# Patient Record
Sex: Male | Born: 1990 | Race: Black or African American | Hispanic: No | Marital: Single | Smoking: Current every day smoker
Health system: Southern US, Community
[De-identification: ages and names within clinical notes are randomized; demographics above are authoritative.]

## PROBLEM LIST (undated history)

## (undated) DIAGNOSIS — F259 Schizoaffective disorder, unspecified: Secondary | ICD-10-CM

## (undated) DIAGNOSIS — F250 Schizoaffective disorder, bipolar type: Secondary | ICD-10-CM

## (undated) HISTORY — PX: SKIN GRAFT: SHX250

---
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
## 2008-08-15 ENCOUNTER — Emergency Department: Payer: Self-pay | Admitting: Emergency Medicine

## 2016-03-24 ENCOUNTER — Emergency Department
Admission: EM | Admit: 2016-03-24 | Discharge: 2016-03-25 | Disposition: A | Discharge status: Other Acute Inpatient Hospital | Payer: Medicaid Other | Attending: Emergency Medicine | Admitting: Emergency Medicine

## 2016-03-24 DIAGNOSIS — F250 Schizoaffective disorder, bipolar type: Secondary | ICD-10-CM | POA: Diagnosis not present

## 2016-03-24 DIAGNOSIS — Z046 Encounter for general psychiatric examination, requested by authority: Secondary | ICD-10-CM

## 2016-03-24 DIAGNOSIS — F209 Schizophrenia, unspecified: Secondary | ICD-10-CM | POA: Diagnosis not present

## 2016-03-24 DIAGNOSIS — R441 Visual hallucinations: Secondary | ICD-10-CM | POA: Diagnosis present

## 2016-03-24 DIAGNOSIS — F17210 Nicotine dependence, cigarettes, uncomplicated: Secondary | ICD-10-CM | POA: Diagnosis not present

## 2016-03-24 DIAGNOSIS — Z79899 Other long term (current) drug therapy: Secondary | ICD-10-CM | POA: Insufficient documentation

## 2016-03-24 HISTORY — DX: Schizoaffective disorder, bipolar type: F25.0

## 2016-03-24 HISTORY — DX: Schizoaffective disorder, unspecified: F25.9

## 2016-03-24 LAB — CBC
HCT: 45.9 % (ref 40.0–52.0)
HEMOGLOBIN: 15 g/dL (ref 13.0–18.0)
MCH: 26.8 pg (ref 26.0–34.0)
MCHC: 32.7 g/dL (ref 32.0–36.0)
MCV: 81.9 fL (ref 80.0–100.0)
Platelets: 286 10*3/uL (ref 150–440)
RBC: 5.6 MIL/uL (ref 4.40–5.90)
RDW: 14.7 % — ABNORMAL HIGH (ref 11.5–14.5)
WBC: 12.8 10*3/uL — AB (ref 3.8–10.6)

## 2016-03-24 LAB — COMPREHENSIVE METABOLIC PANEL
ALBUMIN: 5.2 g/dL — AB (ref 3.5–5.0)
ALT: 31 U/L (ref 17–63)
ANION GAP: 10 (ref 5–15)
AST: 57 U/L — ABNORMAL HIGH (ref 15–41)
Alkaline Phosphatase: 71 U/L (ref 38–126)
BILIRUBIN TOTAL: 1 mg/dL (ref 0.3–1.2)
BUN: 19 mg/dL (ref 6–20)
CO2: 25 mmol/L (ref 22–32)
Calcium: 10 mg/dL (ref 8.9–10.3)
Chloride: 104 mmol/L (ref 101–111)
Creatinine, Ser: 1.4 mg/dL — ABNORMAL HIGH (ref 0.61–1.24)
GFR calc Af Amer: >60 mL/min (ref >60)
GLUCOSE: 105 mg/dL — AB (ref 65–99)
POTASSIUM: 3.9 mmol/L (ref 3.5–5.1)
Sodium: 139 mmol/L (ref 135–145)
TOTAL PROTEIN: 8.6 g/dL — AB (ref 6.5–8.1)

## 2016-03-24 LAB — ETHANOL

## 2016-03-24 LAB — LITHIUM LEVEL: LITHIUM LVL: 0.1 mmol/L — AB (ref 0.60–1.20)

## 2016-03-24 MED ORDER — RISPERIDONE 1 MG PO TABS
1.0000 mg | ORAL_TABLET | ORAL | Status: DC | PRN
  Administered 2016-03-24 – 2016-03-25 (×2): 1 mg via ORAL
  Filled 2016-03-24 (×3): qty 1

## 2016-03-24 MED ORDER — BENZTROPINE MESYLATE 0.5 MG PO TABS: 1.0000 mg | ORAL_TABLET | Freq: Two times a day (BID) | ORAL | Status: DC | PRN

## 2016-03-24 MED ORDER — LORAZEPAM 2 MG/ML IJ SOLN
INTRAMUSCULAR | Status: AC
  Administered 2016-03-24: 2 mg via INTRAMUSCULAR
  Filled 2016-03-24: qty 1

## 2016-03-24 MED ORDER — LITHIUM CARBONATE 300 MG PO CAPS
900.0000 mg | ORAL_CAPSULE | Freq: Every day | ORAL | Status: DC
  Administered 2016-03-24 – 2016-03-25 (×2): 900 mg via ORAL
  Filled 2016-03-24 (×2): qty 3

## 2016-03-24 MED ORDER — LORAZEPAM 2 MG/ML IJ SOLN
2.0000 mg | Freq: Once | INTRAMUSCULAR | Status: AC
  Administered 2016-03-24: 2 mg via INTRAMUSCULAR

## 2016-03-24 MED ORDER — LORAZEPAM 2 MG PO TABS
2.0000 mg | ORAL_TABLET | ORAL | Status: DC | PRN
  Administered 2016-03-24 – 2016-03-25 (×2): 2 mg via ORAL
  Filled 2016-03-24 (×2): qty 1

## 2016-03-24 MED ORDER — HALOPERIDOL LACTATE 5 MG/ML IJ SOLN
5.0000 mg | Freq: Once | INTRAMUSCULAR | Status: AC
  Administered 2016-03-24: 5 mg via INTRAMUSCULAR
  Filled 2016-03-24: qty 1

## 2016-03-24 MED ORDER — TRAZODONE HCL 50 MG PO TABS: 50.0000 mg | ORAL_TABLET | Freq: Every evening | ORAL | Status: DC | PRN

## 2016-03-24 NOTE — BH Assessment (Signed)
Assessment Note  Ernest Cook is an 25 y.o. male who presents to the ER via Law Enforcement due to going to neighbors' houses, trying to entering them.  Patient have a history of psychosis. He [facility]. Majority of the information for this assessment was provided by them. [facility] [phone]([phone]) states, they seen the patient on last Friday (03/21/2016) and he was giving his IM Injection. He takes Risperdal Consta.  [facility] also states, he hasn't slept in 7 days. When they saw him on Friday, he was behaving differently. They were unsure if he was intoxicated or if it was due to the lack of sleep.  Per [facility], patient have been stable for several years and was doing well. His current behaviors were new. He hasn't been hospitalized in approximately 2 years. Patient is currently living with friends in a trailer home. At one point he was attending the local community college to obtain his GED. To their knowledge, cannabis is the only substance he is using. However, due to his recent mental health and emotional state, they are questioning whether or not it's drug induce, if he's not taking his oral medications, or if not both.  Writer was unable to talk with the patient due to him receiving medications to help him calm down. While in the ER, patient was getting agitated with his nurse because he was needed to "go with the children" and she was prevent him to do so.   At the time of this assessment, patient's UDS hadn't resulted. BAC was negative for alcohol.  Diagnosis: Schizophrenia  Past Medical History:  Past Medical History:  Diagnosis Date  . Schizo affective schizophrenia (HCC)     History reviewed. No pertinent surgical history.  Family History: No family history on file.  Social History:  has no tobacco, alcohol, and drug history on file.  Additional Social History:  Alcohol / Drug Use Pain Medications: See PTA Prescriptions:  See PTA Over the Counter: See PTA History of alcohol / drug use?: No history of alcohol / drug abuse Longest period of sobriety (when/how long): Unknown, patient was unable to share. Negative Consequences of Use:  (Unknown) Withdrawal Symptoms:  (None)  CIWA: CIWA-Ar BP: 115/64 COWS:    Allergies: No Known Allergies  Home Medications:  (Not in a hospital admission)  OB/GYN Status:  No LMP for male patient.  General Assessment Data [facility] Assessment: In system Is this a Tele or Face-to-Face Assessment?: Face-to-Face Is this an Initial Assessment or a Re-assessment for this encounter?: Initial Assessment Marital status: Single Maiden name: n/a Is patient pregnant?: No Pregnancy Status: No Living Arrangements: Non-relatives/Friends (Lives with friend) Can pt return to current living arrangement?: Yes Admission Status: Involuntary Is patient capable of signing voluntary admission?: No Referral Source: Self/Family/Friend Insurance type: Medicaid  Medical Screening Exam [facility] ONLY) Medical Exam completed: Yes  Crisis Care Plan Living Arrangements: Non-relatives/Friends (Lives with friend) Legal Guardian: Other: (None) Name of Psychiatrist: Dr. Janean SarkSandra Simmons  [facility] [facility] Name of Therapist: [facility]  Education Status Is patient currently in school?: No Current Grade: n/a Highest grade of school patient has completed: Didn't complete High School Name of school: n/a Contact person: n/a  Risk to self with the past 6 months Suicidal Ideation: No (Unknown, patient didn't answer. Information from ACT [facility].) Has patient been a risk to self within the past 6 months prior to admission? : Other (comment) (Unknown,  patient didn't answer. Information from ACT Team.) Suicidal Intent:  (Unknown, patient didn't answer. Information from ACT Team.) Has patient had any suicidal intent within the past 6 months prior to admission? :  Other (comment) (Unknown, patient didn't answer. Information from ACT Team.) Is patient at risk for suicide?:  (Unknown, patient didn't answer. Information from ACT Team.) Suicidal Plan?:  (Unknown, patient didn't answer. Information from ACT Team.) Has patient had any suicidal plan within the past 6 months prior to admission? : Other (comment) (Unknown, patient didn't answer. Information from ACT Team.) Access to Means:  (Unknown, patient didn't answer. Information from ACT Team.) What has been your use of drugs/alcohol within the last 12 months?: None Known (Unknown, patient didn't answer. Information from ACT Team.) Previous Attempts/Gestures: No How many times?: 0 Other Self Harm Risks: Unknown, patient didn't answer. Information from ACT Team. Triggers for Past Attempts: Other (Comment) (Unknown, patient didn't answer. Information from ACT Team.) Intentional Self Injurious Behavior:  (Unknown, patient didn't answer. Information from ACT Team.) Family Suicide History:  (Unknown, patient didn't answer. Information from ACT Team.) Recent stressful life event(s): Other (Comment) (SPMI) Persecutory voices/beliefs?:  (Unknown, patient didn't answer. Information from ACT Team.) Depression:  (Unknown, patient didn't answer. Information from ACT Team.) Depression Symptoms:  (Unknown, patient didn't answer. Information from ACT Team.) Substance abuse history and/or treatment for substance abuse?: No  Risk to Others within the past 6 months Homicidal Ideation:  (Unknown, patient didn't answer. Information from ACT Team.) Does patient have any lifetime risk of violence toward others beyond the six months prior to admission? : Unknown (Unknown, patient didn't answer. Information from ACT Team.) Thoughts of Harm to Others: No (Unknown, patient didn't answer. Information from ACT Team.) Current Homicidal Intent:  (Unknown, patient didn't answer. Information from ACT Team.) Current Homicidal Plan:   (Unknown, patient didn't answer. Information from ACT Team.) Access to Homicidal Means:  (Unknown, patient didn't answer. Information from ACT Team.) Identified Victim: Unknown, patient didn't answer. Information from ACT Team. History of harm to others?: No (Unknown, patient didn't answer. Information from ACT Team.) Assessment of Violence: None Noted (Unknown, patient didn't answer. Information from ACT Team.) Violent Behavior Description: Unknown, patient didn't answer. Information from ACT Team. Does patient have access to weapons?: No (Unknown, patient didn't answer. Information from ACT Team.) Criminal Charges Pending?:  (None Known,patient didn't answer. Information from ACT Team.) Does patient have a court date: No Is patient on probation?: No  Psychosis Hallucinations: Visual, Auditory Delusions: Grandiose  Mental Status Report Appearance/Hygiene: In hospital gown, In scrubs, Unremarkable Eye Contact: Unable to Assess (UTA, info provided by ACTT. Pt giving meds to sleep) Motor Activity: Unable to assess (UTA, info provided by ACT[facility]. Pt giving meds to sleep) Speech: Unable to assess (UTA, info provided by ACT[facility]. Pt giving meds to sleep) Level of Consciousness: Unable to assess (UTA, info provided by ACTT. Pt giving meds to sleep) Mood: Other (Comment) (UTA, info provided by ACT[facility]. Pt giving meds to sleep) Affect: Unable to Assess (UTA, info provided by ACTT. Pt giving meds to sleep) Anxiety Level:  (UTA, info provided by ACT[facility]. Pt giving meds to sleep) Thought Processes: Unable to Assess (UTA, info provided by ACT[facility]. Pt giving meds to sleep) Judgement: Unable to Assess (UTA, info provided by ACTT. Pt giving meds to sleep) Orientation: Unable to assess (UTA, info provided by ACT[facility]. Pt giving meds to sleep) Obsessive Compulsive Thoughts/Behaviors: Unable to Assess (UTA, info provided by ACTT. Pt giving meds to sleep)  Cognitive  Functioning Concentration: Unable to Assess (UTA, info  provided by ACT[facility]. Pt giving meds to sleep) Memory: Unable to Assess (UTA, info provided by ACTT. Pt giving meds to sleep) IQ: Average (UTA, info provided by ACTT. Pt giving meds to sleep) Insight: Poor (UTA, info provided by ACTT. Pt giving meds to sleep) Impulse Control: Unable to Assess (UTA, info provided by ACTT. Pt giving meds to sleep) Appetite:  (UTA, info provided by ACT[facility]. Pt giving meds to sleep) Weight Loss:  (UTA, info provided by ACTT. Pt giving meds to sleep) Weight Gain:  (UTA, info provided by ACTT. Pt giving meds to sleep) Sleep: Decreased (UTA, info provided by ACTT. Pt giving meds to sleep) Total Hours of Sleep: 0 (Per ACTT, haven't sleep in 7 days) Vegetative Symptoms: Unable to Assess (UTA, info provided by ACTT. Pt giving meds to sleep)  ADLScreening [facility]) Patient's cognitive ability adequate to safely complete daily activities?: Yes (UTA, info provided by ACTT. Pt giving meds to sleep) Patient able to express need for assistance with ADLs?: Yes (UTA, info provided by ACTT. Pt giving meds to sleep) Independently performs ADLs?: Yes (appropriate for developmental age) (UTA, info provided by ACTT. Pt giving meds to sleep)  Prior Inpatient Therapy Prior Inpatient Therapy: Yes Prior Therapy Dates: Unknown (Unknown, patient didn't answer. Information from ACT Team.) Prior Therapy Facilty/Provider(s): Unknown, patient didn't answer. Information from ACT Team. Reason for Treatment: Schizophrenia  Prior Outpatient Therapy Prior Outpatient Therapy: Yes Prior Therapy Dates: Current Prior Therapy Facilty/Provider(s): [facility] Reason for Treatment: Schizophrenia Does patient have an ACCT team?: Yes Does patient have Intensive In-House Services?  : No Does patient have [facility] services? : No Does patient have P4CC services?: No  ADL Screening (condition at time of admission) Patient's cognitive ability adequate to safely complete daily  activities?: Yes (UTA, info provided by ACTT. Pt giving meds to sleep) Is the patient deaf or have difficulty hearing?: No Does the patient have difficulty seeing, even when wearing glasses/contacts?: No Does the patient have difficulty concentrating, remembering, or making decisions?: No Patient able to express need for assistance with ADLs?: Yes (UTA, info provided by ACTT. Pt giving meds to sleep) Does the patient have difficulty dressing or bathing?: No Independently performs ADLs?: Yes (appropriate for developmental age) (UTA, info provided by ACTT. Pt giving meds to sleep) Does the patient have difficulty walking or climbing stairs?: No Weakness of Legs: None Weakness of Arms/Hands: None  Home Assistive Devices/Equipment Home Assistive Devices/Equipment: None  Therapy Consults (therapy consults require a physician order) PT Evaluation Needed: No OT Evalulation Needed: No SLP Evaluation Needed: No Abuse/Neglect Assessment (Assessment to be complete while patient is alone) Physical Abuse: Denies Verbal Abuse: Denies Sexual Abuse: Denies Exploitation of patient/patient's resources: Denies Self-Neglect: Denies Values / Beliefs Cultural Requests During Hospitalization: None Spiritual Requests During Hospitalization: None Consults Spiritual Care Consult Needed: No Social Work Consult Needed: No      Additional Information 1:1 In Past 12 Months?: No CIRT Risk: No Elopement Risk: No Does patient have medical clearance?: Yes  Child/Adolescent Assessment Running Away Risk: Denies (Patient is an adult)  Disposition:  Disposition Initial Assessment Completed for this Encounter: Yes Disposition of Patient: Other dispositions (ER MD ordered Psych Consult)  On Site Evaluation by:   Reviewed with Physician:    Lilyan Gilford MS, LCAS, LPC, NCC, CCSI Therapeutic Triage Specialist 03/24/2016 12:14 PM

## 2016-03-24 NOTE — ED Notes (Signed)
Patient agitated and delusional, trying to leave and swinging staff, returns to bed with show of force, med IM without restraint.

## 2016-03-24 NOTE — ED Triage Notes (Signed)
[place] [facility] [place] for reports of hallucinations and possible prescription medication non-compliance. Deputy Sheriff stated the pt was walking down the road and up to a random house thinking his daughter was in the house, but the homeowner had no idea who the pt was. [facility] was contacted and IVC papers were taken out by the [facility].

## 2016-03-24 NOTE — ED Notes (Addendum)
Pt dressed out in maroon scrubs by this RN. Belongings searched for weapons/contraband, with none being found. Pt's belongings include 1 pair of sandals, 1 pair of dark blue sweat pants, 1 key chain, and some loose change. Pt's personal items placed in belongings bag and labeled.

## 2016-03-24 NOTE — ED Notes (Signed)
BEHAVIORAL HEALTH ROUNDING Patient sleeping: Yes.   Patient alert and oriented: not applicable Behavior appropriate: Yes.  ; If no, describe:  Nutrition and fluids offered: No Toileting and hygiene offered: No Sitter present: not applicable Law enforcement present: Yes  

## 2016-03-24 NOTE — ED Notes (Signed)
ENVIRONMENTAL ASSESSMENT  Potentially harmful objects out of patient reach: Yes.  Personal belongings secured: Yes.  Patient dressed in hospital provided attire only: Yes.  Plastic bags out of patient reach: Yes.  Patient care equipment (cords, cables, call bells, lines, and drains) shortened, removed, or accounted for: Yes.  Equipment and supplies removed from bottom of stretcher: Yes.  Potentially toxic materials out of patient reach: Yes.  Sharps container removed or out of patient reach: Yes.   BEHAVIORAL HEALTH ROUNDING  Patient sleeping: No.  Patient alert and oriented: yes  Behavior appropriate: Yes. ; If no, describe:  Nutrition and fluids offered: Yes  Toileting and hygiene offered: Yes  Sitter present: not applicable, Q 15 min safety rounds and observation.  Law enforcement present: Yes ODS  [facility] [place] [facility] [place] [facility] [place] [facility] after being found walking in the road and talking about God. Pt talking to no one at this time. Pt has hx of schizophrenia.

## 2016-03-24 NOTE — ED Provider Notes (Signed)
Time Seen: Approximately 459 I have reviewed the triage notes  Chief Complaint: Hallucinations and Medical Clearance   History of Present Illness: Ernest Cook is a 25 y.o. male who presents via local police department with auditory and visual hallucinations. Patient's cooperative does have some rambling speech. His only physical complaints is that his feet are sore from walking. He denies any chest pain headache, lower abdominal pain. She has not been on his medications. He does have a history of schizophrenia   History reviewed. No pertinent past medical history.  There are no active problems to display for this patient.   History reviewed. No pertinent surgical history.  History reviewed. No pertinent surgical history.    Allergies:  Review of patient's allergies indicates not on file.  Family History: No family history on file.  Social History: Social History  Substance Use Topics  . Smoking status: Not on file  . Smokeless tobacco: Not on file  . Alcohol use Not on file     Review of Systems:   10 point review of systems was performed and was otherwise negative:  Constitutional: No fever Eyes: No visual disturbances ENT: No sore throat, ear pain Cardiac: No chest pain Respiratory: No shortness of breath, wheezing, or stridor Abdomen: No abdominal pain, no vomiting, No diarrhea Endocrine: No weight loss, No night sweats Extremities: No peripheral edema, cyanosis Skin: No rashes, easy bruising Neurologic: No focal weakness, trouble with speech or swollowing Urologic: No dysuria, Hematuria, or urinary frequency   Physical Exam:  ED Triage Vitals  Enc Vitals Group     BP 03/24/16 0430 115/64     Pulse --      Resp 03/24/16 0430 16     Temp 03/24/16 0430 98.5 F (36.9 C)     Temp Source 03/24/16 0430 Oral     SpO2 03/24/16 0430 100 %     Weight 03/24/16 0430 200 lb (90.7 kg)     Height 03/24/16 0430 5\' 10"  (1.778 m)     Head Circumference --     Peak Flow --      Pain Score 03/24/16 0431 0     Pain Loc --      Pain Edu? --      Excl. in GC? --     General: Awake , Alert , and Oriented times 3; GCS 15 Head: Normal cephalic , atraumatic Eyes: Pupils equal , round, reactive to light Nose/Throat: No nasal drainage, patent upper airway without erythema or exudate.  Neck: Supple, Full range of motion, No anterior adenopathy or palpable thyroid masses Lungs: Clear to ascultation without wheezes , rhonchi, or rales Heart: Regular rate, regular rhythm without murmurs , gallops , or rubs Abdomen: Soft, non tender without rebound, guarding , or rigidity; bowel sounds positive and symmetric in all 4 quadrants. No organomegaly .        Extremities: 2 plus symmetric pulses. No edema, clubbing or cyanosis Neurologic: normal ambulation, Motor symmetric without deficits, sensory intact Skin: warm, dry, no rashes   Labs:   All laboratory work was reviewed including any pertinent negatives or positives listed below:  Labs Reviewed  CBC - Abnormal; Notable for the following:       Result Value   WBC 12.8 (*)    RDW 14.7 (*)    All other components within normal limits  COMPREHENSIVE METABOLIC PANEL  ETHANOL  URINE DRUG SCREEN, QUALITATIVE ([facility] ONLY)       ED Course: * Patient  has consult established for psychiatry and TTS services. The patient arrives with involuntary commitment paperwork which is been upheld. The patient has had the appropriate paperwork filled out here in emergency department. As stated above he remains cooperative and is currently medically clear for psychiatric evaluation.   Clinical Course     Assessment:  Acute psychosis History of schizophrenia Noncompliant with medication      Plan:  Observed and hold for psychiatric evaluation and recommendation            Jennye Moccasin, MD 03/24/16 [phone]

## 2016-03-24 NOTE — ED Notes (Signed)
Sheriff Deputy states [place]'s [facility] [place].

## 2016-03-24 NOTE — Consult Note (Signed)
[facility] Consult   Reason for Consult:  Consult for 25 year old man with a history of schizoaffective disorder brought in by police Referring Physician:  [place] Patient Identification: Ernest Cook MRN:  [id number] Principal Diagnosis: Schizo affective schizophrenia [facility]) Diagnosis:   Patient Active Problem List   Diagnosis Date Noted  . Schizo affective schizophrenia ([place]) [F25.0] 03/24/2016  . Involuntary commitment [Z04.6] 03/24/2016    Total Time spent with patient: 1 hour  Subjective:   Ernest Cook is a 25 y.o. male patient admitted with "it's all good.".  HPI:  Patient interviewed. Chart reviewed. TTS was able to obtain recent records from his [facility] team. This 25 year old man was brought in by law enforcement after being found in public acting in a bizarre manner. Reportedly walking up to worry into other people's houses that he did not know. On interview today the patient was not able to give much useful history. He answered a couple of questions but then started making comments most of which I didn't understand but some of which seem to be hyper religious or off the topic. After a couple minutes of this he pretended to go to sleep and indicated that he did not want to talk anymore. He was not willing to answer any further questions so I don't know whether he's been using any drugs or alcohol recently. He does claim that he has been staying on his medicine. He is followed by the [employer] act team and they send documentation that he was given his long-acting risperidone shot just this past Friday 3 days ago.  Social history: Patient says he lives by himself in a house. Not sure how reliable this is.  Medical history: Evidently apart from the schizoaffective disorder no known other medical problems.  Substance abuse history: Patient didn't answer questions about this. The notes that we have do not include any mention of substance abuse  problems.  Past Psychiatric History: Patient did not answer as to when he was last in a psychiatric hospital. We do have documentation from [facility] that he is followed there and gets long-acting risperidone also was prescribed lithium. Notes included with their information indicates that he does have a history of explosiveness and anger when psychotic at times in the past. Unknown if he is ever harmed himself. Medications have included long-acting risperidone and lithium as primary medicines.  Risk to Self: Suicidal Ideation: No (Unknown, patient didn't answer. Information from ACT Team.) Suicidal Intent:  (Unknown, patient didn't answer. Information from ACT Team.) Is patient at risk for suicide?:  (Unknown, patient didn't answer. Information from ACT Team.) Suicidal Plan?:  (Unknown, patient didn't answer. Information from ACT Team.) Access to Means:  (Unknown, patient didn't answer. Information from ACT Team.) What has been your use of drugs/alcohol within the last 12 months?: None Known (Unknown, patient didn't answer. Information from ACT Team.) How many times?: 0 Other Self Harm Risks: Unknown, patient didn't answer. Information from ACT Team. Triggers for Past Attempts: Other (Comment) (Unknown, patient didn't answer. Information from ACT Team.) Intentional Self Injurious Behavior:  (Unknown, patient didn't answer. Information from ACT Team.) Risk to Others: Homicidal Ideation:  (Unknown, patient didn't answer. Information from ACT Team.) Thoughts of Harm to Others: No (Unknown, patient didn't answer. Information from ACT Team.) Current Homicidal Intent:  (Unknown, patient didn't answer. Information from ACT Team.) Current Homicidal Plan:  (Unknown, patient didn't answer. Information from ACT Team.) Access to Homicidal Means:  (Unknown, patient didn't answer.  Information from ACT Team.) Identified Victim: Unknown, patient didn't answer. Information from ACT Team. History of harm to  others?: No (Unknown, patient didn't answer. Information from ACT Team.) Assessment of Violence: None Noted (Unknown, patient didn't answer. Information from ACT Team.) Violent Behavior Description: Unknown, patient didn't answer. Information from ACT Team. Does patient have access to weapons?: No (Unknown, patient didn't answer. Information from ACT Team.) Criminal Charges Pending?:  (None Known,patient didn't answer. Information from ACT Team.) Does patient have a court date: No Prior Inpatient Therapy: Prior Inpatient Therapy: Yes Prior Therapy Dates: Unknown (Unknown, patient didn't answer. Information from ACT Team.) Prior Therapy Facilty/Provider(s): Unknown, patient didn't answer. Information from ACT Team. Reason for Treatment: Schizophrenia Prior Outpatient Therapy: Prior Outpatient Therapy: Yes Prior Therapy Dates: Current Prior Therapy Facilty/Provider(s): [place] Reason for Treatment: Schizophrenia Does patient have an ACCT team?: Yes Does patient have Intensive In-House Services?  : No Does patient have [facility] services? : No Does patient have P4CC services?: No  Past Medical History:  Past Medical History:  Diagnosis Date  . Schizo affective schizophrenia ([place])    History reviewed. No pertinent surgical history. Family History: No family history on file. Family Psychiatric  History: No information available Social History:  History  Alcohol use Not on file     History  Drug use: Unknown    Social History   Social History  . Marital status: Single    Spouse name: N/A  . Number of children: N/A  . Years of education: N/A   Social History Main Topics  . Smoking status: None  . Smokeless tobacco: None  . Alcohol use None  . Drug use: Unknown  . Sexual activity: Not Asked   Other Topics Concern  . None   Social History Narrative  . None   Additional Social History:    Allergies:  No Known Allergies  Labs:  Results for orders placed or  performed during the hospital encounter of 03/24/16 (from the past 48 hour(s))  Comprehensive metabolic panel     Status: Abnormal   Collection Time: 03/24/16  4:39 AM  Result Value Ref Range   Sodium 139 135 - 145 mmol/L   Potassium 3.9 3.5 - 5.1 mmol/L   Chloride 104 101 - 111 mmol/L   CO2 25 22 - 32 mmol/L   Glucose, Bld 105 (H) 65 - 99 mg/dL   BUN 19 6 - 20 mg/dL   Creatinine, Ser 1.40 (H) 0.61 - 1.24 mg/dL   Calcium 10.0 8.9 - 10.3 mg/dL   Total Protein 8.6 (H) 6.5 - 8.1 g/dL   Albumin 5.2 (H) 3.5 - 5.0 g/dL   AST 57 (H) 15 - 41 U/L   ALT 31 17 - 63 U/L   Alkaline Phosphatase 71 38 - 126 U/L   Total Bilirubin 1.0 0.3 - 1.2 mg/dL   GFR calc non Af Amer >60 >60 mL/min   GFR calc Af Amer >60 >60 mL/min    Comment: (NOTE) The eGFR has been calculated using the CKD EPI equation. This calculation has not been validated in all clinical situations. eGFR's persistently <60 mL/min signify possible Chronic Kidney Disease.    Anion gap 10 5 - 15  Ethanol     Status: None   Collection Time: 03/24/16  4:39 AM  Result Value Ref Range   Alcohol, Ethyl (B) <5 <5 mg/dL    Comment:        LOWEST DETECTABLE LIMIT FOR SERUM ALCOHOL IS 5 mg/dL  FOR MEDICAL PURPOSES ONLY   cbc     Status: Abnormal   Collection Time: 03/24/16  4:39 AM  Result Value Ref Range   WBC 12.8 (H) 3.8 - 10.6 K/uL   RBC 5.60 4.40 - 5.90 MIL/uL   Hemoglobin 15.0 13.0 - 18.0 g/dL   HCT 45.9 40.0 - 52.0 %   MCV 81.9 80.0 - 100.0 fL   MCH 26.8 26.0 - 34.0 pg   MCHC 32.7 32.0 - 36.0 g/dL   RDW 14.7 (H) 11.5 - 14.5 %   Platelets 286 150 - 440 K/uL  Lithium level     Status: Abnormal   Collection Time: 03/24/16  4:39 AM  Result Value Ref Range   Lithium Lvl 0.10 (L) 0.60 - 1.20 mmol/L    Current Facility-Administered Medications  Medication Dose Route Frequency Provider Last Rate Last Dose  . benztropine (COGENTIN) tablet 1 mg  1 mg Oral BID PRN Gonzella Lex, MD      . lithium carbonate capsule 900 mg  900 mg  Oral QHS Estelle Skibicki T Cleston Lautner, MD      . risperiDONE (RISPERDAL) tablet 1 mg  1 mg Oral Q4H PRN Gonzella Lex, MD      . traZODone (DESYREL) tablet 50 mg  50 mg Oral QHS PRN Gonzella Lex, MD       Current Outpatient Prescriptions  Medication Sig Dispense Refill  . atomoxetine (STRATTERA) 40 MG capsule Take 40 mg by mouth daily.    . benztropine (COGENTIN) 1 MG tablet Take 1 mg by mouth at bedtime.    . docusate sodium (COLACE) 100 MG capsule Take 100 mg by mouth daily as needed for mild constipation.    Marland Kitchen lithium carbonate 300 MG capsule Take 900 mg by mouth at bedtime.    . risperiDONE (RISPERDAL) 1 MG tablet Take 1 mg by mouth daily as needed.    . risperiDONE microspheres (RISPERDAL CONSTA) 50 MG injection Inject 50 mg into the muscle every 14 (fourteen) days.    . traZODone (DESYREL) 50 MG tablet Take 25-50 mg by mouth at bedtime as needed for sleep.      Musculoskeletal: Strength & Muscle Tone: within normal limits Gait & Station: normal Patient leans: N/A  Psychiatric Specialty Exam: Physical Exam  Nursing note and vitals reviewed. Constitutional: He appears well-developed and well-nourished.  HENT:  Head: Normocephalic and atraumatic.  Eyes: Conjunctivae are normal. Pupils are equal, round, and reactive to light.  Neck: Normal range of motion.  Cardiovascular: Normal heart sounds.   Respiratory: Effort normal. No respiratory distress.  GI: Soft.  Musculoskeletal: Normal range of motion.  Neurological: He is alert.  Skin: Skin is warm and dry.  Psychiatric: His affect is blunt. His speech is slurred. He is withdrawn. Cognition and memory are impaired. He expresses impulsivity and inappropriate judgment. He is noncommunicative.  Patient appears to be confused. Answers questions often with non-sequiturs or hyper religious statements. He is inattentive.    Review of Systems  Constitutional: Negative.   HENT: Negative.   Eyes: Negative.   Respiratory: Negative.    Cardiovascular: Negative.   Gastrointestinal: Negative.   Musculoskeletal: Negative.   Skin: Negative.   Neurological: Negative.   Psychiatric/Behavioral: Negative.  Negative for depression, hallucinations, substance abuse and suicidal ideas.    Blood pressure 115/64, temperature 98.5 F (36.9 C), temperature source Oral, resp. rate 16, height '5\' 10"'  (1.778 m), weight 90.7 kg (200 lb), SpO2 100 %.Body mass index is 28.7 kg/m.  General Appearance: Fairly Groomed  Eye Contact:  Fair  Speech:  Slurred  Volume:  Decreased  Mood:  Euthymic  Affect:  Constricted  Thought Process:  Disorganized  Orientation:  Negative  Thought Content:  Illogical and Tangential  Suicidal Thoughts:  No  Homicidal Thoughts:  No  Memory:  Immediate;   Negative Recent;   Negative Remote;   Negative  Judgement:  Impaired  Insight:  Negative  Psychomotor Activity:  Negative  Concentration:  Concentration: Poor  Recall:  Poor  Fund of Knowledge:  Fair  Language:  Fair  Akathisia:  No  Handed:  Right  AIMS (if indicated):     Assets:  Physical Health Social Support  ADL's:  Intact  Cognition:  Impaired,  Mild  Sleep:        Treatment Plan Summary: Daily contact with patient to assess and evaluate symptoms and progress in treatment, Medication management and Plan This is a 25 year old man with a history of schizoaffective disorder who apparently was displaying intrusive psychotic-like behaviors in public. On interview today he is disorganized in his thinking. Unwilling or unable to give much in the way of useful history. Rambling in his speech. So far has not been violent or threatening. Patient meets reasonable criteria for hospitalization given psychotic decompensation. I have restarted his lithium and when necessary oral risperidone and other medicines including when necessary medicine for sleep. Patient has been informed that he will be admitted to the hospital. Reviewed case with TTS and emergency  room physician. Labs reviewed.  Disposition: Recommend psychiatric Inpatient admission when medically cleared. Supportive therapy provided about ongoing stressors.  Alethia Berthold, MD 03/24/2016 3:28 PM

## 2016-03-24 NOTE — Progress Notes (Signed)
TTS attempted to conduct assessment. Patient refused to participate in the assessment and declined to answer questions of the TTS.  Patient was responding to internal stimuli, and having a full conversation.  He kept repeating the statement "Its all baby love to the TTS".

## 2016-03-24 NOTE — ED Notes (Signed)
BEHAVIORAL HEALTH ROUNDING Patient sleeping: No. Patient alert and oriented: no Behavior appropriate: No.; If no, describe: delusional Nutrition and fluids offered: Yes  Toileting and hygiene offered: Yes  Sitter present: not applicable Law enforcement present: Yes

## 2016-03-24 NOTE — ED Notes (Signed)
Up in room. Continues delusional but less hostile and easily redirected.

## 2016-03-24 NOTE — ED Notes (Signed)
Patient increasingly agitated though smiling, thinks he needs to go "with the children", continually leaving room, med for agitation

## 2016-03-24 NOTE — ED Notes (Signed)
BEHAVIORAL HEALTH ROUNDING Patient sleeping: Yes.   Patient alert and oriented: not applicable Behavior appropriate: Yes.  ; If no, describe:  Nutrition and fluids offered: Yes  Toileting and hygiene offered: Yes  Sitter present: not applicable Law enforcement present: Yes  

## 2016-03-24 NOTE — ED Notes (Signed)
BEHAVIORAL HEALTH ROUNDING Patient sleeping: No. Patient alert and oriented: no Behavior appropriate: No.; If no, describe: delusional Nutrition and fluids offered: Yes  Toileting and hygiene offered: Yes  Sitter present: not applicable Law enforcement present: Yes  

## 2016-03-24 NOTE — ED Notes (Signed)
BEHAVIORAL HEALTH ROUNDING  Patient sleeping: No.  Patient alert and oriented: yes  Behavior appropriate: Yes. ; If no, describe:  Nutrition and fluids offered: Yes  Toileting and hygiene offered: Yes  Sitter present: not applicable, Q 15 min safety rounds and observation.  Law enforcement present: Yes ODS  

## 2016-03-24 NOTE — ED Notes (Signed)
BEHAVIORAL HEALTH ROUNDING Patient sleeping: No. Patient alert and oriented: no Behavior appropriate: No.; If no, describe: delusional and disoriented Nutrition and fluids offered: Yes  Toileting and hygiene offered: Yes  Sitter present: not applicable Law enforcement present: Yes

## 2016-03-24 NOTE — BH Assessment (Signed)
Per Dr. Toni Amendlapacs, patient meets inpatient criteria.  [facility] Director [facility](Antoinette B.),due to the [facility] and patient behaviors throughout the day, while in the ER, patient is unable to transfer to the unit at this time. He will be considered again on tomorrow (03/25/2016) for admission.  Writer informed the patient's nurse (Raquel) and ER Charge Nurse Sue Lush(Andrea).

## 2016-03-24 NOTE — ED Notes (Addendum)
Spoke with Darryl, pt's ACT team representative. He reports a medical history of schizophrenia, but unsure of other medical conditions or currently prescribed medications. Darryl states he will fax over what information he has available as soon as feasible. Contact # [phone]

## 2016-03-24 NOTE — ED Notes (Signed)
Received report from Selena BattenKim, Charity fundraiserN. Pt. Sleeping upon shift change, awaiting to move to BHU possibly tomorrow morning depending on his behavior tonight.   Will continue to monitor.

## 2016-03-24 NOTE — ED Notes (Signed)
Unable to question pt for triage; pt only responds with "it's only for love". Deputy states pt has only been speaking about religious topics since being taken into custody.

## 2016-03-24 NOTE — ED Provider Notes (Signed)
-----------------------------------------   8:53 AM on 03/24/2016 -----------------------------------------  Called to bedside patient acting very manic, flights of ideas hyper religiosity talking about Jesus and angels. Entering other patient's rooms. Patient was given Haldol and Ativan. He was not violent but he was confrontational towards staff and an presented a danger to himself and others that moment. We will continue to observe him closely here.   Jeanmarie PlantJames A Delwin Raczkowski, MD 03/24/16 [phone]

## 2016-03-24 NOTE — ED Notes (Signed)
Increased agitation, med po.

## 2016-03-24 NOTE — ED Notes (Signed)

## 2016-03-25 ENCOUNTER — Inpatient Hospital Stay
Admit: 2016-03-25 | Discharge: 2016-04-01 | DRG: 885 | Disposition: A | Discharge status: Home / Self Care | Payer: Medicaid Other | Source: Ambulatory Visit | Attending: Psychiatry | Admitting: Psychiatry

## 2016-03-25 DIAGNOSIS — F1220 Cannabis dependence, uncomplicated: Secondary | ICD-10-CM | POA: Diagnosis present

## 2016-03-25 DIAGNOSIS — F250 Schizoaffective disorder, bipolar type: Principal | ICD-10-CM | POA: Diagnosis present

## 2016-03-25 DIAGNOSIS — F17200 Nicotine dependence, unspecified, uncomplicated: Secondary | ICD-10-CM | POA: Diagnosis present

## 2016-03-25 DIAGNOSIS — G4700 Insomnia, unspecified: Secondary | ICD-10-CM | POA: Diagnosis present

## 2016-03-25 DIAGNOSIS — Z9119 Patient's noncompliance with other medical treatment and regimen: Secondary | ICD-10-CM

## 2016-03-25 DIAGNOSIS — Z9114 Patient's other noncompliance with medication regimen: Secondary | ICD-10-CM | POA: Diagnosis not present

## 2016-03-25 DIAGNOSIS — F17210 Nicotine dependence, cigarettes, uncomplicated: Secondary | ICD-10-CM | POA: Diagnosis present

## 2016-03-25 DIAGNOSIS — Z046 Encounter for general psychiatric examination, requested by authority: Secondary | ICD-10-CM

## 2016-03-25 DIAGNOSIS — F209 Schizophrenia, unspecified: Secondary | ICD-10-CM | POA: Diagnosis not present

## 2016-03-25 DIAGNOSIS — Z91199 Patient's noncompliance with other medical treatment and regimen due to unspecified reason: Secondary | ICD-10-CM

## 2016-03-25 LAB — URINE DRUG SCREEN, QUALITATIVE (ARMC ONLY)
AMPHETAMINES, UR SCREEN: NOT DETECTED
BENZODIAZEPINE, UR SCRN: POSITIVE — AB
Barbiturates, Ur Screen: NOT DETECTED
Cannabinoid 50 Ng, Ur ~~LOC~~: POSITIVE — AB
Cocaine Metabolite,Ur ~~LOC~~: NOT DETECTED
MDMA (ECSTASY) UR SCREEN: NOT DETECTED
Methadone Scn, Ur: NOT DETECTED
Opiate, Ur Screen: NOT DETECTED
Phencyclidine (PCP) Ur S: NOT DETECTED
TRICYCLIC, UR SCREEN: NOT DETECTED

## 2016-03-25 MED ORDER — TRAZODONE HCL 50 MG PO TABS: 50.0000 mg | ORAL_TABLET | Freq: Every evening | ORAL | Status: DC | PRN

## 2016-03-25 MED ORDER — LITHIUM CARBONATE 300 MG PO CAPS: 900.0000 mg | ORAL_CAPSULE | Freq: Every day | ORAL | Status: DC

## 2016-03-25 MED ORDER — ALUM & MAG HYDROXIDE-SIMETH 200-200-20 MG/5ML PO SUSP: 30.0000 mL | ORAL | Status: DC | PRN

## 2016-03-25 MED ORDER — MAGNESIUM HYDROXIDE 400 MG/5ML PO SUSP: 30.0000 mL | Freq: Every day | ORAL | Status: DC | PRN

## 2016-03-25 MED ORDER — RISPERIDONE 1 MG PO TABS
1.0000 mg | ORAL_TABLET | ORAL | Status: DC | PRN
  Filled 2016-03-25: qty 1

## 2016-03-25 MED ORDER — LORAZEPAM 2 MG PO TABS
2.0000 mg | ORAL_TABLET | ORAL | Status: DC | PRN
  Administered 2016-03-26 (×2): 2 mg via ORAL
  Filled 2016-03-25 (×2): qty 1

## 2016-03-25 MED ORDER — RISPERIDONE 1 MG PO TABS
1.0000 mg | ORAL_TABLET | Freq: Two times a day (BID) | ORAL | Status: DC
  Administered 2016-03-26: 1 mg via ORAL
  Filled 2016-03-25: qty 1

## 2016-03-25 MED ORDER — ACETAMINOPHEN 325 MG PO TABS: 650.0000 mg | ORAL_TABLET | Freq: Four times a day (QID) | ORAL | Status: DC | PRN

## 2016-03-25 MED ORDER — BENZTROPINE MESYLATE 1 MG PO TABS
1.0000 mg | ORAL_TABLET | Freq: Two times a day (BID) | ORAL | Status: DC | PRN
  Administered 2016-03-29: 1 mg via ORAL
  Filled 2016-03-25: qty 1

## 2016-03-25 MED ORDER — RISPERIDONE 1 MG PO TABS
1.0000 mg | ORAL_TABLET | Freq: Two times a day (BID) | ORAL | Status: DC
  Administered 2016-03-25: 1 mg via ORAL

## 2016-03-25 NOTE — ED Notes (Signed)
Patient up to restroom at this time

## 2016-03-25 NOTE — ED Notes (Signed)
Patient in bathroom at this time.

## 2016-03-25 NOTE — BH Assessment (Signed)
[facility] Dr. Toni Amendlapacs.  Attending Physician will be Dr. Jennet MaduroPucilowska.   Patient has been [facility] to room 311, [facility] Abi.   Intake Paper Work has been signed and placed on patient chart.  ER staff is aware of the admission (Dr. Scotty CourtStafford,, ER MD; Erie NoeVanessa, Patient's Nurse & Nicole Cellaorothy, Patient Access).

## 2016-03-25 NOTE — ED Notes (Signed)
Ernest DraftsLaura Cook [facility] here to visit pt. She said she would talk to pt's doctor and have her call Dr. Toni Amendlapacs.

## 2016-03-25 NOTE — Consult Note (Signed)
[facility] Consult   Reason for Consult:  Consult for 25 year old man with a history of schizoaffective disorder brought in by police Referring Physician:  McShane Patient Identification: Ernest Cook MRN:  [id number] Principal Diagnosis: Schizo affective schizophrenia [facility]) Diagnosis:   Patient Active Problem List   Diagnosis Date Noted  . Schizo affective schizophrenia ([place]) [F25.0] 03/24/2016  . Involuntary commitment [Z04.6] 03/24/2016    Total Time spent with patient: 1 hour  Subjective:   Ernest Cook is a 25 y.o. male patient admitted with "it's all good.".   Follow-up for this 25 year old man with a history of schizophrenia or schizoaffective disorder. Patient was seen yesterday in the emergency room after being brought into the hospital psychotic. Decision was made to admit him to the psychiatric hospital. Concern was expressed by treatment team about possible agitation. On reevaluation today the patient denies any wish to harm anyone. Today he has not been physically aggressive although at times he has been walking out of his room. Has responded okay to verbal redirection. On at least one occasion earlier today I heard him make a whooping sound but reviewed today he is calm. Thoughts are still disorganized. Affect euthymic. Has been taking medication  HPI:  Patient interviewed. Chart reviewed. TTS was able to obtain recent records from his [place] team. This 25 year old man was brought in by law enforcement after being found in public acting in a bizarre manner. Reportedly walking up to worry into other people's houses that he did not know. On interview today the patient was not able to give much useful history. He answered a couple of questions but then started making comments most of which I didn't understand but some of which seem to be hyper religious or off the topic. After a couple minutes of this he pretended to go to sleep and indicated that  he did not want to talk anymore. He was not willing to answer any further questions so I don't know whether he's been using any drugs or alcohol recently. He does claim that he has been staying on his medicine. He is followed by the [employer] act team and they send documentation that he was given his long-acting risperidone shot just this past Friday 3 days ago.  Social history: Patient says he lives by himself in a house. Not sure how reliable this is.  Medical history: Evidently apart from the schizoaffective disorder no known other medical problems.  Substance abuse history: Patient didn't answer questions about this. The notes that we have do not include any mention of substance abuse problems.  Past Psychiatric History: Patient did not answer as to when he was last in a psychiatric hospital. We do have documentation from [facility] that he is followed there and gets long-acting risperidone also was prescribed lithium. Notes included with their information indicates that he does have a history of explosiveness and anger when psychotic at times in the past. Unknown if he is ever harmed himself. Medications have included long-acting risperidone and lithium as primary medicines.  Risk to Self: Suicidal Ideation: No (Unknown, patient didn't answer. Information from ACT Team.) Suicidal Intent:  (Unknown, patient didn't answer. Information from ACT Team.) Is patient at risk for suicide?:  (Unknown, patient didn't answer. Information from ACT Team.) Suicidal Plan?:  (Unknown, patient didn't answer. Information from ACT Team.) Access to Means:  (Unknown, patient didn't answer. Information from ACT Team.) What has been your use of drugs/alcohol within the last 12 months?:  None Known (Unknown, patient didn't answer. Information from ACT Team.) How many times?: 0 Other Self Harm Risks: Unknown, patient didn't answer. Information from ACT Team. Triggers for Past Attempts: Other (Comment) (Unknown,  patient didn't answer. Information from ACT Team.) Intentional Self Injurious Behavior:  (Unknown, patient didn't answer. Information from ACT Team.) Risk to Others: Homicidal Ideation:  (Unknown, patient didn't answer. Information from ACT Team.) Thoughts of Harm to Others: No (Unknown, patient didn't answer. Information from ACT Team.) Current Homicidal Intent:  (Unknown, patient didn't answer. Information from ACT Team.) Current Homicidal Plan:  (Unknown, patient didn't answer. Information from ACT Team.) Access to Homicidal Means:  (Unknown, patient didn't answer. Information from ACT Team.) Identified Victim: Unknown, patient didn't answer. Information from ACT Team. History of harm to others?: No (Unknown, patient didn't answer. Information from ACT Team.) Assessment of Violence: None Noted (Unknown, patient didn't answer. Information from ACT Team.) Violent Behavior Description: Unknown, patient didn't answer. Information from ACT Team. Does patient have access to weapons?: No (Unknown, patient didn't answer. Information from ACT Team.) Criminal Charges Pending?:  (None Known,patient didn't answer. Information from ACT Team.) Does patient have a court date: No Prior Inpatient Therapy: Prior Inpatient Therapy: Yes Prior Therapy Dates: Unknown (Unknown, patient didn't answer. Information from ACT Team.) Prior Therapy Facilty/Provider(s): Unknown, patient didn't answer. Information from ACT Team. Reason for Treatment: Schizophrenia Prior Outpatient Therapy: Prior Outpatient Therapy: Yes Prior Therapy Dates: Current Prior Therapy Facilty/Provider(s): [place] Reason for Treatment: Schizophrenia Does patient have an ACCT team?: Yes Does patient have Intensive In-House Services?  : No Does patient have [facility] services? : No Does patient have P4CC services?: No  Past Medical History:  Past Medical History:  Diagnosis Date  . Schizo affective schizophrenia ([place])    History  reviewed. No pertinent surgical history. Family History: No family history on file. Family Psychiatric  History: No information available Social History:  History  Alcohol use Not on file     History  Drug use: Unknown    Social History   Social History  . Marital status: Single    Spouse name: N/A  . Number of children: N/A  . Years of education: N/A   Social History Main Topics  . Smoking status: None  . Smokeless tobacco: None  . Alcohol use None  . Drug use: Unknown  . Sexual activity: Not Asked   Other Topics Concern  . None   Social History Narrative  . None   Additional Social History:    Allergies:  No Known Allergies  Labs:  Results for orders placed or performed during the hospital encounter of 03/24/16 (from the past 48 hour(s))  Comprehensive metabolic panel     Status: Abnormal   Collection Time: 03/24/16  4:39 AM  Result Value Ref Range   Sodium 139 135 - 145 mmol/L   Potassium 3.9 3.5 - 5.1 mmol/L   Chloride 104 101 - 111 mmol/L   CO2 25 22 - 32 mmol/L   Glucose, Bld 105 (H) 65 - 99 mg/dL   BUN 19 6 - 20 mg/dL   Creatinine, Ser 1.40 (H) 0.61 - 1.24 mg/dL   Calcium 10.0 8.9 - 10.3 mg/dL   Total Protein 8.6 (H) 6.5 - 8.1 g/dL   Albumin 5.2 (H) 3.5 - 5.0 g/dL   AST 57 (H) 15 - 41 U/L   ALT 31 17 - 63 U/L   Alkaline Phosphatase 71 38 - 126 U/L   Total Bilirubin 1.0 0.3 -  1.2 mg/dL   GFR calc non Af Amer >60 >60 mL/min   GFR calc Af Amer >60 >60 mL/min    Comment: (NOTE) The eGFR has been calculated using the CKD EPI equation. This calculation has not been validated in all clinical situations. eGFR's persistently <60 mL/min signify possible Chronic Kidney Disease.    Anion gap 10 5 - 15  Ethanol     Status: None   Collection Time: 03/24/16  4:39 AM  Result Value Ref Range   Alcohol, Ethyl (B) <5 <5 mg/dL    Comment:        LOWEST DETECTABLE LIMIT FOR SERUM ALCOHOL IS 5 mg/dL FOR MEDICAL PURPOSES ONLY   cbc     Status: Abnormal    Collection Time: 03/24/16  4:39 AM  Result Value Ref Range   WBC 12.8 (H) 3.8 - 10.6 K/uL   RBC 5.60 4.40 - 5.90 MIL/uL   Hemoglobin 15.0 13.0 - 18.0 g/dL   HCT 45.9 40.0 - 52.0 %   MCV 81.9 80.0 - 100.0 fL   MCH 26.8 26.0 - 34.0 pg   MCHC 32.7 32.0 - 36.0 g/dL   RDW 14.7 (H) 11.5 - 14.5 %   Platelets 286 150 - 440 K/uL  Lithium level     Status: Abnormal   Collection Time: 03/24/16  4:39 AM  Result Value Ref Range   Lithium Lvl 0.10 (L) 0.60 - 1.20 mmol/L  Urine Drug Screen, Qualitative     Status: Abnormal   Collection Time: 03/24/16  7:24 AM  Result Value Ref Range   Tricyclic, Ur Screen NONE DETECTED NONE DETECTED   Amphetamines, Ur Screen NONE DETECTED NONE DETECTED   MDMA (Ecstasy)Ur Screen NONE DETECTED NONE DETECTED   Cocaine Metabolite,Ur [place] NONE DETECTED NONE DETECTED   Opiate, Ur Screen NONE DETECTED NONE DETECTED   Phencyclidine (PCP) Ur S NONE DETECTED NONE DETECTED   Cannabinoid 50 Ng, Ur [place] POSITIVE (A) NONE DETECTED   Barbiturates, Ur Screen NONE DETECTED NONE DETECTED   Benzodiazepine, Ur Scrn POSITIVE (A) NONE DETECTED   Methadone Scn, Ur NONE DETECTED NONE DETECTED    Comment: (NOTE) 449  Tricyclics, urine               Cutoff 1000 ng/mL 200  Amphetamines, urine             Cutoff 1000 ng/mL 300  MDMA (Ecstasy), urine           Cutoff 500 ng/mL 400  Cocaine Metabolite, urine       Cutoff 300 ng/mL 500  Opiate, urine                   Cutoff 300 ng/mL 600  Phencyclidine (PCP), urine      Cutoff 25 ng/mL 700  Cannabinoid, urine              Cutoff 50 ng/mL 800  Barbiturates, urine             Cutoff 200 ng/mL 900  Benzodiazepine, urine           Cutoff 200 ng/mL 1000 Methadone, urine                Cutoff 300 ng/mL 1100 1200 The urine drug screen provides only a preliminary, unconfirmed 1300 analytical test result and should not be used for non-medical 1400 purposes. Clinical consideration and professional judgment should 1500 be applied to any positive  drug screen result due to possible 1600 interfering  substances. A more specific alternate chemical method 1700 must be used in order to obtain a confirmed analytical result.  1800 Gas chromato graphy / mass spectrometry (GC/MS) is the preferred 1900 confirmatory method.     Current Facility-Administered Medications  Medication Dose Route Frequency Provider Last Rate Last Dose  . benztropine (COGENTIN) tablet 1 mg  1 mg Oral BID PRN Gonzella Lex, MD      . lithium carbonate capsule 900 mg  900 mg Oral QHS Gonzella Lex, MD   900 mg at 03/24/16 2149  . LORazepam (ATIVAN) tablet 2 mg  2 mg Oral Q4H PRN Gonzella Lex, MD   2 mg at 03/25/16 1109  . risperiDONE (RISPERDAL) tablet 1 mg  1 mg Oral Q4H PRN Gonzella Lex, MD   1 mg at 03/25/16 1445  . risperiDONE (RISPERDAL) tablet 1 mg  1 mg Oral BID Gonzella Lex, MD      . traZODone (DESYREL) tablet 50 mg  50 mg Oral QHS PRN Gonzella Lex, MD       Current Outpatient Prescriptions  Medication Sig Dispense Refill  . atomoxetine (STRATTERA) 40 MG capsule Take 40 mg by mouth daily.    . benztropine (COGENTIN) 1 MG tablet Take 1 mg by mouth at bedtime.    . docusate sodium (COLACE) 100 MG capsule Take 100 mg by mouth daily as needed for mild constipation.    Marland Kitchen lithium carbonate 300 MG capsule Take 900 mg by mouth at bedtime.    . risperiDONE (RISPERDAL) 1 MG tablet Take 1 mg by mouth daily as needed.    . risperiDONE microspheres (RISPERDAL CONSTA) 50 MG injection Inject 50 mg into the muscle every 14 (fourteen) days.    . traZODone (DESYREL) 50 MG tablet Take 25-50 mg by mouth at bedtime as needed for sleep.      Musculoskeletal: Strength & Muscle Tone: within normal limits Gait & Station: normal Patient leans: N/A  Psychiatric Specialty Exam: Physical Exam  Nursing note and vitals reviewed. Constitutional: He appears well-developed and well-nourished.  HENT:  Head: Normocephalic and atraumatic.  Eyes: Conjunctivae are normal.  Pupils are equal, round, and reactive to light.  Neck: Normal range of motion.  Cardiovascular: Normal heart sounds.   Respiratory: Effort normal. No respiratory distress.  GI: Soft.  Musculoskeletal: Normal range of motion.  Neurological: He is alert.  Skin: Skin is warm and dry.  Psychiatric: His affect is blunt. His speech is slurred. He is withdrawn. Cognition and memory are impaired. He expresses impulsivity and inappropriate judgment. He is noncommunicative.  Patient appears to be confused. Answers questions often with non-sequiturs or hyper religious statements. He is inattentive.    Review of Systems  Constitutional: Negative.   HENT: Negative.   Eyes: Negative.   Respiratory: Negative.   Cardiovascular: Negative.   Gastrointestinal: Negative.   Musculoskeletal: Negative.   Skin: Negative.   Neurological: Negative.   Psychiatric/Behavioral: Negative.  Negative for depression, hallucinations, substance abuse and suicidal ideas.    Blood pressure (!) 139/92, pulse 90, temperature 98 F (36.7 C), temperature source Oral, resp. rate 18, height '5\' 10"'  (1.778 m), weight 90.7 kg (200 lb), SpO2 100 %.Body mass index is 28.7 kg/m.  General Appearance: Fairly Groomed  Eye Contact:  Fair  Speech:  Slurred  Volume:  Decreased  Mood:  Euthymic  Affect:  Constricted  Thought Process:  Disorganized  Orientation:  Negative  Thought Content:  Illogical and Tangential  Suicidal Thoughts:  No  Homicidal Thoughts:  No  Memory:  Immediate;   Negative Recent;   Negative Remote;   Negative  Judgement:  Impaired  Insight:  Negative  Psychomotor Activity:  Negative  Concentration:  Concentration: Poor  Recall:  Poor  Fund of Knowledge:  Fair  Language:  Fair  Akathisia:  No  Handed:  Right  AIMS (if indicated):     Assets:  Physical Health Social Support  ADL's:  Intact  Cognition:  Impaired,  Mild  Sleep:        Treatment Plan Summary: Daily contact with patient to assess  and evaluate symptoms and progress in treatment, Medication management and Plan Patient was schizoaffective disorder. Medicine compliant in the hospital. Calm her now. Not requiring one-to-one attention. Not aggressive or violent today. My thought would be that he would be appropriate to admit to the psychiatric ward at this point. I have added a low dose of standing risperidone for psychosis. Continue other medicine. Orders are still in place for admission. Case reviewed with TTS and emergency room doctor.  Disposition: Recommend psychiatric Inpatient admission when medically cleared. Supportive therapy provided about ongoing stressors.  Alethia Berthold, MD 03/25/2016 5:16 PM

## 2016-03-25 NOTE — ED Notes (Signed)
Pt called me to room. States that he wants to leave er now. I told him he was going to go to the behavioral unit sometime today. Pt stated that he wanted to go to BMU now. Said sitting around was making him anxious. I asked him if he needed medication to help with his anxiety. He said he wanted med and ativan  given.

## 2016-03-25 NOTE — ED Provider Notes (Signed)
-----------------------------------------   6:45 AM on 03/25/2016 -----------------------------------------   Blood pressure 139/82, pulse (!) 101, temperature 98.4 F (36.9 C), resp. rate 18, height 5\' 10"  (1.778 m), weight 200 lb (90.7 kg), SpO2 100 %.  The patient had no acute events since last update.  Calm and cooperative at this time.  Patient was accepted to the behavioral medicine unit pending bed availability.   Irean HongJade J Sung, MD 03/25/16 [phone]

## 2016-03-25 NOTE — ED Notes (Signed)
Pt with increasing religious speech, pacing, anxiety and agitation. Pt has not been aggressive. Risperidone  given.

## 2016-03-26 DIAGNOSIS — F1220 Cannabis dependence, uncomplicated: Secondary | ICD-10-CM | POA: Diagnosis present

## 2016-03-26 DIAGNOSIS — F250 Schizoaffective disorder, bipolar type: Principal | ICD-10-CM

## 2016-03-26 DIAGNOSIS — F17200 Nicotine dependence, unspecified, uncomplicated: Secondary | ICD-10-CM | POA: Diagnosis present

## 2016-03-26 LAB — HEMOGLOBIN A1C: Hgb A1c MFr Bld: 6 % (ref 4.0–6.0)

## 2016-03-26 LAB — TSH: TSH: 1.818 u[IU]/mL (ref 0.350–4.500)

## 2016-03-26 LAB — LIPID PANEL
Cholesterol: 158 mg/dL (ref 0–200)
HDL: 29 mg/dL — ABNORMAL LOW (ref >40)
LDL CALC: 102 mg/dL — AB (ref 0–99)
Total CHOL/HDL Ratio: 5.4 RATIO
Triglycerides: 134 mg/dL (ref <150)
VLDL: 27 mg/dL (ref 0–40)

## 2016-03-26 MED ORDER — RISPERIDONE 1 MG PO TABS
2.0000 mg | ORAL_TABLET | Freq: Two times a day (BID) | ORAL | Status: DC
  Administered 2016-03-26 – 2016-04-01 (×11): 2 mg via ORAL
  Filled 2016-03-26 (×10): qty 2

## 2016-03-26 MED ORDER — TEMAZEPAM 15 MG PO CAPS
15.0000 mg | ORAL_CAPSULE | Freq: Every day | ORAL | Status: DC
  Administered 2016-03-27 – 2016-03-31 (×5): 15 mg via ORAL
  Filled 2016-03-26 (×5): qty 1

## 2016-03-26 MED ORDER — LITHIUM CARBONATE 300 MG PO CAPS
600.0000 mg | ORAL_CAPSULE | Freq: Three times a day (TID) | ORAL | Status: DC
  Administered 2016-03-26 – 2016-03-27 (×4): 600 mg via ORAL
  Filled 2016-03-26 (×5): qty 2

## 2016-03-26 MED ORDER — NICOTINE 21 MG/24HR TD PT24
21.0000 mg | MEDICATED_PATCH | Freq: Every day | TRANSDERMAL | Status: DC
  Filled 2016-03-26 (×3): qty 1

## 2016-03-26 NOTE — [facility] Group Notes (Signed)
[facility] Group Notes:  (Nursing/MHT/Case Management/Adjunct)  Date:  03/26/2016  Time:  4:54 PM  Type of Therapy:  Psychoeducational Skills  Participation Level:  Active  Participation Quality:  Appropriate  Affect:  Appropriate  Cognitive:  Appropriate  Insight:  Appropriate  Engagement in Group:  Engaged and Supportive  Modes of Intervention:  Discussion and Education  Summary of Progress/Problems:  Noelle PennerKristen J Damia Bobrowski 03/26/2016, 4:54 PM

## 2016-03-26 NOTE — Progress Notes (Signed)
Recreation Therapy Notes  Date: 08.09.17 Time: 9:30 am Location: Craft Room  Group Topic: Self-esteem  Goal Area(s) Addresses:  Patient will write at least one positive trait about self. Patient will verbalize benefit of having healthy self-esteem.  Behavioral Response: Attentive, Interactive  Intervention: I Am  Activity: Patients were given a worksheet with the letter I on it and instructed to write as many positive traits about themselves inside the letter.  Education: LRT educated patients on ways they can increase their self-esteem.  Education Outcome: Acknowledges education/In group clarification offered   Clinical Observations/Feedback: Patient wrote names and drew on his worksheet. Patient contributed to group discussion by stating how he can increase his self-esteem. Patient talked about his dream of having a lot of dogs and rolling around the yard with them.  Jacquelynn CreeGreene,Ezell Melikian M, LRT/CTRS 03/26/2016 10:32 AM
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
## 2016-03-26 NOTE — Progress Notes (Signed)
Patient with increasing agitation and Ativan 2 mg po prn administered at this time.
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
## 2016-03-26 NOTE — Tx Team (Signed)
Initial Interdisciplinary Treatment Plan   PATIENT STRESSORS: Substance abuse   PATIENT STRENGTHS: Capable of independent living General fund of knowledge Physical Health   PROBLEM LIST: Problem List/Patient Goals Date to be addressed Date deferred Reason deferred Estimated date of resolution  Psychosis 03/25/16     Aggression 03/25/16           "just getting myself together and going home" 03/25/16                                    DISCHARGE CRITERIA:  Improved stabilization in mood, thinking, and/or behavior Motivation to continue treatment in a less acute level of care Need for constant or close observation no longer present Reduction of life-threatening or endangering symptoms to within safe limits Verbal commitment to aftercare and medication compliance  PRELIMINARY DISCHARGE PLAN: Attend aftercare/continuing care group Outpatient therapy  PATIENT/FAMIILY INVOLVEMENT: This treatment plan has been presented to and reviewed with the patient, Oneita Krasevis M Steiner, and/or family member.  The patient and family have been given the opportunity to ask questions and make suggestions.  Chancey Cullinane L Parsells 03/26/2016, 1:55 AM

## 2016-03-26 NOTE — Plan of Care (Signed)
Problem: Education: Goal: Emotional status will improve Outcome: Not Progressing Patient new admit as of late last night and patient states his mood is bette and he is ready to discharge. Patient with poor insight rt issues rt admission.

## 2016-03-26 NOTE — [facility] Suicide Risk Assessment (Signed)
CSW attempted to contact pt's aunt, Frazier Richardslene Russell @ [phone]. CSW left voicemail and is awaiting call back.   Lynden OxfordKadijah R. Maron Stanzione, MSW, LCSW-A 03/26/16  10:16AM
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
## 2016-03-26 NOTE — H&P (Signed)
Psychiatric Admission Assessment Adult  Patient Identification: Ernest Cook MRN:  [id number] Date of Evaluation:  03/26/2016 Chief Complaint:  Schizophrenia Principal Diagnosis: Schizoaffective disorder, bipolar type (HCC) Diagnosis:   Patient Active Problem List   Diagnosis Date Noted  . Schizoaffective disorder, bipolar type (HCC) [F25.0] 03/25/2016    Priority: High  . Cannabis use disorder, moderate, dependence (HCC) [F12.20] 03/26/2016  . Tobacco use disorder [F17.200] 03/26/2016  . Involuntary commitment [Z04.6] 03/24/2016   History of Present Illness:   Identifying data. Ernest Cook is a 25 year old male with a history of schizophrenia.  Chief complaint. "It's for love."  History of present illness. Information was obtained from the patient and the chart. The patient has a long history of schizophrenia but has not been able to provide any details as he is a poor historian and preoccupied with his delusions. He was brought to the emergency room by police after he was found walking to other people's houses looking for his daughter. They were all strangers and reported him to the police. In the emergency room the patient was initially agitated and was held in the ER for couple days before he settled down. He admits that for the past 2 weeks he has not been taking his lithium. He cannot explain why. Lithium level on admission was very low. He has been on Risperdal injections and reportedly got his last injection 3 days prior to admission. He complains that Risperdal has not been working and that he needs to take pills on the top of injections. He denies any symptoms of depression, anxiety, or psychosis. He explains that he is looking for love, and this is all that matters. He is a homosexual and has not been in a relationship. He was positive for marijuana on admission but denies using alcohol or other illicit drugs.  Past psychiatric history. The patient is unable to provide any details.  We know that he has [facility] ACT team and has been on lithium and Risperdal. He is unable to provide any information about his hospitalizations.  Family psychiatric history. Unknown.  Social history. He is disabled from mental illness. He lives independently in a house in [place]. He is gay. He has support from his mother and father as well as his ACT team.  Total Time spent with patient: 1 hour  Is the patient at risk to self? No.  Has the patient been a risk to self in the past 6 months? No.  Has the patient been a risk to self within the distant past? No.  Is the patient a risk to others? No.  Has the patient been a risk to others in the past 6 months? No.  Has the patient been a risk to others within the distant past? No.   Prior Inpatient Therapy:   Prior Outpatient Therapy:    Alcohol Screening: 1. How often do you have a drink containing alcohol?: Never 2. How many drinks containing alcohol do you have on a typical day when you are drinking?: 1 or 2 3. How often do you have six or more drinks on one occasion?: Never Preliminary Score: 0 4. How often during the last year have you found that you were not able to stop drinking once you had started?: Never 5. How often during the last year have you failed to do what was normally expected from you becasue of drinking?: Never 6. How often during the last year have you needed a first drink in the morning to  get yourself going after a heavy drinking session?: Never 7. How often during the last year have you had a feeling of guilt of remorse after drinking?: Never 8. How often during the last year have you been unable to remember what happened the night before because you had been drinking?: Never 9. Have you or someone else been injured as a result of your drinking?: No 10. Has a relative or friend or a doctor or another health worker been concerned about your drinking or suggested you cut down?: No Alcohol Use Disorder  Identification Test Final Score (AUDIT): 0 Brief Intervention: AUDIT score less than 7 or less-screening does not suggest unhealthy drinking-brief intervention not indicated Substance Abuse History in the last 12 months:  Yes.   Consequences of Substance Abuse: Negative Previous Psychotropic Medications: Yes  Psychological Evaluations: No  Past Medical History:  Past Medical History:  Diagnosis Date  . Schizo affective schizophrenia [facility])     Past Surgical History:  Procedure Laterality Date  . SKIN GRAFT Left unknown   Pt reports left foot and leg skin graft   Family History: History reviewed. No pertinent family history.  Tobacco Screening: Have you used any form of tobacco in the last 30 days? (Cigarettes, Smokeless Tobacco, Cigars, and/or Pipes): Yes Tobacco use, Select all that apply: 4 or less cigarettes per day Are you interested in Tobacco Cessation Medications?: No, patient refused Counseled patient on smoking cessation including recognizing danger situations, developing coping skills and basic information about quitting provided: Refused/Declined practical counseling Social History:  History  Alcohol Use No     History  Drug Use  . Types: Marijuana    Comment: "every now and again"    Additional Social History: Marital status: Single Are you sexually active?: No What is your sexual orientation?: Gay  Has your sexual activity been affected by drugs, alcohol, medication, or emotional stress?: N/A Does patient have children?: No    Pain Medications: see PTA meds Prescriptions: see PTA meds Over the Counter: see PTA meds History of alcohol / drug use?: No history of alcohol / drug abuse                    Allergies:  No Known Allergies Lab Results:  Results for orders placed or performed during the hospital encounter of 03/25/16 (from the past 48 hour(s))  Hemoglobin A1c     Status: None   Collection Time: 03/26/16  7:30 AM  Result Value Ref Range    Hgb A1c MFr Bld 6.0 4.0 - 6.0 %  Lipid panel     Status: Abnormal   Collection Time: 03/26/16  7:30 AM  Result Value Ref Range   Cholesterol 158 0 - 200 mg/dL   Triglycerides 161 <096 mg/dL   HDL 29 (L) >04 mg/dL   Total CHOL/HDL Ratio 5.4 RATIO   VLDL 27 0 - 40 mg/dL   LDL Cholesterol 540 (H) 0 - 99 mg/dL    Comment:        Total Cholesterol/HDL:CHD Risk Coronary Heart Disease Risk Table                     Men   Women  1/2 Average Risk   3.4   3.3  Average Risk       5.0   4.4  2 X Average Risk   9.6   7.1  3 X Average Risk  23.4   11.0  Use the calculated Patient Ratio above and the CHD Risk Table to determine the patient's CHD Risk.        ATP III CLASSIFICATION (LDL):  <100     mg/dL   Optimal  161-096  mg/dL   Near or Above                    Optimal  130-159  mg/dL   Borderline  045-409  mg/dL   High  >811     mg/dL   Very High   TSH     Status: None   Collection Time: 03/26/16  7:30 AM  Result Value Ref Range   TSH 1.818 0.350 - 4.500 uIU/mL    Blood Alcohol level:  Lab Results  Component Value Date   ETH <5 03/24/2016    Metabolic Disorder Labs:  Lab Results  Component Value Date   HGBA1C 6.0 03/26/2016   No results found for: PROLACTIN Lab Results  Component Value Date   CHOL 158 03/26/2016   TRIG 134 03/26/2016   HDL 29 (L) 03/26/2016   CHOLHDL 5.4 03/26/2016   VLDL 27 03/26/2016   LDLCALC 102 (H) 03/26/2016    Current Medications: Current Facility-Administered Medications  Medication Dose Route Frequency Provider Last Rate Last Dose  . acetaminophen (TYLENOL) tablet 650 mg  650 mg Oral Q6H PRN Audery Amel, MD      . alum & mag hydroxide-simeth (MAALOX/MYLANTA) 200-200-20 MG/5ML suspension 30 mL  30 mL Oral Q4H PRN Audery Amel, MD      . benztropine (COGENTIN) tablet 1 mg  1 mg Oral BID PRN Audery Amel, MD      . lithium carbonate capsule 600 mg  600 mg Oral TID AC Beckie Viscardi B Naje Rice, MD   600 mg at 03/26/16 1107  .  LORazepam (ATIVAN) tablet 2 mg  2 mg Oral Q4H PRN Audery Amel, MD      . magnesium hydroxide (MILK OF MAGNESIA) suspension 30 mL  30 mL Oral Daily PRN Audery Amel, MD      . nicotine (NICODERM CQ - dosed in mg/24 hours) patch 21 mg  21 mg Transdermal Daily Dontai Pember B Rogan Wigley, MD      . risperiDONE (RISPERDAL) tablet 1 mg  1 mg Oral Q4H PRN Audery Amel, MD      . risperiDONE (RISPERDAL) tablet 2 mg  2 mg Oral BID Huberta Tompkins B Annsley Akkerman, MD      . temazepam (RESTORIL) capsule 15 mg  15 mg Oral QHS Lamara Brecht B Nevea Spiewak, MD       PTA Medications: Prescriptions Prior to Admission  Medication Sig Dispense Refill Last Dose  . atomoxetine (STRATTERA) 40 MG capsule Take 40 mg by mouth daily.   unknown at unknown  . benztropine (COGENTIN) 1 MG tablet Take 1 mg by mouth at bedtime.   unknown at unknown  . docusate sodium (COLACE) 100 MG capsule Take 100 mg by mouth daily as needed for mild constipation.   prn at prn  . lithium carbonate 300 MG capsule Take 900 mg by mouth at bedtime.   unkown at unknown  . risperiDONE (RISPERDAL) 1 MG tablet Take 1 mg by mouth daily as needed.   prn at prn  . risperiDONE microspheres (RISPERDAL CONSTA) 50 MG injection Inject 50 mg into the muscle every 14 (fourteen) days.   03/21/2016 at unknown  . traZODone (DESYREL) 50 MG tablet Take 25-50 mg by mouth at bedtime as  needed for sleep.   prn at prn    Musculoskeletal: Strength & Muscle Tone: within normal limits Gait & Station: normal Patient leans: N/A  Psychiatric Specialty Exam: Physical Exam  Nursing note and vitals reviewed. Constitutional: He is oriented to person, place, and time. He appears well-developed and well-nourished.  HENT:  Head: Normocephalic and atraumatic.  Eyes: Conjunctivae and EOM are normal. Pupils are equal, round, and reactive to light.  Neck: Normal range of motion. Neck supple.  Cardiovascular: Normal rate, regular rhythm and normal heart sounds.   Respiratory: Effort normal  and breath sounds normal.  GI: Soft. Bowel sounds are normal.  Musculoskeletal: Normal range of motion.  Neurological: He is alert and oriented to person, place, and time.  Skin: Skin is warm and dry.    Review of Systems  Psychiatric/Behavioral: Positive for hallucinations.  All other systems reviewed and are negative.   Blood pressure (S) (!) 174/105, pulse (!) 103, temperature 98.4 F (36.9 C), temperature source Oral, resp. rate 18, height 5\' 10"  (1.778 m), weight 92.1 kg (203 lb), SpO2 100 %.Body mass index is 29.13 kg/m.  See SRA.                                                  Sleep:  Number of Hours: 4.25       Treatment Plan Summary: Daily contact with patient to assess and evaluate symptoms and progress in treatment and Medication management   Ernest Cook is a 25 year old male with history of schizophrenia admitted floridly psychotic in the context of medication noncompliance.  1. Agitated behavior. The patient presented with agitation but it has resolved. Ativan is available.  2. Mood and psychosis. The patient has been treated with a combination of lithium and Risperdal. Lithium level was low on admission. He reports he received Risperdal Consta injection 3 days prior to admission. We restarted lithium and oral Risperdal.  3. Metabolic syndrome monitoring. Lipid panel, TSH, hemoglobin A1c and prolactin are pending.  4. Smoking. Nicotine patch is available.  5. Insomnia. He did not sleep well with trazodone will start Restoril tonight.  6. Disposition. The patient will be discharged to home. [facility]   Observation Level/Precautions:  15 minute checks  Laboratory:  CBC Chemistry Profile UDS UA  Psychotherapy:    Medications:    Consultations:    Discharge Concerns:    Estimated LOS:  Other:     I certify that inpatient services furnished can reasonably be expected to improve the patient's  condition.    Kristine LineaJolanta Cecilio Ohlrich, MD 8/9/20174:50 PM

## 2016-03-26 NOTE — Progress Notes (Signed)
Pt blood pressure elevated this morning. Blood pressure taken manually with a result of 172/98. MD on call notified.

## 2016-03-26 NOTE — Progress Notes (Addendum)
Patient with blunted affect, cooperative behavior with meals and meds. No SI/HI/AVH at this time. Patient with poor insight and reports his mood is bette and that he is ready to discharge today. Meets with Child psychotherapistsocial worker. Therapy groups encouraged, safety maintained. Social with select peer. Patient with no aggression. Attends group and cooperative with meds.

## 2016-03-26 NOTE — Tx Team (Signed)
Interdisciplinary Treatment Plan Update (Adult)         Date: 03/26/2016   Time Reviewed: 10:30 AM   Progress in Treatment: Improving Attending groups: Yes  Participating in groups: Yes  Taking medication as prescribed: Yes  Tolerating medication: Yes  Family/Significant other contact made: CSW awaiting return call from collateral  Patient understands diagnosis: Yes  Discussing patient identified problems/goals with staff: Yes  Medical problems stabilized or resolved: Yes  Denies suicidal/homicidal ideation: Yes  Issues/concerns per patient self-inventory: Yes  Other:   New problem(s) identified: N/A   Discharge Plan or Barriers: see below   Reason for Continuation of Hospitalization:   Depression   Anxiety   Medication Stabilization   Comments: N/A   Estimated length of stay: 7 days    Patient is a 25 year old male admitted for bizarre behavior. Patient lives in [place], [place].   Patient was seen yesterday in the emergency room after being brought into the hospital psychotic. Decision was made to admit him to the psychiatric hospital. Concern was expressed by treatment team about possible agitation. On reevaluation today the patient denies any wish to harm anyone. Today he has not been physically aggressive although at times he has been walking out of his room. Has responded okay to verbal redirection. On at least one occasion earlier today I heard him make a whooping sound but reviewed today he is calm. Thoughts are still disorganized. Affect euthymic. Has been taking medication. Patient will benefit from crisis stabilization, medication evaluation, group therapy, and psycho education in addition to case management for discharge planning. Patient and CSW reviewed pt's identified goals and treatment plan. Pt verbalized understanding and agreed to treatment plan.    Review of initial/current patient goals per problem list:  1. Goal(s): Patient will participate in aftercare  plan   Met: Yes  Target date: 3-5 days post admission date   As evidenced by: Patient will participate within aftercare plan AEB aftercare provider and housing plan at discharge being identified.   Patient will follow-up with [facility] [place], [place].  2. Goal (s): Patient will exhibit decreased depressive symptoms and suicidal ideations.   Met: Goal progressing   Target date: 3-5 days post admission date   As evidenced by: Patient will utilize self-rating of depression at 3 or below and demonstrate decreased signs of depression or be deemed stable for discharge by MD.   Pt reports that he is "love struck" and displays depressive symptoms. Rates depression score of 6 at this time.  3. Goal(s): Patient will demonstrate decreased signs and symptoms of anxiety.   Met: No  Target date: 3-5 days post admission date   As evidenced by: Patient will utilize self-rating of anxiety at 3 or below and demonstrated decreased signs of anxiety, or be deemed stable for discharge by MD   Pt reports an anxiety score of 5 at this time.     4. Goal(s): Patient will demonstrate decreased signs of psychosis  * Met: No * Target date: 3-5 days post admission date  * As evidenced by: Patient will demonstrate decreased frequency of AVH or return to baseline function   Pt is still experiencing psychosis and delusions at this time.  5. Goal (s): Patient will demonstrate decreased signs of mania  * Met: No * Target date: 3-5 days post admission date  * As evidenced by: Patient demonstrate decreased signs of mania AEB decreased mood instability and demonstration of stable mood   Pt mood is  still unstable at this time. Still presents with manic symptoms.  Attendees:  Patient: Ernest Cook Family:  Physician: Orson Slick, MD   03/26/2016 10:30 AM  Nursing: , RN       03/26/2016 10:30 AM  Clinical Social Worker: Emilie Rutter, [place]  03/26/2016 10:30 AM  Recreational Therapist: Everitt Amber                        03/26/2016  10:30AM

## 2016-03-27 LAB — PROLACTIN: Prolactin: 48 ng/mL — ABNORMAL HIGH (ref 4.0–15.2)

## 2016-03-27 MED ORDER — LITHIUM CARBONATE 300 MG PO CAPS
1800.0000 mg | ORAL_CAPSULE | Freq: Every day | ORAL | Status: DC
  Administered 2016-03-28 – 2016-03-30 (×3): 1800 mg via ORAL
  Filled 2016-03-27 (×3): qty 6

## 2016-03-27 NOTE — [facility] Group Notes (Signed)
[facility] LCSW Group Therapy  03/27/2016 3:38 PM  Type of Therapy:  Group Therapy  Participation Level:  None  Participation Quality:  Drowsy  Affect:  Flat and Lethargic  Cognitive:  Lacking  Insight:  None  Engagement in Therapy:  None  Modes of Intervention:  Discussion, Education and Support  Summary of Progress/Problems: Balance in life: Patients will discuss the concept of balance and how it looks and feels to be unbalanced. Pt will identify areas in their life that is unbalanced and ways to become more balanced. Pt initially came to group on this date but left group after 5 minutes stating "I'm too tired can I leave?" Pt left group and did not return.   Krithika Tome G. Garnette CzechSampson MSW, LCSWA 03/27/2016, 3:47 PM

## 2016-03-27 NOTE — Progress Notes (Signed)
D: Patient is drowsy  on the unit this shift. Patient not attended and actively participated in groups today. Patient denies suicidal ideation, homicidal ideation, auditory or visual hallucinations at the present time.  A: Scheduled medications are administered to patient as per MD orders. Emotional support and encouragement are provided. Patient is maintained on q.15 minute safety checks. Patient is informed to notify staff with questions or concerns. R: No adverse medication reactions are noted. Patient is cooperative with medication administration  . Patient is non receptive due to sedation, calm and cooperative on the unit at this time. Patient does not interact   with others on the unit this shift,pt isolative. Patient contracts for safety at this time. Patient remains safe at this time.

## 2016-03-27 NOTE — Progress Notes (Signed)
[facility]  03/27/2016 6:18 PM Ernest Cook  MRN:  [id number]  Subjective:  [name] remains floridly psychotic and delusional but it is more interactive today. He no longer talks about love all the time. He refuses lithium today but promised to take it in the evening.  Principal Problem: Schizoaffective disorder, bipolar type (HCC) Diagnosis:   Patient Active Problem List   Diagnosis Date Noted  . Schizoaffective disorder, bipolar type (HCC) [F25.0] 03/25/2016    Priority: High  . Cannabis use disorder, moderate, dependence (HCC) [F12.20] 03/26/2016  . Tobacco use disorder [F17.200] 03/26/2016  . Involuntary commitment [Z04.6] 03/24/2016   Total Time spent with patient: 20 minutes  Past Psychiatric History: Schizoaffective disorder.  Past Medical History:  Past Medical History:  Diagnosis Date  . Schizo affective schizophrenia [facility](HCC)     Past Surgical History:  Procedure Laterality Date  . SKIN GRAFT Left unknown   Pt reports left foot and leg skin graft   Family History: History reviewed. No pertinent family history. Family Psychiatric  History: See H&P. Social History:  History  Alcohol Use No     History  Drug Use  . Types: Marijuana    Comment: "every now and again"    Social History   Social History  . Marital status: Single    Spouse name: N/A  . Number of children: N/A  . Years of education: N/A   Social History Main Topics  . Smoking status: Current Every Day Smoker    Packs/day: 0.25    Types: Cigarettes  . Smokeless tobacco: Never Used  . Alcohol use No  . Drug use:     Types: Marijuana     Comment: "every now and again"  . Sexual activity: Yes    Birth control/ protection: Condom     Comment: Pt reports he is attracted to men   Other Topics Concern  . None   Social History Narrative  . None   Additional Social History:    Pain Medications: see PTA meds Prescriptions: see PTA meds Over the Counter: see PTA meds History  of alcohol / drug use?: No history of alcohol / drug abuse                    Sleep: Fair  Appetite:  Fair  Current Medications: Current Facility-Administered Medications  Medication Dose Route Frequency Provider Last Rate Last Dose  . acetaminophen (TYLENOL) tablet 650 mg  650 mg Oral Q6H PRN Audery AmelJohn T Clapacs, MD      . alum & mag hydroxide-simeth (MAALOX/MYLANTA) 200-200-20 MG/5ML suspension 30 mL  30 mL Oral Q4H PRN Audery AmelJohn T Clapacs, MD      . benztropine (COGENTIN) tablet 1 mg  1 mg Oral BID PRN Audery AmelJohn T Clapacs, MD      . lithium carbonate capsule 600 mg  600 mg Oral TID AC Jolanta B Pucilowska, MD   600 mg at 03/27/16 1715  . LORazepam (ATIVAN) tablet 2 mg  2 mg Oral Q4H PRN Audery AmelJohn T Clapacs, MD   2 mg at 03/26/16 2223  . magnesium hydroxide (MILK OF MAGNESIA) suspension 30 mL  30 mL Oral Daily PRN Audery AmelJohn T Clapacs, MD      . nicotine (NICODERM CQ - dosed in mg/24 hours) patch 21 mg  21 mg Transdermal Daily Jolanta B Pucilowska, MD      . risperiDONE (RISPERDAL) tablet 1 mg  1 mg Oral Q4H PRN Audery AmelJohn T Clapacs, MD      .  risperiDONE (RISPERDAL) tablet 2 mg  2 mg Oral BID Shari Prows, MD   2 mg at 03/26/16 2225  . temazepam (RESTORIL) capsule 15 mg  15 mg Oral QHS Shari Prows, MD        Lab Results:  Results for orders placed or performed during the hospital encounter of 03/25/16 (from the past 48 hour(s))  Hemoglobin A1c     Status: None   Collection Time: 03/26/16  7:30 AM  Result Value Ref Range   Hgb A1c MFr Bld 6.0 4.0 - 6.0 %  Lipid panel     Status: Abnormal   Collection Time: 03/26/16  7:30 AM  Result Value Ref Range   Cholesterol 158 0 - 200 mg/dL   Triglycerides 161 <096 mg/dL   HDL 29 (L) >04 mg/dL   Total CHOL/HDL Ratio 5.4 RATIO   VLDL 27 0 - 40 mg/dL   LDL Cholesterol 540 (H) 0 - 99 mg/dL    Comment:        Total Cholesterol/HDL:CHD Risk Coronary Heart Disease Risk Table                     Men   Women  1/2 Average Risk   3.4   3.3  Average  Risk       5.0   4.4  2 X Average Risk   9.6   7.1  3 X Average Risk  23.4   11.0        Use the calculated Patient Ratio above and the CHD Risk Table to determine the patient's CHD Risk.        ATP III CLASSIFICATION (LDL):  <100     mg/dL   Optimal  981-191  mg/dL   Near or Above                    Optimal  130-159  mg/dL   Borderline  478-295  mg/dL   High  >621     mg/dL   Very High   Prolactin     Status: Abnormal   Collection Time: 03/26/16  7:30 AM  Result Value Ref Range   Prolactin 48.0 (H) 4.0 - 15.2 ng/mL    Comment: (NOTE) Performed At: [facility] [address] [place], [place] [id number] Mila Homer MD [id number]   TSH     Status: None   Collection Time: 03/26/16  7:30 AM  Result Value Ref Range   TSH 1.818 0.350 - 4.500 uIU/mL    Blood Alcohol level:  Lab Results  Component Value Date   ETH <5 03/24/2016    Metabolic Disorder Labs: Lab Results  Component Value Date   HGBA1C 6.0 03/26/2016   Lab Results  Component Value Date   PROLACTIN 48.0 (H) 03/26/2016   Lab Results  Component Value Date   CHOL 158 03/26/2016   TRIG 134 03/26/2016   HDL 29 (L) 03/26/2016   CHOLHDL 5.4 03/26/2016   VLDL 27 03/26/2016   LDLCALC 102 (H) 03/26/2016    Physical Findings: AIMS: Facial and Oral Movements Muscles of Facial Expression: None, normal Lips and Perioral Area: None, normal Jaw: None, normal Tongue: None, normal,Extremity Movements Upper (arms, wrists, hands, fingers): None, normal Lower (legs, knees, ankles, toes): None, normal, Trunk Movements Neck, shoulders, hips: None, normal, Overall Severity Severity of abnormal movements (highest score from questions above): None, normal Incapacitation due to abnormal movements: None, normal Patient's awareness of abnormal movements (rate only  patient's report): No Awareness, Dental Status Current problems with teeth and/or dentures?: No Does patient usually wear dentures?: No  CIWA:     COWS:     Musculoskeletal: Strength & Muscle Tone: within normal limits Gait & Station: normal Patient leans: N/A  Psychiatric Specialty Exam: Physical Exam  Nursing note and vitals reviewed.   Review of Systems  Psychiatric/Behavioral: Positive for hallucinations.  All other systems reviewed and are negative.   Blood pressure 135/84, pulse 97, temperature 98.3 F (36.8 C), temperature source Oral, resp. rate 18, height  (1.778 m), weight 92.1 kg (203 lb), SpO2 100 %.Body mass index is 29.13 kg/m.  General Appearance: Casual  Eye Contact:  Good  Speech:  Blocked  Volume:  Normal  Mood:  Euphoric  Affect:  Congruent  Thought Process:  Disorganized  Orientation:  Full (Time, Place, and Person)  Thought Content:  Delusions and Paranoid Ideation  Suicidal Thoughts:  No  Homicidal Thoughts:  No  Memory:  Immediate;   Fair Recent;   Fair Remote;   Fair  Judgement:  Poor  Insight:  Lacking  Psychomotor Activity:  Normal  Concentration:  Concentration: Fair and Attention Span: Fair  Recall:  [employer] of Knowledge:  Fair  Language:  Poor  Akathisia:  No  Handed:  Right  AIMS (if indicated):     Assets:  Communication Skills Desire for Improvement Financial Resources/Insurance Housing Physical Health Resilience Social Support  ADL's:  Intact  Cognition:  WNL  Sleep:  Number of Hours: 7.15     Treatment Plan Summary: Daily contact with patient to assess and evaluate symptoms and progress in treatment and Medication management   Ernest Cook is a 25 year old male with history of schizophrenia admitted floridly psychotic in the context of medication noncompliance.  1. Agitated behavior. The patient presented with agitation but it has resolved. Ativan is available.  2. Mood and psychosis. The patient has been treated with a combination of lithium and Risperdal. Lithium level was low on admission. He reports he received Risperdal Consta injection 3 days prior  to admission. We restarted lithium and oral Risperdal. I will order all the Lithium at night today.  3. Metabolic syndrome monitoring. Lipid panel, TSH, hemoglobin A1c and prolactin are pending.  4. Smoking. Nicotine patch is available.  5. Insomnia. He did not sleep well with trazodone will start Restoril tonight.  6. Disposition. The patient will be discharged to home. He will follow-up with [facility] act team.  Kristine Linea, MD 03/27/2016, 6:18 PM

## 2016-03-27 NOTE — [facility] Group Notes (Signed)
Goals Group  Date/Time:  Type of Therapy and Topic: Group Therapy: Goals Group: SMART Goals  ?  Participation Level: Moderate  ?  Description of Group:  ?  The purpose of a daily goals group is to assist and guide patients in setting recovery/wellness-related goals. The objective is to set goals as they relate to the crisis in which they were admitted. Patients will be using SMART goal modalities to set measurable goals. Characteristics of realistic goals will be discussed and patients will be assisted in setting and processing how one will reach their goal. Facilitator will also assist patients in applying interventions and coping skills learned in psycho-education groups to the SMART goal and process how one will achieve defined goal.  ?  Therapeutic Goals:  ?  -Patients will develop and document one goal related to or their crisis in which brought them into treatment.  -Patients will be guided by LCSW using SMART goal setting modality in how to set a measurable, attainable, realistic and time sensitive goal.  -Patients will process barriers in reaching goal.  -Patients will process interventions in how to overcome and successful in reaching goal.  ?  Patient's Goal: Pt's goal today is to "have a good time while he is here." Pt stated that he does not want to be at the hospital but wants to be better, so will invest in his care while he is here. ?  Therapeutic Modalities:  Motivational Interviewing  Cognitive Behavioral Therapy  Crisis Intervention Model  SMART goals setting   Lynden OxfordKadijah R. Javonte Elenes, MSW, LCSW-A 10:43AM  10:43AM

## 2016-03-27 NOTE — Progress Notes (Signed)
Patient's mood was irritable this morning.States "you are giving me over dose."Refused to take his medicines.Stayed in bed and hesitant to talk to staff.Attended groups partially.Stated that he will take lithium at night.Made Dr.P aware of that.Denies suicidal or homicidal ideations & AV hallucinations.

## 2016-03-27 NOTE — Plan of Care (Signed)
Problem: Coping: Goal: Ability to verbalize feelings will improve Outcome: Not Progressing Patient not tooo verbal very drowsy on this shift Tax adviserCTownsend RN

## 2016-03-27 NOTE — [facility] Group Notes (Signed)
[facility] Group Notes:  (Nursing/MHT/Case Management/Adjunct)  Date:  03/27/2016  Time:  3:55 AM  Type of Therapy:  Group Therapy  Participation Level:  Active  Participation Quality:  Appropriate  Affect:  Appropriate  Cognitive:  Appropriate  Insight:  Appropriate  Engagement in Group:  Engaged  Modes of Intervention:  n/a  Summary of Progress/Problems:  Ernest Cook 03/27/2016, 3:55 AM

## 2016-03-27 NOTE — Progress Notes (Signed)
Recreation Therapy Notes  Date: 08.10.17 Time: 9:30 am Location: Craft Room  Group Topic: Leisure Education  Goal Area(s) Addresses:  Patient will identify things they are grateful for. Patient will verbalize why it is important to be grateful.  Behavioral Response: Inattentive, Left early  Intervention: Grateful Wheel  Activity: Patients were given an I Am Grateful For worksheet and instructed to write things they were grateful for under each category.  Education: LRT educated patients on how being grateful can be a positive change in their lives.  Education Outcome: Patient left before LRT educated group.  Clinical Observations/Feedback: Patient laid his head on the table for the majority of group. Patient left group at approximately 10:05 am. Patient did not return to group.  Jacquelynn CreeGreene,Olivia Pavelko M, LRT/CTRS 03/27/2016 10:32 AM

## 2016-03-28 MED ORDER — LORAZEPAM 2 MG/ML IJ SOLN: 2.0000 mg | INTRAMUSCULAR | Status: DC | PRN

## 2016-03-28 MED ORDER — DIPHENHYDRAMINE HCL 25 MG PO CAPS
50.0000 mg | ORAL_CAPSULE | Freq: Four times a day (QID) | ORAL | Status: DC | PRN
  Administered 2016-03-28: 50 mg via ORAL
  Filled 2016-03-28: qty 2

## 2016-03-28 MED ORDER — HALOPERIDOL LACTATE 5 MG/ML IJ SOLN
10.0000 mg | Freq: Four times a day (QID) | INTRAMUSCULAR | Status: DC | PRN
  Filled 2016-03-28: qty 2

## 2016-03-28 MED ORDER — LORAZEPAM 2 MG/ML IJ SOLN
INTRAMUSCULAR | Status: AC
  Filled 2016-03-28: qty 1

## 2016-03-28 MED ORDER — LORAZEPAM 2 MG PO TABS
2.0000 mg | ORAL_TABLET | ORAL | Status: DC | PRN
  Administered 2016-03-28 – 2016-03-29 (×2): 2 mg via ORAL
  Filled 2016-03-28 (×2): qty 1

## 2016-03-28 MED ORDER — DIPHENHYDRAMINE HCL 50 MG/ML IJ SOLN
50.0000 mg | Freq: Four times a day (QID) | INTRAMUSCULAR | Status: DC | PRN
  Filled 2016-03-28: qty 1

## 2016-03-28 MED ORDER — HALOPERIDOL 5 MG PO TABS
10.0000 mg | ORAL_TABLET | Freq: Four times a day (QID) | ORAL | Status: DC | PRN
Start: 2016-03-28 — End: 2016-04-01
  Administered 2016-03-28 – 2016-03-29 (×2): 10 mg via ORAL
  Filled 2016-03-28 (×2): qty 2

## 2016-03-28 NOTE — [facility] Group Notes (Signed)
[facility] Group Notes:  (Nursing/MHT/Case Management/Adjunct)  Date:  03/28/2016  Time:  3:53 PM  Type of Therapy:  Psychoeducational Skills  Participation Level:  Did Not Attend  Lynelle SmokeCara Travis [facility] HospitalMadoni 03/28/2016, 3:53 PM

## 2016-03-28 NOTE — Plan of Care (Signed)
Problem: Coping: Goal: Ability to verbalize feelings will improve Outcome: Not Progressing Patient acted out & put on 1:1 security.

## 2016-03-28 NOTE — [facility] Group Notes (Signed)
[facility] Group Notes:  (Nursing/MHT/Case Management/Adjunct)  Date:  03/28/2016  Time:  3:54 AM  Type of Therapy:  Psychoeducational Skills  Participation Level:  Active  Participation Quality:  Appropriate  Affect:  Appropriate  Cognitive:  Appropriate  Insight:  Appropriate and Good  Engagement in Group:  Engaged  Modes of Intervention:  Discussion, Socialization and Support  Summary of Progress/Problems:  Chancy MilroyLaquanda Y Saeed Toren 03/28/2016, 3:54 AM

## 2016-03-28 NOTE — Plan of Care (Signed)
Problem: Safety: Goal: Ability to remain free from injury will improve Outcome: Progressing Patient has remained free from anger since admission.

## 2016-03-28 NOTE — [facility] Suicide Risk Assessment (Signed)
[facility] INPATIENT:  Family/Significant Other Suicide Prevention Education  Suicide Prevention Education:  Education Completed; grandmother,  Frazier Richardslene Russell @ [phone] has been identified by the patient as the family member/significant other with whom the patient will be residing, and identified as the person(s) who will aid the patient in the event of a mental health crisis (suicidal ideations/suicide attempt).  With written consent from the patient, the family member/significant other has been provided the following suicide prevention education, prior to the and/or following the discharge of the patient. Pt's grandmother spoke with pt who is wanting to discharge but symptoms are not better at this time. Pt is also not taking medications, grandmother is aware.   The suicide prevention education provided includes the following:  Suicide risk factors  Suicide prevention and interventions  National Suicide Hotline telephone number  [facility] assessment telephone number  [facility] Emergency Assistance 911  [facility] and/or Residential Mobile Crisis Unit telephone number  Request made of family/significant other to:  Remove weapons (e.g., guns, rifles, knives), all items previously/currently identified as safety concern.    Remove drugs/medications (over-the-counter, prescriptions, illicit drugs), all items previously/currently identified as a safety concern.  The family member/significant other verbalizes understanding of the suicide prevention education information provided.  The family member/significant other agrees to remove the items of safety concern listed above.  Lynden OxfordKadijah R Bell Cai, MSW, LCSW-A 03/28/2016, 1:19 PM

## 2016-03-28 NOTE — Progress Notes (Signed)
Recreation Therapy Notes  Date: 08.11.17 Time: 1:00 pm Location: Craft Room  Group Topic: Coping Skills  Goal Area(s) Addresses:  Patient will participate in healthy coping skill. Patient will verbalize benefit of using art as a coping skill.  Behavioral Response: Attentive, Left early  Intervention: Coloring  Activity: Patients were given coloring sheets to color and were instructed to think about the emotions they were feeling and what their minds were focused on.  Education: LRT educated patients on healthy coping skills.  Education Outcome: Patient left before LRT educated group.  Clinical Observations/Feedback: Patient colored coloring sheet. Patient left group at approximately 1:45 pm to get water. Patient did not return to group.  Jacquelynn CreeGreene,Aahil Fredin M, LRT/CTRS 03/28/2016 2:29 PM

## 2016-03-28 NOTE — Progress Notes (Signed)
Patient is put 1:1 security for safety.

## 2016-03-28 NOTE — Progress Notes (Signed)
Patient was pleasant this morning.Came to staff and apologized for his action yesterday.Compliant with medications.Attended groups.This afternoon patient got agitated & started acting out.Patient started yelling in the hall way states " I need to go home."Patient was hitting and pushing the doors hardly.He broke the door in the hall way.Forced med ordered and code 300 called.Tried to deescalate the patient.Patient states "I hate needles I will take the pills."Patient took PO medicine & calm down for an hour and 30 mts.After dinner he acted out again.Throwing the trash can bagging the door and trying to break the glasses.Tried to deescalate the patient.Patient calm down & resting in bed.

## 2016-03-28 NOTE — Plan of Care (Signed)
Problem: Coping: Goal: Ability to demonstrate self-control will improve Outcome: Progressing Patient is cooperative and apologized for his action yesterday.

## 2016-03-28 NOTE — [facility] Group Notes (Signed)
[facility] LCSW Group Therapy  03/28/2016 2:55 PM  Type of Therapy:  Group Therapy  Participation Level:  Active  Participation Quality:  Appropriate  Affect:  Appropriate  Cognitive:  Alert  Insight:  Developing/Improving  Engagement in Therapy:  Engaged  Modes of Intervention:  Discussion, Education and Support  Summary of Progress/Problems: Feelings around Relapse. Group members discussed the meaning of relapse and shared personal stories of relapse, how it affected them and others, and how they perceived themselves during this time. Group members were encouraged to identify triggers, warning signs and coping skills used when facing the possibility of relapse. Social supports were discussed and explored in detail. Pt stated he was feeling better and has opened up with his family about his needs. CSW provided support to pt and encouraged his continued progress.   Ernest Cook G. Garnette CzechSampson MSW, LCSWA 03/28/2016, 2:57 PM

## 2016-03-28 NOTE — Progress Notes (Signed)
D: Patient exhibits very bizarre behaviors. He denies SI/HI/AVH but it appears that he is responding to internal stimuli. Patient attended wrap up group.  A: Medication given with education. Encouragement provided.  R: Patient was compliant with medication. He has remained calm and cooperative. Safety maintained with 15 min checks.

## 2016-03-28 NOTE — Progress Notes (Signed)
[facility] Progress Note for Medication Administration to Non-consenting Patients (For Involuntarily Committed Patients)  Patient: Ernest Cook Date of Birth: [id number]/1992 MRN: [id number]  Reason for the Medication: The patient, without the benefit of the specific treatment measure, is incapable of participating in any available treatment plan that will give the patient a realistic opportunity of improving the patient's condition. There is, without the benefit of the specific treatment measure, a significant possibility that the patient will harm self or others before improvement of the patient's condition is realized.  Consideration of Side Effects: Consideration of the side effects related to the medication plan has been given.  Rationale for Medication Administration: This 25 year old man who has psychotic symptoms and agitation became aggressive today with disorganized thinking. He was not responsive to verbal redirection and was initially refusing oral medication. He was showing behavior that was high risk for injury to self or others and was not calming down with appropriate verbal treatment. Emergency administration of medicine was necessary for safety. Furthermore his psychotic symptoms are related to an underlying psychotic disorder that is not likely to improve without medication administration. I approve and recommend the force medicine order.    Ernest RasmussenJohn Talana Slatten, MD 03/28/16  6:54 PM   This documentation is good for (7) seven days from the date of the MD signature. New documentation must be completed every seven (7) days with detailed justification in the medical record if the patient requires continued non-emergent administration of psychotropic medications.

## 2016-03-28 NOTE — Progress Notes (Addendum)
[facility] MD Progress Note  03/28/2016 1:04 PM Ernest Cook  MRN:  [id number]  Subjective:  Ernest Cook is a 25 year old male with history of schizoaffective disorder admitted for psychotic break in the context of medication noncompliance. He has not been refusing Lithium but took Risperdal. He initially promised to take Lithium tonight before agitation.    Today, he remains floridly psychotic and delusional. He was calm in the morning but apparently called his family to pick him up. When he realized he is not going home he became loud and agitated. Security was called. Haldol, Ativan and Benadryl were ordered. We may need forced medications order. I will contact Dr. Toni Amend for second opinion.   Principal Problem: Schizoaffective disorder, bipolar type (HCC) Diagnosis:   Patient Active Problem List   Diagnosis Date Noted  . Schizoaffective disorder, bipolar type (HCC) [F25.0] 03/25/2016    Priority: High  . Cannabis use disorder, moderate, dependence (HCC) [F12.20] 03/26/2016  . Tobacco use disorder [F17.200] 03/26/2016  . Involuntary commitment [Z04.6] 03/24/2016   Total Time spent with patient: 20 minutes  Past Psychiatric History: Schizoaffective disorder.  Past Medical History:  Past Medical History:  Diagnosis Date  . Schizo affective schizophrenia [facility])     Past Surgical History:  Procedure Laterality Date  . SKIN GRAFT Left unknown   Pt reports left foot and leg skin graft   Family History: History reviewed. No pertinent family history. Family Psychiatric  History: See H&P. Social History:  History  Alcohol Use No     History  Drug Use  . Types: Marijuana    Comment: "every now and again"    Social History   Social History  . Marital status: Single    Spouse name: N/A  . Number of children: N/A  . Years of education: N/A   Social History Main Topics  . Smoking status: Current Every Day Smoker    Packs/day: 0.25    Types: Cigarettes  . Smokeless tobacco: Never  Used  . Alcohol use No  . Drug use:     Types: Marijuana     Comment: "every now and again"  . Sexual activity: Yes    Birth control/ protection: Condom     Comment: Pt reports he is attracted to men   Other Topics Concern  . None   Social History Narrative  . None   Additional Social History:    Pain Medications: see PTA meds Prescriptions: see PTA meds Over the Counter: see PTA meds History of alcohol / drug use?: No history of alcohol / drug abuse                    Sleep: Fair  Appetite:  Fair  Current Medications: Current Facility-Administered Medications  Medication Dose Route Frequency Provider Last Rate Last Dose  . acetaminophen (TYLENOL) tablet 650 mg  650 mg Oral Q6H PRN Audery Amel, MD      . alum & mag hydroxide-simeth (MAALOX/MYLANTA) 200-200-20 MG/5ML suspension 30 mL  30 mL Oral Q4H PRN Audery Amel, MD      . benztropine (COGENTIN) tablet 1 mg  1 mg Oral BID PRN Audery Amel, MD      . lithium carbonate capsule 1,800 mg  1,800 mg Oral QHS Lizvet Chunn B Neftaly Swiss, MD      . LORazepam (ATIVAN) tablet 2 mg  2 mg Oral Q4H PRN Audery Amel, MD   2 mg at 03/26/16 2223  . magnesium hydroxide (MILK  OF MAGNESIA) suspension 30 mL  30 mL Oral Daily PRN Audery Amel, MD      . nicotine (NICODERM CQ - dosed in mg/24 hours) patch 21 mg  21 mg Transdermal Daily Eris Breck B Lyndee Herbst, MD      . risperiDONE (RISPERDAL) tablet 1 mg  1 mg Oral Q4H PRN Audery Amel, MD      . risperiDONE (RISPERDAL) tablet 2 mg  2 mg Oral BID Shari Prows, MD   2 mg at 03/28/16 0934  . temazepam (RESTORIL) capsule 15 mg  15 mg Oral QHS Ryllie Nieland B Yanky Vanderburg, MD   15 mg at 03/27/16 2213    Lab Results:  No results found for this or any previous visit (from the past 48 hour(s)).  Blood Alcohol level:  Lab Results  Component Value Date   ETH <5 03/24/2016    Metabolic Disorder Labs: Lab Results  Component Value Date   HGBA1C 6.0 03/26/2016   Lab Results   Component Value Date   PROLACTIN 48.0 (H) 03/26/2016   Lab Results  Component Value Date   CHOL 158 03/26/2016   TRIG 134 03/26/2016   HDL 29 (L) 03/26/2016   CHOLHDL 5.4 03/26/2016   VLDL 27 03/26/2016   LDLCALC 102 (H) 03/26/2016    Physical Findings: AIMS: Facial and Oral Movements Muscles of Facial Expression: None, normal Lips and Perioral Area: None, normal Jaw: None, normal Tongue: None, normal,Extremity Movements Upper (arms, wrists, hands, fingers): None, normal Lower (legs, knees, ankles, toes): None, normal, Trunk Movements Neck, shoulders, hips: None, normal, Overall Severity Severity of abnormal movements (highest score from questions above): None, normal Incapacitation due to abnormal movements: None, normal Patient's awareness of abnormal movements (rate only patient's report): No Awareness, Dental Status Current problems with teeth and/or dentures?: No Does patient usually wear dentures?: No  CIWA:    COWS:     Musculoskeletal: Strength & Muscle Tone: within normal limits Gait & Station: normal Patient leans: N/A  Psychiatric Specialty Exam: Physical Exam  Nursing note and vitals reviewed.   Review of Systems  Psychiatric/Behavioral: Positive for hallucinations.  All other systems reviewed and are negative.   Blood pressure 122/78, pulse 84, temperature 98.1 F (36.7 C), temperature source Oral, resp. rate 18, height  (1.778 m), weight 92.1 kg (203 lb), SpO2 100 %.Body mass index is 29.13 kg/m.  General Appearance: Casual  Eye Contact:  Good  Speech:  Blocked  Volume:  Normal  Mood:  Euphoric  Affect:  Congruent  Thought Process:  Disorganized  Orientation:  Full (Time, Place, and Person)  Thought Content:  Delusions and Paranoid Ideation  Suicidal Thoughts:  No  Homicidal Thoughts:  No  Memory:  Immediate;   Fair Recent;   Fair Remote;   Fair  Judgement:  Poor  Insight:  Lacking  Psychomotor Activity:  Normal  Concentration:   Concentration: Fair and Attention Span: Fair  Recall:  [employer] of Knowledge:  Fair  Language:  Poor  Akathisia:  No  Handed:  Right  AIMS (if indicated):     Assets:  Communication Skills Desire for Improvement Financial Resources/Insurance Housing Physical Health Resilience Social Support  ADL's:  Intact  Cognition:  WNL  Sleep:  Number of Hours: 6.25     Treatment Plan Summary: Daily contact with patient to assess and evaluate symptoms and progress in treatment and Medication management   Mr. Ingham is a 25 year old male with history of schizophrenia admitted floridly psychotic in  the context of medication noncompliance.  1. Agitated behavior. The patient presented with agitation but it has resolved. Ativan is available.  2. Mood and psychosis. The patient has been treated with a combination of lithium and Risperdal. Lithium level was low on admission. He reports he received Risperdal Consta injection 3 days prior to admission. We restarted lithium and oral Risperdal. I will order all the Lithium at night today.  3. Metabolic syndrome monitoring. Lipid panel, TSH, hemoglobin A1c and prolactin are pending.  4. Smoking. Nicotine patch is available.  5. Insomnia. He did not sleep well with trazodone will start Restoril tonight.  6. Disposition. The patient will be discharged to home. [facility].  Kristine LineaJolanta Sheriann Newmann, MD 03/28/2016, 1:04 PM

## 2016-03-29 NOTE — Progress Notes (Signed)
Pt pacing hallways. Security sitter present. Safety maintained.

## 2016-03-29 NOTE — Progress Notes (Addendum)
Pt resting in bed with eyes closed. Security sitter present. Safety maintained. 

## 2016-03-29 NOTE — Plan of Care (Signed)
Problem: Safety: Goal: Periods of time without injury will increase Outcome: Progressing Patient easily becomes agitated and will slam doors and yell, Patient can be redirected more sufficiently if a male speaks with him vs a woman that was noted. Patient does take medication.

## 2016-03-29 NOTE — Progress Notes (Signed)
Patient is now sitting in dayroom interacting with other Patients. Cooperative at this time.

## 2016-03-29 NOTE — Progress Notes (Signed)
Pt resting in bed with eyes closed. Security sitter present. Safety maintained. 

## 2016-03-29 NOTE — Progress Notes (Signed)
  Pt resting in bed with eyes closed. Security sitter present. Safety maintained. 

## 2016-03-29 NOTE — Progress Notes (Signed)
Pt did not participate in any assessment and did not attend evening wrap up group. Affect is irritable and isolative. Pt compliant with evening medications. Denied pain. No interaction with peers and minimal interaction with staff. Security sitter present. Safety maintained. Will continue to monitor.

## 2016-03-29 NOTE — Progress Notes (Signed)
[facility] MD Progress Note  03/29/2016 2:14 PM Gage ELIYA GEIMAN  MRN:  [id number]  Subjective:  Mr. Voit is a 25 year old male with history of schizoaffective disorder admitted for psychotic break in the context of medication noncompliance. He has not been refusing Lithium but took Risperdal. He initially promised to take Lithium tonight before agitation.    As of Saturday the 12th patient interviewed chart reviewed case reviewed with nursing. 25 year old man with schizoaffective disorder. He has no complaints today. His insight is very poor. He remains irritable but he has not been violent or aggressive today. Within the last day he has been aggressive and been breaking property on the unit. Use of one-to-one and security management as well as force medications has improved behavior and safety but he remains psychotic and paranoid. No new physical complaints. Vital signs stable. No sign of bad reaction to medicine.  Principal Problem: Schizoaffective disorder, bipolar type (HCC) Diagnosis:   Patient Active Problem List   Diagnosis Date Noted  . Cannabis use disorder, moderate, dependence (HCC) [F12.20] 03/26/2016  . Tobacco use disorder [F17.200] 03/26/2016  . Schizoaffective disorder, bipolar type (HCC) [F25.0] 03/25/2016  . Involuntary commitment [Z04.6] 03/24/2016   Total Time spent with patient: 20 minutes  Past Psychiatric History: Schizoaffective disorder.  Past Medical History:  Past Medical History:  Diagnosis Date  . Schizo affective schizophrenia [facility])     Past Surgical History:  Procedure Laterality Date  . SKIN GRAFT Left unknown   Pt reports left foot and leg skin graft   Family History: History reviewed. No pertinent family history. Family Psychiatric  History: See H&P. Social History:  History  Alcohol Use No     History  Drug Use  . Types: Marijuana    Comment: "every now and again"    Social History   Social History  . Marital status: Single    Spouse name:  N/A  . Number of children: N/A  . Years of education: N/A   Social History Main Topics  . Smoking status: Current Every Day Smoker    Packs/day: 0.25    Types: Cigarettes  . Smokeless tobacco: Never Used  . Alcohol use No  . Drug use:     Types: Marijuana     Comment: "every now and again"  . Sexual activity: Yes    Birth control/ protection: Condom     Comment: Pt reports he is attracted to men   Other Topics Concern  . None   Social History Narrative  . None   Additional Social History:  Specify valuables returned: Cell phone,wallet,debit card,1 belt and 1 key Pain Medications: see PTA meds Prescriptions: see PTA meds Over the Counter: see PTA meds History of alcohol / drug use?: No history of alcohol / drug abuse                    Sleep: Fair  Appetite:  Fair  Current Medications: Current Facility-Administered Medications  Medication Dose Route Frequency Provider Last Rate Last Dose  . acetaminophen (TYLENOL) tablet 650 mg  650 mg Oral Q6H PRN Audery Amel, MD      . alum & mag hydroxide-simeth (MAALOX/MYLANTA) 200-200-20 MG/5ML suspension 30 mL  30 mL Oral Q4H PRN Audery Amel, MD      . benztropine (COGENTIN) tablet 1 mg  1 mg Oral BID PRN Audery Amel, MD   1 mg at 03/29/16 0917  . diphenhydrAMINE (BENADRYL) capsule 50 mg  50 mg Oral  Q6H PRN Shari Prows, MD   50 mg at 03/28/16 1537   Or  . diphenhydrAMINE (BENADRYL) injection 50 mg  50 mg Intramuscular Q6H PRN Jolanta B Pucilowska, MD      . haloperidol (HALDOL) tablet 10 mg  10 mg Oral Q6H PRN Jolanta B Pucilowska, MD   10 mg at 03/29/16 0916   Or  . haloperidol lactate (HALDOL) injection 10 mg  10 mg Intramuscular Q6H PRN Jolanta B Pucilowska, MD      . lithium carbonate capsule 1,800 mg  1,800 mg Oral QHS Jolanta B Pucilowska, MD   1,800 mg at 03/28/16 2117  . LORazepam (ATIVAN) tablet 2 mg  2 mg Oral Q4H PRN Shari Prows, MD   2 mg at 03/28/16 1537   Or  . LORazepam (ATIVAN)  injection 2 mg  2 mg Intramuscular Q4H PRN Jolanta B Pucilowska, MD      . magnesium hydroxide (MILK OF MAGNESIA) suspension 30 mL  30 mL Oral Daily PRN Audery Amel, MD      . nicotine (NICODERM CQ - dosed in mg/24 hours) patch 21 mg  21 mg Transdermal Daily Jolanta B Pucilowska, MD      . risperiDONE (RISPERDAL) tablet 1 mg  1 mg Oral Q4H PRN Audery Amel, MD      . risperiDONE (RISPERDAL) tablet 2 mg  2 mg Oral BID Shari Prows, MD   2 mg at 03/29/16 0915  . temazepam (RESTORIL) capsule 15 mg  15 mg Oral QHS Shari Prows, MD   15 mg at 03/28/16 2118    Lab Results:  No results found for this or any previous visit (from the past 48 hour(s)).  Blood Alcohol level:  Lab Results  Component Value Date   ETH <5 03/24/2016    Metabolic Disorder Labs: Lab Results  Component Value Date   HGBA1C 6.0 03/26/2016   Lab Results  Component Value Date   PROLACTIN 48.0 (H) 03/26/2016   Lab Results  Component Value Date   CHOL 158 03/26/2016   TRIG 134 03/26/2016   HDL 29 (L) 03/26/2016   CHOLHDL 5.4 03/26/2016   VLDL 27 03/26/2016   LDLCALC 102 (H) 03/26/2016    Physical Findings: AIMS: Facial and Oral Movements Muscles of Facial Expression: None, normal Lips and Perioral Area: None, normal Jaw: None, normal Tongue: None, normal,Extremity Movements Upper (arms, wrists, hands, fingers): None, normal Lower (legs, knees, ankles, toes): None, normal, Trunk Movements Neck, shoulders, hips: None, normal, Overall Severity Severity of abnormal movements (highest score from questions above): None, normal Incapacitation due to abnormal movements: None, normal Patient's awareness of abnormal movements (rate only patient's report): No Awareness, Dental Status Current problems with teeth and/or dentures?: No Does patient usually wear dentures?: No  CIWA:    COWS:     Musculoskeletal: Strength & Muscle Tone: within normal limits Gait & Station: normal Patient leans:  N/A  Psychiatric Specialty Exam: Physical Exam  Nursing note and vitals reviewed. Psychiatric: His affect is blunt and inappropriate. He is slowed. Thought content is paranoid. He expresses inappropriate judgment. He is noncommunicative.    Review of Systems  Constitutional: Negative.   HENT: Negative.   Eyes: Negative.   Respiratory: Negative.   Cardiovascular: Negative.   Gastrointestinal: Negative.   Musculoskeletal: Negative.   Skin: Negative.   Neurological: Negative.   Psychiatric/Behavioral: Positive for hallucinations.  All other systems reviewed and are negative.   Blood pressure 123/77, pulse 77, temperature  98 F (36.7 C), temperature source Oral, resp. rate 18, height 5\' 10"  (1.778 m), weight 92.1 kg (203 lb), SpO2 100 %.Body mass index is 29.13 kg/m.  General Appearance: Casual  Eye Contact:  Good  Speech:  Blocked  Volume:  Normal  Mood:  Euphoric  Affect:  Congruent  Thought Process:  Disorganized  Orientation:  Full (Time, Place, and Person)  Thought Content:  Delusions and Paranoid Ideation  Suicidal Thoughts:  No  Homicidal Thoughts:  No  Memory:  Immediate;   Fair Recent;   Fair Remote;   Fair  Judgement:  Poor  Insight:  Lacking  Psychomotor Activity:  Normal  Concentration:  Concentration: Fair and Attention Span: Fair  Recall:  [employer]  Fund of Knowledge:  Fair  Language:  Poor  Akathisia:  No  Handed:  Right  AIMS (if indicated):     Assets:  Communication Skills Desire for Improvement Financial Resources/Insurance Housing Physical Health Resilience Social Support  ADL's:  Intact  Cognition:  WNL  Sleep:  Number of Hours: 7     Treatment Plan Summary: Daily contact with patient to assess and evaluate symptoms and progress in treatment and Medication management   Mr. Greggory StallionGeorge is a 25 year old male with history of schizophrenia admitted floridly psychotic in the context of medication noncompliance.  1. Agitated behavior. The patient  presented with agitation but it has resolved. Ativan is available.  2. Mood and psychosis. The patient has been treated with a combination of lithium and Risperdal. Lithium level was low on admission. He reports he received Risperdal Consta injection 3 days prior to admission. We restarted lithium and oral Risperdal. I will order all the Lithium at night today.  3. Metabolic syndrome monitoring. Lipid panel, TSH, hemoglobin A1c and prolactin are pending.  4. Smoking. Nicotine patch is available.  5. Insomnia. He did not sleep well with trazodone will start Restoril tonight.  6. Disposition. The patient will be discharged to home. [facility]  Patient interviewed. Patient appears to still be paranoid with poor insight. He is not having obvious akathisia or stiffness. He is becoming more compliant thanks to force medication. I filed my second opinion yesterday and continued to believe that for the sake of safety and his own recovery force medications are necessary. No change to treatment plan or new orders today.  Mordecai RasmussenJohn Clapacs, MD 03/29/2016, 2:14 PM

## 2016-03-29 NOTE — Progress Notes (Addendum)
Pt resting in bed with eyes closed. Security sitter present. Safety maintained.

## 2016-03-29 NOTE — Progress Notes (Signed)
Pt in dayroom. No interaction with peers or staff. Security sitter present.

## 2016-03-29 NOTE — Progress Notes (Addendum)
Patient has been cooperative the last several hours, since this am, Patient has sitter, Patient did go to dining room for lunch, and He is quiet, Patient with angry affect at times. q 15 min. Checks in progress.

## 2016-03-29 NOTE — [facility] Group Notes (Signed)
[facility] Group Notes:  (Nursing/MHT/Case Management/Adjunct)  Date:  03/29/2016  Time:  2:33 AM  Type of Therapy:  Psychoeducational Skills  Participation Level:  Did Not Attend    Summary of Progress/Problems:  Ernest Cook 03/29/2016, 2:33 AM

## 2016-03-29 NOTE — Progress Notes (Signed)
Patient is sitting in room, he took shower early, has 1:1 sitter with security, Patient is calm at times, but can be easily agitated, patient was yelling because He states that phones were cut off when He was talking, Nurse tried to reassure him, but he called nurses and tech all kind of names, cursing and yelling loudly, Nurse was going to let him use the phone again if He would stop yelling, but He did finally calm down when male CNA spoke with him. Patient took His po medications.

## 2016-03-29 NOTE — [facility] Group Notes (Signed)
[facility] Group Notes:  (Nursing/MHT/Case Management/Adjunct)  Date:  03/29/2016  Time:  11:10 AM  Type of Therapy:  goal setting   Participation Level:  Did Not Attend  Twanna Hymanda C Lorcan Shelp 03/29/2016, 11:10 AM

## 2016-03-29 NOTE — Progress Notes (Signed)
Pt in room sitting on bed. Sitter present.

## 2016-03-29 NOTE — Progress Notes (Signed)
Pt in bed resting with eyes closed. Security sitter present. Safety maintained.

## 2016-03-29 NOTE — [facility] Group Notes (Signed)
[facility] LCSW Group Therapy  03/29/2016 2:31 PM  Type of Therapy:  Group Therapy  Participation Level:  Pt did not attend group. CSW invited pt to group.   Summary of Progress/Problems:Self esteem: Patients discussed self esteem and how it impacts them. They discussed what aspects in their lives has influenced their self esteem. They were challenged to identify changes that are needed in order to improve self esteem. Patients participated in activity where they had to identify positive adjectives they felt described their personality. Patients shared with the group on the following areas: Things I am good at, What I like about my appearance, I've helped others by, What I value the most, compliments I have received, challenges I have overcome, thing that make me unique, and Times I've made others happy.    Anely Spiewak G. Adeola Dennen MSW, LCSWA 03/29/2016, 2:31 PM  

## 2016-03-30 NOTE — Progress Notes (Signed)
Patient observed interacting with peers and staff outside his room. No aggressive behaviors. 1:1 staff present.

## 2016-03-30 NOTE — Progress Notes (Signed)
Patient observed in the dayroom interacting with peers and staff. No aggressive behavior noted.

## 2016-03-30 NOTE — Progress Notes (Signed)
Patient seen sitting outside his room talking to staff and a peer. Patient also in group. 1:1 staff present.

## 2016-03-30 NOTE — Plan of Care (Signed)
Problem: Coping: Goal: Ability to cope will improve Outcome: Progressing Patient seen playing cards with peers.

## 2016-03-30 NOTE — [facility] Group Notes (Signed)
[facility] LCSW Group Therapy  03/30/2016 3:33 PM  Type of Therapy:  Group Therapy  Participation Level:  Minimal  Participation Quality:  Attentive  Affect:  Appropriate  Cognitive:  Alert  Insight:  Improving  Engagement in Therapy:  Limited  Modes of Intervention:  Activity, Discussion, Education and Support  Summary of Progress/Problems:Safety Planning: Patients identified fears or worries surrounding discharge. Patients offered support to their peers and openly developed safety plans for their individual needs. Patients developed their own safety plan. Patients discussed their warning signs, coping strategies, support system with family and friends, identified mental health professionals, and how to keep their environments safe (ex. Removing unnecessary medications or removing weapons/guns). Patients then discussed their personalized safety plan with the group. Pt was quiet during group but was willing to share when prompted by CSW. Pt stated his safety plan consisted of contacting his ACT team, taking his medications, and developing positive relationships with his family and friends.    Ernest Cook MSW, LCSWA 03/30/2016, 3:36 PM

## 2016-03-30 NOTE — Progress Notes (Signed)
Patient in the courtyard for group activity. No aggressive behaviors noted. 1:1 staff present.

## 2016-03-30 NOTE — Progress Notes (Signed)
Pt affect is labile. Denies any SI/HI/AVH. Became angry when attempts to call mom were unsuccessful, going to voicemail. Pt states that no one cares about him. Pt needed redirection for being loud in hallways. He stated "Fuck this" and went into room. Later Pt took evening meds and was given PRN ativan for anxiety which was effective. Denies pain and voices no additional concerns at this time. Went to evening group outside and played basketball. Safety maintained. Will continue to monitor.

## 2016-03-30 NOTE — Progress Notes (Signed)
Patient seen in the dayroom for snacks, interacting with peers. After snacks, patient seen in bed sleeping. Chest rise and fall noted.

## 2016-03-30 NOTE — Progress Notes (Signed)
Per 1:1 staff: Patient apologized for his behavior last Friday. He states that he was upset when he saw every one of his close peers leave/get discharged while he was left here. He states that he understands that he needs to take his medications and states that he knows that he will have to take medications "for the rest of his life." He also opened up to staff stating that he was a ward of the state and has been through foster homes. He states that he is trying to accept and develop a relationship with his family including his mother. Patient encouraged to report feelings of agitation to nursing staff rather than resort to aggressive behaviors. He verbalized understanding. Will continue to monitor patient for early signs of agitation and intervene appropriately.

## 2016-03-30 NOTE — Progress Notes (Signed)
Patient resting in bed. At 1100, pt went outside, seen playing basketball with peers.

## 2016-03-30 NOTE — Progress Notes (Signed)
Patient in the courtyard playing and interacting with staff. No aggressive behaviors.

## 2016-03-30 NOTE — [facility] Group Notes (Signed)
[facility] Group Notes:  (Nursing/MHT/Case Management/Adjunct)  Date:  03/30/2016  Time:  12:34 PM  Type of Therapy:  Psychoeducational Skills  Participation Level:  Did Not Attend  Lynelle SmokeCara Travis [facility] 03/30/2016, 12:34 PM

## 2016-03-30 NOTE — Progress Notes (Signed)
[facility] MD Progress Note  03/30/2016 5:21 PM Ernest Cook  MRN:  [id number]  Subjective:  Ernest Cook is a 25 year old male with history of schizoaffective disorder admitted for psychotic break in the context of medication noncompliance. He has not been refusing Lithium but took Risperdal. He initially promised to take Lithium tonight before agitation.    Follow-up Sunday the 13th. Patient appears to be much calmer today. He was actually quite pleasant in his interaction with me. I don't know if he is just trying to cover it up but if so he certainly doing a better job at it. He was not hostile or threatening. Denies feeling paranoid. Says he is feeling much more relaxed. No complaints of any side effects.  Principal Problem: Schizoaffective disorder, bipolar type (HCC) Diagnosis:   Patient Active Problem List   Diagnosis Date Noted  . Cannabis use disorder, moderate, dependence (HCC) [F12.20] 03/26/2016  . Tobacco use disorder [F17.200] 03/26/2016  . Schizoaffective disorder, bipolar type (HCC) [F25.0] 03/25/2016  . Involuntary commitment [Z04.6] 03/24/2016   Total Time spent with patient: 20 minutes  Past Psychiatric History: Schizoaffective disorder.  Past Medical History:  Past Medical History:  Diagnosis Date  . Schizo affective schizophrenia [facility])     Past Surgical History:  Procedure Laterality Date  . SKIN GRAFT Left unknown   Pt reports left foot and leg skin graft   Family History: History reviewed. No pertinent family history. Family Psychiatric  History: See H&P. Social History:  History  Alcohol Use No     History  Drug Use  . Types: Marijuana    Comment: "every now and again"    Social History   Social History  . Marital status: Single    Spouse name: N/A  . Number of children: N/A  . Years of education: N/A   Social History Main Topics  . Smoking status: Current Every Day Smoker    Packs/day: 0.25    Types: Cigarettes  . Smokeless tobacco: Never Used   . Alcohol use No  . Drug use:     Types: Marijuana     Comment: "every now and again"  . Sexual activity: Yes    Birth control/ protection: Condom     Comment: Pt reports he is attracted to men   Other Topics Concern  . None   Social History Narrative  . None   Additional Social History:  Specify valuables returned: Cell phone,wallet,debit card,1 belt and 1 key Pain Medications: see PTA meds Prescriptions: see PTA meds Over the Counter: see PTA meds History of alcohol / drug use?: No history of alcohol / drug abuse                    Sleep: Fair  Appetite:  Fair  Current Medications: Current Facility-Administered Medications  Medication Dose Route Frequency Provider Last Rate Last Dose  . acetaminophen (TYLENOL) tablet 650 mg  650 mg Oral Q6H PRN Audery Amel, MD      . alum & mag hydroxide-simeth (MAALOX/MYLANTA) 200-200-20 MG/5ML suspension 30 mL  30 mL Oral Q4H PRN Audery Amel, MD      . benztropine (COGENTIN) tablet 1 mg  1 mg Oral BID PRN Audery Amel, MD   1 mg at 03/29/16 0917  . diphenhydrAMINE (BENADRYL) capsule 50 mg  50 mg Oral Q6H PRN Jolanta B Pucilowska, MD   50 mg at 03/28/16 1537   Or  . diphenhydrAMINE (BENADRYL) injection 50 mg  50  mg Intramuscular Q6H PRN Jolanta B Pucilowska, MD      . haloperidol (HALDOL) tablet 10 mg  10 mg Oral Q6H PRN Jolanta B Pucilowska, MD   10 mg at 03/29/16 0916   Or  . haloperidol lactate (HALDOL) injection 10 mg  10 mg Intramuscular Q6H PRN Jolanta B Pucilowska, MD      . lithium carbonate capsule 1,800 mg  1,800 mg Oral QHS Jolanta B Pucilowska, MD   1,800 mg at 03/29/16 2100  . LORazepam (ATIVAN) tablet 2 mg  2 mg Oral Q4H PRN Shari Prows, MD   2 mg at 03/29/16 2101   Or  . LORazepam (ATIVAN) injection 2 mg  2 mg Intramuscular Q4H PRN Jolanta B Pucilowska, MD      . magnesium hydroxide (MILK OF MAGNESIA) suspension 30 mL  30 mL Oral Daily PRN Audery Amel, MD      . nicotine (NICODERM CQ - dosed  in mg/24 hours) patch 21 mg  21 mg Transdermal Daily Jolanta B Pucilowska, MD      . risperiDONE (RISPERDAL) tablet 1 mg  1 mg Oral Q4H PRN Audery Amel, MD      . risperiDONE (RISPERDAL) tablet 2 mg  2 mg Oral BID Shari Prows, MD   2 mg at 03/30/16 0914  . temazepam (RESTORIL) capsule 15 mg  15 mg Oral QHS Shari Prows, MD   15 mg at 03/29/16 2101    Lab Results:  No results found for this or any previous visit (from the past 48 hour(s)).  Blood Alcohol level:  Lab Results  Component Value Date   ETH <5 03/24/2016    Metabolic Disorder Labs: Lab Results  Component Value Date   HGBA1C 6.0 03/26/2016   Lab Results  Component Value Date   PROLACTIN 48.0 (H) 03/26/2016   Lab Results  Component Value Date   CHOL 158 03/26/2016   TRIG 134 03/26/2016   HDL 29 (L) 03/26/2016   CHOLHDL 5.4 03/26/2016   VLDL 27 03/26/2016   LDLCALC 102 (H) 03/26/2016    Physical Findings: AIMS: Facial and Oral Movements Muscles of Facial Expression: None, normal Lips and Perioral Area: None, normal Jaw: None, normal Tongue: None, normal,Extremity Movements Upper (arms, wrists, hands, fingers): None, normal Lower (legs, knees, ankles, toes): None, normal, Trunk Movements Neck, shoulders, hips: None, normal, Overall Severity Severity of abnormal movements (highest score from questions above): None, normal Incapacitation due to abnormal movements: None, normal Patient's awareness of abnormal movements (rate only patient's report): No Awareness, Dental Status Current problems with teeth and/or dentures?: No Does patient usually wear dentures?: No  CIWA:    COWS:     Musculoskeletal: Strength & Muscle Tone: within normal limits Gait & Station: normal Patient leans: N/A  Psychiatric Specialty Exam: Physical Exam  Nursing note and vitals reviewed. Psychiatric: His affect is blunt and inappropriate. He is slowed. Thought content is paranoid. He expresses inappropriate  judgment. He is noncommunicative.    Review of Systems  Constitutional: Negative.   HENT: Negative.   Eyes: Negative.   Respiratory: Negative.   Cardiovascular: Negative.   Gastrointestinal: Negative.   Musculoskeletal: Negative.   Skin: Negative.   Neurological: Negative.   Psychiatric/Behavioral: Positive for hallucinations.  All other systems reviewed and are negative.   Blood pressure 121/73, pulse 80, temperature 98.1 F (36.7 C), resp. rate 18, height  (1.778 m), weight 92.1 kg (203 lb), SpO2 100 %.Body mass index is 29.13 kg/m.  General Appearance: Casual  Eye Contact:  Good  Speech:  Blocked  Volume:  Normal  Mood:  Euphoric  Affect:  Congruent  Thought Process:  Disorganized  Orientation:  Full (Time, Place, and Person)  Thought Content:  Delusions and Paranoid Ideation  Suicidal Thoughts:  No  Homicidal Thoughts:  No  Memory:  Immediate;   Fair Recent;   Fair Remote;   Fair  Judgement:  Poor  Insight:  Lacking  Psychomotor Activity:  Normal  Concentration:  Concentration: Fair and Attention Span: Fair  Recall:  [employer]Fair  Fund of Knowledge:  Fair  Language:  Poor  Akathisia:  No  Handed:  Right  AIMS (if indicated):     Assets:  Communication Skills Desire for Improvement Financial Resources/Insurance Housing Physical Health Resilience Social Support  ADL's:  Intact  Cognition:  WNL  Sleep:  Number of Hours: 7.45     Treatment Plan Summary: Daily contact with patient to assess and evaluate symptoms and progress in treatment and Medication management   Ernest Cook is a 25 year old male with history of schizophrenia admitted floridly psychotic in the context of medication noncompliance.  1. Agitated behavior. The patient presented with agitation but it has resolved. Ativan is available.  2. Mood and psychosis. The patient has been treated with a combination of lithium and Risperdal. Lithium level was low on admission. He reports he received  Risperdal Consta injection 3 days prior to admission. We restarted lithium and oral Risperdal. I will order all the Lithium at night today.  3. Metabolic syndrome monitoring. Lipid panel, TSH, hemoglobin A1c and prolactin are pending.  4. Smoking. Nicotine patch is available.  5. Insomnia. He did not sleep well with trazodone will start Restoril tonight.  6. Disposition. The patient will be discharged to home. [facility].  Seems to possibly be a little bit better today or at least able to act better. Encouraged him to keep it up. Continue medicine. No change to plan for today. Ernest RasmussenJohn Loy Mccartt, MD 03/30/2016, 5:21 PM

## 2016-03-30 NOTE — Progress Notes (Signed)
Patient observed interacting with peers and staff outside his room. No aggressive behaviors. 1:1 staff present.  

## 2016-03-30 NOTE — Progress Notes (Signed)
Patient took his medications, he refused his Nicotine patch, states that he was going to quit "cold [place]." Patient states that his mood is "always good." Denies feelings of agitation. Seen in the dayroom playing cards with other peers.

## 2016-03-30 NOTE — Progress Notes (Signed)
Patient had lunch. No aggressive behaviors.

## 2016-03-30 NOTE — Progress Notes (Signed)
Patient seen interacting with peers and staff in the dayroom and outside his room. No aggressive behaviors noted. 1:1 staff present.

## 2016-03-31 DIAGNOSIS — Z9119 Patient's noncompliance with other medical treatment and regimen: Secondary | ICD-10-CM

## 2016-03-31 DIAGNOSIS — Z91199 Patient's noncompliance with other medical treatment and regimen due to unspecified reason: Secondary | ICD-10-CM

## 2016-03-31 MED ORDER — LITHIUM CARBONATE 300 MG PO CAPS: 900.0000 mg | ORAL_CAPSULE | Freq: Every day | ORAL | 0 refills | Status: DC

## 2016-03-31 MED ORDER — LITHIUM CARBONATE 300 MG PO CAPS
900.0000 mg | ORAL_CAPSULE | Freq: Every day | ORAL | Status: DC
  Administered 2016-03-31: 900 mg via ORAL

## 2016-03-31 MED ORDER — TEMAZEPAM 15 MG PO CAPS: 15.0000 mg | ORAL_CAPSULE | Freq: Every day | ORAL | 0 refills | Status: DC

## 2016-03-31 MED ORDER — RISPERIDONE 2 MG PO TABS: 2.0000 mg | ORAL_TABLET | Freq: Two times a day (BID) | ORAL | 0 refills | Status: DC

## 2016-03-31 MED ORDER — BENZTROPINE MESYLATE 1 MG PO TABS: 1.0000 mg | ORAL_TABLET | Freq: Every day | ORAL | 0 refills | Status: DC

## 2016-03-31 NOTE — Progress Notes (Signed)
Pt lying in bed asleep. 1:1 staff present. 

## 2016-03-31 NOTE — Progress Notes (Signed)
Patient in his room talking to staff and peers. No aggressive behaviors noted.

## 2016-03-31 NOTE — Progress Notes (Signed)
Pt lying in bed asleep. 1:1 staff present.

## 2016-03-31 NOTE — Plan of Care (Signed)
Problem: Coping: Goal: Ability to verbalize frustrations and anger appropriately will improve Outcome: Progressing Patient states that he will speak to staff when he starts to get agitated. He verbalized understanding.

## 2016-03-31 NOTE — Progress Notes (Signed)
Patient seen interacting with peers and staff. No aggressive behaviors noted. 1:1 staff present.

## 2016-03-31 NOTE — Progress Notes (Signed)
Patient interacting with peers, no aggressive behaviors being exhibited. 1:1 Sitter discontinued. Patient encouraged to verbalize feelings to nursing staff. He verbalized understanding.

## 2016-03-31 NOTE — Progress Notes (Signed)
Pt sitting in green hallway, outside of room, talking with peer. 1:1 staff present.

## 2016-03-31 NOTE — Progress Notes (Signed)
Patient in the dayroom watching tv and interacting with peers. No aggressive behaviors.

## 2016-03-31 NOTE — Progress Notes (Signed)
Patient talking and interacting with staff and peers. Attended groups. No aggressive behaviors. 1:1 staff present.

## 2016-03-31 NOTE — [facility] Group Notes (Signed)
[facility] Group Notes:  (Nursing/MHT/Case Management/Adjunct)  Date:  03/31/2016  Time:  2:56 AM  Type of Therapy:  Group Therapy  Participation Level:  Active  Participation Quality:  Appropriate  Affect:  Appropriate  Cognitive:  Appropriate  Insight:  Appropriate  Engagement in Group:  Engaged  Modes of Intervention:  Discussion  Summary of Progress/Problems: Pt was very pleasant in group. Stated that interacting with his fellow pt's had made his day easier.   Fanny Skatesshley Imani Deangleo Passage 03/31/2016, 2:56 AM

## 2016-03-31 NOTE — Progress Notes (Signed)
Pt in group, down yellow hall. 1:1 staff present.

## 2016-03-31 NOTE — Progress Notes (Signed)
Pt lying in bed awake. 1:1 sitter present.

## 2016-03-31 NOTE — Progress Notes (Signed)
D: Observed pt in hallway with security and 1:1 sitter. Patient alert and oriented x4. Patient denies SI/HI/AVH. Pt affect is appropriate. Pt forwards little with staff, but stated his day was "all right." Pt attended evening group. Pt had no outbursts this evening. Pt interacted well with peers and staff this evening. Pt had no complaints. A: Offered active listening and support. Provided therapeutic communication. Administered scheduled medications.  R: Pt pleasant and cooperative. Pt medication compliant. Will continue Q15 min. checks. Safety maintained. 1:1 sitter maintained.

## 2016-03-31 NOTE — Progress Notes (Signed)
Patient in the dayroom with peers, no aggressive behaviors.

## 2016-03-31 NOTE — Progress Notes (Signed)
Pt sitting in green hallway, outside of room, talking with peer. 1:1 staff present. 

## 2016-03-31 NOTE — Progress Notes (Signed)
Patient in the dayroom eating lunch. No aggressive behaviors noted. 1:1 staff present.

## 2016-03-31 NOTE — Progress Notes (Signed)
[facility]  03/31/2016 2:35 PM Ernest Cook  MRN:  [id number]  Subjective:  Ernest Cook reports feeling much better today. His agitation is resolved. There were no behavioral problems over the weekend. We will discontinue sitter. The patient also is very apologetic about his behavior on Friday. He knows he lost his composure. He now accepts the fact that he needs to take medications and especially the lithium and has been compliant for the past 2 days. He's been on Risperdal for many years and feels that this also is a good medicine for him. Sleep and appetite are good. There are no somatic complaints. He participates well in programming. Anticipated discharge tomorrow.   Principal Problem: Schizoaffective disorder, bipolar type (HCC) Diagnosis:   Patient Active Problem List   Diagnosis Date Noted  . Schizoaffective disorder, bipolar type (HCC) [F25.0] 03/25/2016    Priority: High  . Cannabis use disorder, moderate, dependence (HCC) [F12.20] 03/26/2016  . Tobacco use disorder [F17.200] 03/26/2016  . Involuntary commitment [Z04.6] 03/24/2016   Total Time spent with patient: 20 minutes  Past Psychiatric History: Schizoaffective disorder.  Past Medical History:  Past Medical History:  Diagnosis Date  . Schizo affective schizophrenia [facility](HCC)     Past Surgical History:  Procedure Laterality Date  . SKIN GRAFT Left unknown   Pt reports left foot and leg skin graft   Family History: History reviewed. No pertinent family history. Family Psychiatric  History: See H&P. Social History:  History  Alcohol Use No     History  Drug Use  . Types: Marijuana    Comment: "every now and again"    Social History   Social History  . Marital status: Single    Spouse name: N/A  . Number of children: N/A  . Years of education: N/A   Social History Main Topics  . Smoking status: Current Every Day Smoker    Packs/day: 0.25    Types: Cigarettes  . Smokeless tobacco: Never Used  .  Alcohol use No  . Drug use:     Types: Marijuana     Comment: "every now and again"  . Sexual activity: Yes    Birth control/ protection: Condom     Comment: Pt reports he is attracted to men   Other Topics Concern  . None   Social History Narrative  . None   Additional Social History:  Specify valuables returned: Cell phone,wallet,debit card,1 belt and 1 key Pain Medications: see PTA meds Prescriptions: see PTA meds Over the Counter: see PTA meds History of alcohol / drug use?: No history of alcohol / drug abuse                    Sleep: Fair  Appetite:  Fair  Current Medications: Current Facility-Administered Medications  Medication Dose Route Frequency Provider Last Rate Last Dose  . acetaminophen (TYLENOL) tablet 650 mg  650 mg Oral Q6H PRN Audery AmelJohn T Clapacs, MD      . alum & mag hydroxide-simeth (MAALOX/MYLANTA) 200-200-20 MG/5ML suspension 30 mL  30 mL Oral Q4H PRN Audery AmelJohn T Clapacs, MD      . benztropine (COGENTIN) tablet 1 mg  1 mg Oral BID PRN Audery AmelJohn T Clapacs, MD   1 mg at 03/29/16 0917  . diphenhydrAMINE (BENADRYL) capsule 50 mg  50 mg Oral Q6H PRN Tomisha Reppucci B Bianco Cange, MD   50 mg at 03/28/16 1537   Or  . diphenhydrAMINE (BENADRYL) injection 50 mg  50 mg Intramuscular  Q6H PRN Shari Prows, MD      . haloperidol (HALDOL) tablet 10 mg  10 mg Oral Q6H PRN Marquasha Brutus B Dontasia Miranda, MD   10 mg at 03/29/16 0916   Or  . haloperidol lactate (HALDOL) injection 10 mg  10 mg Intramuscular Q6H PRN Cybil Senegal B Nadean Montanaro, MD      . lithium carbonate capsule 1,800 mg  1,800 mg Oral QHS Thressa Shiffer B Jusiah Aguayo, MD   1,800 mg at 03/30/16 2123  . LORazepam (ATIVAN) tablet 2 mg  2 mg Oral Q4H PRN Shari Prows, MD   2 mg at 03/29/16 2101   Or  . LORazepam (ATIVAN) injection 2 mg  2 mg Intramuscular Q4H PRN Markel Mergenthaler B Gracen Ringwald, MD      . magnesium hydroxide (MILK OF MAGNESIA) suspension 30 mL  30 mL Oral Daily PRN Audery Amel, MD      . nicotine (NICODERM CQ - dosed in  mg/24 hours) patch 21 mg  21 mg Transdermal Daily Keywon Mestre B Luisfernando Brightwell, MD      . risperiDONE (RISPERDAL) tablet 1 mg  1 mg Oral Q4H PRN Audery Amel, MD      . risperiDONE (RISPERDAL) tablet 2 mg  2 mg Oral BID Shari Prows, MD   2 mg at 03/31/16 0905  . temazepam (RESTORIL) capsule 15 mg  15 mg Oral QHS Shari Prows, MD   15 mg at 03/30/16 2124    Lab Results:  No results found for this or any previous visit (from the past 48 hour(s)).  Blood Alcohol level:  Lab Results  Component Value Date   ETH <5 03/24/2016    Metabolic Disorder Labs: Lab Results  Component Value Date   HGBA1C 6.0 03/26/2016   Lab Results  Component Value Date   PROLACTIN 48.0 (H) 03/26/2016   Lab Results  Component Value Date   CHOL 158 03/26/2016   TRIG 134 03/26/2016   HDL 29 (L) 03/26/2016   CHOLHDL 5.4 03/26/2016   VLDL 27 03/26/2016   LDLCALC 102 (H) 03/26/2016    Physical Findings: AIMS: Facial and Oral Movements Muscles of Facial Expression: None, normal Lips and Perioral Area: None, normal Jaw: None, normal Tongue: None, normal,Extremity Movements Upper (arms, wrists, hands, fingers): None, normal Lower (legs, knees, ankles, toes): None, normal, Trunk Movements Neck, shoulders, hips: None, normal, Overall Severity Severity of abnormal movements (highest score from questions above): None, normal Incapacitation due to abnormal movements: None, normal Patient's awareness of abnormal movements (rate only patient's report): No Awareness, Dental Status Current problems with teeth and/or dentures?: No Does patient usually wear dentures?: No  CIWA:    COWS:     Musculoskeletal: Strength & Muscle Tone: within normal limits Gait & Station: normal Patient leans: N/A  Psychiatric Specialty Exam: Physical Exam  Nursing note and vitals reviewed.   Review of Systems  All other systems reviewed and are negative.   Blood pressure 117/76, pulse 77, temperature 98 F (36.7  C), temperature source Oral, resp. rate 16, height 5\' 10"  (1.778 m), weight 92.1 kg (203 lb), SpO2 100 %.Body mass index is 29.13 kg/m.  General Appearance: Casual  Eye Contact:  Good  Speech:  Blocked  Volume:  Normal  Mood:  Euphoric  Affect:  Congruent  Thought Process:  Goal Directed  Orientation:  Full (Time, Place, and Person)  Thought Content:  WDL, Delusions and Paranoid Ideation  Suicidal Thoughts:  No  Homicidal Thoughts:  No  Memory:  Immediate;  Fair Recent;   Fair Remote;   Fair  Judgement:  Impaired  Insight:  Lacking and Shallow  Psychomotor Activity:  Normal  Concentration:  Concentration: Fair and Attention Span: Fair  Recall:  [employer]Fair  Fund of Knowledge:  Fair  Language:  Poor  Akathisia:  No  Handed:  Right  AIMS (if indicated):     Assets:  Communication Skills Desire for Improvement Financial Resources/Insurance Housing Physical Health Resilience Social Support  ADL's:  Intact  Cognition:  WNL  Sleep:  Number of Hours: 7     Treatment Plan Summary: Daily contact with patient to assess and evaluate symptoms and progress in treatment and Medication management   Ernest Cook is a 25 year old male with history of schizophrenia admitted floridly psychotic in the context of medication noncompliance.  1. Agitated behavior. This has resolved. I will discontinue sitter.  2. Mood and psychosis. The patient has been treated with a combination of lithium and Risperdal. Lithium level was low on admission. He reports he received Risperdal Consta injection 3 days prior to admission. We restarted lithium and oral Risperdal. Lithium level in am.   3. Metabolic syndrome monitoring. Lipid panel, TSH, and hemoglobin A1c are normal. Prolactin 48.   4. Smoking. Nicotine patch is available.  5. Insomnia. He responded well to Restoril.   6. Disposition. The patient will be discharged to home. [facility]  Kristine LineaJolanta Tobey Lippard,  MD 03/31/2016, 2:35 PM

## 2016-03-31 NOTE — Progress Notes (Signed)
Patient medications administered. Patient states that he plans to attend all groups and take his medications so that he can be discharged. Patient states that his mood is "fine" but continues to state that he wants to be discharged today. Patient encouraged and praised to keep up with the good work(groups, medication compliance, safety). No aggressive behaviors noted.

## 2016-03-31 NOTE — Progress Notes (Signed)
Recreation Therapy Notes  Date: 08.14.17 Time: 9:30 am Location: Craft Room  Group Topic: Self-expression  Goal Area(s) Addresses:  Patient will identify one color per emotion listed on wheel. Patient will verbalize one emotion experienced during session. Patient will be educated on other forms of self-expression.  Behavioral Response: Attentive, Interactive  Intervention: Emotion Wheel  Activity: Patients were given an Arboriculturistmotion Wheel worksheet and instructed to pick a color for each emotion listed on the wheel.  Education: LRT educated patients on different forms of self-expression.  Education Outcome: Acknowledges education/In group clarification offered   Clinical Observations/Feedback: Patient completed activity by picking a color for each emotion. Patient contributed to group discussion by stating what color he picked and why, that it felt good to see his emotions in color, and what emotion he felt during group.  Jacquelynn CreeGreene,Jaleiah Asay M, LRT/CTRS 03/31/2016 10:26 AM

## 2016-03-31 NOTE — Plan of Care (Signed)
Problem: Self-Care: Goal: Ability to participate in self-care as condition permits will improve Outcome: Progressing Pt has appropriate hygiene practices.

## 2016-04-01 LAB — BASIC METABOLIC PANEL
Anion gap: 6 (ref 5–15)
BUN: 12 mg/dL (ref 6–20)
CALCIUM: 9.4 mg/dL (ref 8.9–10.3)
CO2: 25 mmol/L (ref 22–32)
CREATININE: 0.87 mg/dL (ref 0.61–1.24)
Chloride: 105 mmol/L (ref 101–111)
Glucose, Bld: 96 mg/dL (ref 65–99)
Potassium: 4.2 mmol/L (ref 3.5–5.1)
SODIUM: 136 mmol/L (ref 135–145)

## 2016-04-01 LAB — LITHIUM LEVEL: Lithium Lvl: 1.1 mmol/L (ref 0.60–1.20)

## 2016-04-01 NOTE — Progress Notes (Signed)
D: Pt denies SI/HI/AVH. Pt is pleasant and cooperative. Pt stated he feels better from doing taking his medication, he appears less anxious and he is interacting with peers and staff appropriately.  A: Pt was offered support and encouragement. Pt was given scheduled medications. Pt was encouraged to attend groups. Q 15 minute checks were done for safety.  R:Pt attends groups and interacts well with peers and staff. Pt is taking medication. Pt has no complaints.Pt receptive to treatment and safety maintained on unit.

## 2016-04-01 NOTE — Plan of Care (Signed)
Problem: Activity: Goal: Will verbalize the importance of balancing activity with adequate rest periods Outcome: Progressing Patient verbalizing importance of balancing of activity with rest period.

## 2016-04-01 NOTE — Discharge Summary (Signed)
Physician Discharge Summary Note  Patient:  Ernest Cook is an 25 y.o., male MRN:  [id number] DOB:  04/23/1991 Patient phone:  [phone] (home)  Patient address:   [address] [place] [address],  Total Time spent with patient: 30 minutes  Date of Admission:  03/25/2016 Date of Discharge: 04/01/2016  Reason for Admission:  Psychotic break.  Identifying data. [name] is a 25 year old male with a history of schizophrenia.  Chief complaint. "It's for love."  History of present illness. Information was obtained from the patient and the chart. The patient has a long history of schizophrenia but has not been able to provide any details as he is a poor historian and preoccupied with his delusions. He was brought to the emergency room by police after he was found walking to other people's houses looking for his daughter. They were all strangers and reported him to the police. In the emergency room the patient was initially agitated and was held in the ER for couple days before he settled down. He admits that for the past 2 weeks he has not been taking his lithium. He cannot explain why. Lithium level on admission was very low. He has been on Risperdal injections and reportedly got his last injection 3 days prior to admission. He complains that Risperdal has not been working and that he needs to take pills on the top of injections. He denies any symptoms of depression, anxiety, or psychosis. He explains that he is looking for love, and this is all that matters. He is a homosexual and has not been in a relationship. He was positive for marijuana on admission but denies using alcohol or other illicit drugs.  Past psychiatric history. The patient is unable to provide any details. We know that he has [facility] and has been on lithium and Risperdal. He is unable to provide any information about his hospitalizations.  Family psychiatric history. Unknown.  Social history. He is  disabled from mental illness. He lives independently in a house in [place]. He is gay. He has support from his mother and father as well as his ACT team.  Principal Problem: Schizoaffective disorder, bipolar type [facility](HCC) Discharge Diagnoses: Patient Active Problem List   Diagnosis Date Noted  . Schizoaffective disorder, bipolar type (HCC) [F25.0] 03/25/2016    Priority: High  . Noncompliance [Z91.19] 03/31/2016  . Cannabis use disorder, moderate, dependence (HCC) [F12.20] 03/26/2016  . Tobacco use disorder [F17.200] 03/26/2016  . Involuntary commitment [Z04.6] 03/24/2016   Past Medical History:  Past Medical History:  Diagnosis Date  . Schizo affective schizophrenia [facility](HCC)     Past Surgical History:  Procedure Laterality Date  . SKIN GRAFT Left unknown   Pt reports left foot and leg skin graft   Family History: History reviewed. No pertinent family history.  Social History:  History  Alcohol Use No     History  Drug Use  . Types: Marijuana    Comment: "every now and again"    Social History   Social History  . Marital status: Single    Spouse name: N/A  . Number of children: N/A  . Years of education: N/A   Social History Main Topics  . Smoking status: Current Every Day Smoker    Packs/day: 0.25    Types: Cigarettes  . Smokeless tobacco: Never Used  . Alcohol use No  . Drug use:     Types: Marijuana     Comment: "every now and again"  .  Sexual activity: Yes    Birth control/ protection: Condom     Comment: Pt reports he is attracted to men   Other Topics Concern  . None   Social History Narrative  . None    Hospital Course:    [name] is a 25 year old male with history of schizophrenia admitted floridly psychotic in the context of medication noncompliance.  1. Agitated behavior. This has resolved.   2. Mood and psychosis. The patient has been treated with a combination of lithium and Risperdal. Lithium level was low on admission. Risperdal  Consta injection was given 3 days prior to admission. We restarted lithium and oral Risperdal. Lithium level 1.1.    3. Metabolic syndrome monitoring. Lipid panel, TSH, and hemoglobin A1c are normal. Prolactin 48.   4. Smoking. Nicotine patch is available.  5. Insomnia. He responded well to Restoril.   6. Disposition. The patient was discharged to home with family. [facility].  Physical Findings: AIMS: Facial and Oral Movements Muscles of Facial Expression: None, normal Lips and Perioral Area: None, normal Jaw: None, normal Tongue: None, normal,Extremity Movements Upper (arms, wrists, hands, fingers): None, normal Lower (legs, knees, ankles, toes): None, normal, Trunk Movements Neck, shoulders, hips: None, normal, Overall Severity Severity of abnormal movements (highest score from questions above): None, normal Incapacitation due to abnormal movements: None, normal Patient's awareness of abnormal movements (rate only patient's report): No Awareness, Dental Status Current problems with teeth and/or dentures?: No Does patient usually wear dentures?: No  CIWA:    COWS:     Musculoskeletal: Strength & Muscle Tone: within normal limits Gait & Station: normal Patient leans: N/A  Psychiatric Specialty Exam: Physical Exam  Nursing note and vitals reviewed.   Review of Systems  All other systems reviewed and are negative.   Blood pressure 131/72, pulse 64, temperature 98.2 F (36.8 C), resp. rate 18, height 5\' 10"  (1.778 m), weight 92.1 kg (203 lb), SpO2 100 %.Body mass index is 29.13 kg/m.  See SRA.                                                  Sleep:  Number of Hours: 7     Have you used any form of tobacco in the last 30 days? (Cigarettes, Smokeless Tobacco, Cigars, and/or Pipes): Yes  Has this patient used any form of tobacco in the last 30 days? (Cigarettes, Smokeless Tobacco, Cigars, and/or Pipes) Yes, Yes,  A prescription for an FDA-approved tobacco cessation medication was offered at discharge and the patient refused  Blood Alcohol level:  Lab Results  Component Value Date   [facility] <5 03/24/2016    Metabolic Disorder Labs:  Lab Results  Component Value Date   HGBA1C 6.0 03/26/2016   Lab Results  Component Value Date   PROLACTIN 48.0 (H) 03/26/2016   Lab Results  Component Value Date   CHOL 158 03/26/2016   TRIG 134 03/26/2016   HDL 29 (L) 03/26/2016   CHOLHDL 5.4 03/26/2016   VLDL 27 03/26/2016   LDLCALC 102 (H) 03/26/2016    See Psychiatric Specialty Exam and Suicide Risk Assessment completed by Attending Physician prior to discharge.  Discharge destination:  Home  Is patient on multiple antipsychotic therapies at discharge:  No   Has Patient had three or more failed trials of antipsychotic  monotherapy by history:  No  Recommended Plan for Multiple Antipsychotic Therapies: NA  Discharge Instructions    Diet - low sodium heart healthy    Complete by:  As directed   Increase activity slowly    Complete by:  As directed       Medication List    STOP taking these medications   traZODone 50 MG tablet Commonly known as:  DESYREL     TAKE these medications     Indication  atomoxetine 40 MG capsule Commonly known as:  STRATTERA Take 40 mg by mouth daily.  Indication:  Attention Deficit Hyperactivity Disorder   benztropine 1 MG tablet Commonly known as:  COGENTIN Take 1 tablet (1 mg total) by mouth at bedtime.  Indication:  Extrapyramidal Reaction caused by Medications   docusate sodium 100 MG capsule Commonly known as:  COLACE Take 100 mg by mouth daily as needed for mild constipation.  Indication:  Constipation   lithium carbonate 300 MG capsule Take 3 capsules (900 mg total) by mouth at bedtime.  Indication:  Manic Phase of Manic-Depression   risperiDONE 1 MG tablet Commonly known as:  RISPERDAL Take 1 mg by mouth daily as needed. What changed:  Another  medication with the same name was added. Make sure you understand how and when to take each.  Indication:  Psychosis   risperiDONE 2 MG tablet Commonly known as:  RISPERDAL Take 1 tablet (2 mg total) by mouth 2 (two) times daily. What changed:  You were already taking a medication with the same name, and this prescription was added. Make sure you understand how and when to take each.  Indication:  Schizophrenia   risperiDONE microspheres 50 MG injection Commonly known as:  RISPERDAL CONSTA Inject 50 mg into the muscle every 14 (fourteen) days.  Indication:  Schizophrenia   temazepam 15 MG capsule Commonly known as:  RESTORIL Take 1 capsule (15 mg total) by mouth at bedtime.  Indication:  Trouble Sleeping        Follow-up recommendations:  Activity:  as tolerated. Diet:  regular. Other:  keep follow up appointments.  Comments:    Signed: Kristine Linea, MD 04/01/2016, 8:58 AM

## 2016-04-01 NOTE — Tx Team (Addendum)
Interdisciplinary Treatment Plan Update (Adult)         Date: 04/01/2016   Time Reviewed: 10:30 AM   Progress in Treatment: Improving Attending groups: Yes  Participating in groups: Yes  Taking medication as prescribed: Yes  Tolerating medication: Yes  Family/Significant other contact made: CSW awaiting return call from collateral  Patient understands diagnosis: Yes  Discussing patient identified problems/goals with staff: Yes  Medical problems stabilized or resolved: Yes  Denies suicidal/homicidal ideation: Yes  Issues/concerns per patient self-inventory: Yes  Other:   New problem(s) identified: N/A   Discharge Plan or Barriers: see below   Reason for Continuation of Hospitalization:   Depression   Anxiety   Medication Stabilization   Comments: N/A   Estimated length of stay: 7 days    Patient is a 25 year old male admitted for bizarre behavior. Patient lives in [place], [place].   Patient was seen yesterday in the emergency room after being brought into the hospital psychotic. Decision was made to admit him to the psychiatric hospital. Concern was expressed by treatment team about possible agitation. On reevaluation today the patient denies any wish to harm anyone. Today he has not been physically aggressive although at times he has been walking out of his room. Has responded okay to verbal redirection. On at least one occasion earlier today I heard him make a whooping sound but reviewed today he is calm. Thoughts are still disorganized. Affect euthymic. Has been taking medication. Patient will benefit from crisis stabilization, medication evaluation, group therapy, and psycho education in addition to case management for discharge planning. Patient and CSW reviewed pt's identified goals and treatment plan. Pt verbalized understanding and agreed to treatment plan.    Review of initial/current patient goals per problem list:  1. Goal(s): Patient will participate in  aftercare plan   Met: Yes  Target date: 3-5 days post admission date   As evidenced by: Patient will participate within aftercare plan AEB aftercare provider and housing plan.  8/15 : Pt will follow up with [place] Team and will return to [place] to live with his family.   .  2. Goal (s): Patient will exhibit decreased depressive symptoms and suicidal ideations.   Met: Adequate for discharge per MD.  Target date: 3-5 days post admission date   As evidenced by: Patient will utilize self-rating of depression at 3 or below and demonstrate decreased signs of depression or be deemed stable for discharge by MD.   Pt reports that he is "love struck" and displays depressive symptoms. Rates depression score of 6 at this time.  8/15: Adequate for discharge per MD.  3. Goal(s): Patient will demonstrate decreased signs and symptoms of anxiety.   Met: Adequate for discharge per MD.  Target date: 3-5 days post admission date   As evidenced by: Patient will utilize self-rating of anxiety at 3 or below and demonstrated decreased signs of anxiety, or be deemed stable for discharge by MD   Pt reports an anxiety score of 5 at this time.  8/15: Adequate for discharge per MD.  Pt reports baseline symptoms of anxiety.     4. Goal(s): Patient will demonstrate decreased signs of psychosis  * Met: Adequate for discharge per MD. * Target date: 3-5 days post admission date  * As evidenced by: Patient will demonstrate decreased frequency of AVH or return to baseline function   Pt is still experiencing psychosis and delusions at this time.  8/15: 8/15: Adequate for discharge per  MD. Pt denies AVH  5. Goal (s): Patient will demonstrate decreased signs of mania  * Met: Adequate for discharge per MD. * Target date: 3-5 days post admission date  * As evidenced by: Patient demonstrate decreased signs of mania AEB decreased mood instability and demonstration of stable mood   Pt mood is  still unstable at this time. Still presents with manic symptoms.  8/15: Adequate for discharge per MD.  Attendees: Patient:  Ernest Cook 8/15/201710:53 AM  Family:   8/15/201710:53 AM  Physician:  Dr. Bary Leriche, MD 8/15/201710:53 AM  Nursing:   Polly Cobia, RN 8/15/201710:53 AM  Case Manager:   8/15/201710:53 AM  Counselor:   8/15/201710:53 AM  Other:  Everitt Amber, LRT 8/15/201710:53 AM  Other:  Lear Ng. Claybon Jabs MSW, LCSWA 8/15/201710:53 AM  Other:  Dossie Arbour, LCSW 8/15/201710:53 AM  Other:  8/15/201710:53 AM  Other:  8/15/201710:53 AM  Other:  8/15/201710:53 AM  Other:  8/15/201710:53 AM  Other:  8/15/201710:53 AM  Other:  8/15/201710:53 AM  Other:   8/15/201710:53 AM   Scribe for Treatment Team:   Alphonse Guild Vyla Pint,  04/01/2016, 10:53 AM

## 2016-04-01 NOTE — [facility] Suicide Risk Assessment (Signed)
[facility]   Principal Problem: Schizoaffective disorder, bipolar type [facility](HCC) Discharge Diagnoses:  Patient Active Problem List   Diagnosis Date Noted  . Schizoaffective disorder, bipolar type (HCC) [F25.0] 03/25/2016    Priority: High  . Noncompliance [Z91.19] 03/31/2016  . Cannabis use disorder, moderate, dependence (HCC) [F12.20] 03/26/2016  . Tobacco use disorder [F17.200] 03/26/2016  . Involuntary commitment [Z04.6] 03/24/2016    Total Time spent with patient: 30 minutes  Musculoskeletal: Strength & Muscle Tone: within normal limits Gait & Station: normal Patient leans: N/A  Psychiatric Specialty Exam: Review of Systems  All other systems reviewed and are negative.   Blood pressure 131/72, pulse 64, temperature 98.2 F (36.8 C), resp. rate 18, height 5\' 10"  (1.778 m), weight 92.1 kg (203 lb), SpO2 100 %.Body mass index is 29.13 kg/m.  General Appearance: Casual  Eye Contact::  Good  Speech:  Clear and Coherent409  Volume:  Normal  Mood:  Euthymic  Affect:  Appropriate  Thought Process:  Goal Directed  Orientation:  Full (Time, Place, and Person)  Thought Content:  WDL  Suicidal Thoughts:  No  Homicidal Thoughts:  No  Memory:  Immediate;   Fair Recent;   Fair Remote;   Fair  Judgement:  Impaired  Insight:  Shallow  Psychomotor Activity:  Normal  Concentration:  Fair  Recall:  Fis[employer]Fair  Fund of Knowledge:Fair  Language: Fair  Akathisia:  No  Handed:  Right  AIMS (if indicated):     Assets:  Communication Skills Desire for Improvement Financial Resources/Insurance Housing Physical Health Resilience Social Support  Sleep:  Number of Hours: 7  Cognition: WNL  ADL's:  Intact   Mental Status Per Nursing Assessment::   On Admission:  NA  Demographic Factors:  Male, Adolescent or young adult and Living alone  Loss Factors: NA  Historical Factors: Prior suicide attempts and Impulsivity  Risk Reduction Factors:   Sense of  responsibility to family, Positive social support and Positive therapeutic relationship  Continued Clinical Symptoms:  Bipolar Disorder:   Mixed State Schizophrenia:   Less than 27 years old Paranoid or undifferentiated type  Cognitive Features That Contribute To Risk:  None    Suicide Risk:  Minimal: No identifiable suicidal ideation.  Patients presenting with no risk factors but with morbid ruminations; may be classified as minimal risk based on the severity of the depressive symptoms    Plan Of Care/Follow-up recommendations:  Activity:  as tolerated. Diet:  regular. Other:  keep follow up appointments.  Kristine LineaJolanta Tearah Saulsbury, MD 04/01/2016, 8:55 AM

## 2016-04-01 NOTE — Progress Notes (Signed)
Patient's discharge instructions, medications and follow-up appointments discussed with the patient. He verbalized understanding. Patient's property was given back to him, he did not voice any concerns. He was escorted to the lobby where family was waiting for him. He was very excited to leave and promised staff to do well.

## 2016-04-01 NOTE — Progress Notes (Signed)
  [facility] :  Will you be returning to the same living situation after discharge:  Yes,  pt will return home to [place] to live with his family At discharge, do you have transportation home?: Yes,  pt will be picked up by his aunt Do you have the ability to pay for your medications: Yes,  pt will be provided with prescriptions at discharge  Release of information consent forms completed and in the chart;  Patient's signature needed at discharge.  Patient to Follow up at: Follow-up Information    [facility] [place] .   Why:  Your ACT Team will arrive at your home on Wednesday, August 16th, 2017 for your hospital follow up for medication management, substance abuse treatment and therapy Contact information: [facility] [place] [address] [place], [place] [address] Ph: [phone] Fax: [phone]          Next level of care provider has [facility] to [facility] Link:no  Safety Planning and Suicide Prevention discussed: Yes,  completed with pt  Have you used any form of tobacco in the last 30 days? (Cigarettes, Smokeless Tobacco, Cigars, and/or Pipes): Yes  Has patient been referred to the Quitline?: Patient refused referral  Patient has been referred for addiction treatment: Yes  Ernest PeaJonathan F Hydia Cook 04/01/2016, 3:11 PM

## 2020-04-01 ENCOUNTER — Emergency Department
Admission: EM | Admit: 2020-04-01 | Discharge: 2020-04-01 | Disposition: A | Discharge status: Other Acute Inpatient Hospital | Payer: Medicaid Other | Attending: Student in an Organized Health Care Education/Training Program | Admitting: Student in an Organized Health Care Education/Training Program

## 2020-04-01 ENCOUNTER — Other Ambulatory Visit: Payer: Self-pay

## 2020-04-01 ENCOUNTER — Inpatient Hospital Stay
Admission: RE | Admit: 2020-04-01 | Discharge: 2020-04-04 | DRG: 885 | Disposition: A | Discharge status: Home / Self Care | Payer: Medicaid Other | Source: Intra-hospital | Attending: Psychiatry | Admitting: Psychiatry

## 2020-04-01 DIAGNOSIS — F259 Schizoaffective disorder, unspecified: Secondary | ICD-10-CM | POA: Diagnosis present

## 2020-04-01 DIAGNOSIS — Z79899 Other long term (current) drug therapy: Secondary | ICD-10-CM | POA: Diagnosis not present

## 2020-04-01 DIAGNOSIS — R451 Restlessness and agitation: Secondary | ICD-10-CM | POA: Diagnosis present

## 2020-04-01 DIAGNOSIS — F23 Brief psychotic disorder: Secondary | ICD-10-CM | POA: Diagnosis not present

## 2020-04-01 DIAGNOSIS — F17210 Nicotine dependence, cigarettes, uncomplicated: Secondary | ICD-10-CM | POA: Diagnosis present

## 2020-04-01 DIAGNOSIS — F250 Schizoaffective disorder, bipolar type: Secondary | ICD-10-CM | POA: Diagnosis not present

## 2020-04-01 DIAGNOSIS — Z20822 Contact with and (suspected) exposure to covid-19: Secondary | ICD-10-CM | POA: Diagnosis present

## 2020-04-01 DIAGNOSIS — Z046 Encounter for general psychiatric examination, requested by authority: Secondary | ICD-10-CM | POA: Diagnosis present

## 2020-04-01 LAB — LITHIUM LEVEL: Lithium Lvl: 0.24 mmol/L — ABNORMAL LOW (ref 0.60–1.20)

## 2020-04-01 LAB — CBC
HCT: 41.8 % (ref 39.0–52.0)
Hemoglobin: 13.8 g/dL (ref 13.0–17.0)
MCH: 27.1 pg (ref 26.0–34.0)
MCHC: 33 g/dL (ref 30.0–36.0)
MCV: 82.1 fL (ref 80.0–100.0)
Platelets: 341 10*3/uL (ref 150–400)
RBC: 5.09 MIL/uL (ref 4.22–5.81)
RDW: 15.5 % (ref 11.5–15.5)
WBC: 13.8 10*3/uL — ABNORMAL HIGH (ref 4.0–10.5)
nRBC: 0 % (ref 0.0–0.2)

## 2020-04-01 LAB — COMPREHENSIVE METABOLIC PANEL
ALT: 19 U/L (ref 0–44)
AST: 26 U/L (ref 15–41)
Albumin: 4.7 g/dL (ref 3.5–5.0)
Alkaline Phosphatase: 57 U/L (ref 38–126)
Anion gap: 9 (ref 5–15)
BUN: 11 mg/dL (ref 6–20)
CO2: 26 mmol/L (ref 22–32)
Calcium: 9.3 mg/dL (ref 8.9–10.3)
Chloride: 104 mmol/L (ref 98–111)
Creatinine, Ser: 0.95 mg/dL (ref 0.61–1.24)
GFR calc Af Amer: >60 mL/min (ref >60)
GFR calc non Af Amer: >60 mL/min (ref >60)
Glucose, Bld: 108 mg/dL — ABNORMAL HIGH (ref 70–99)
Potassium: 3.7 mmol/L (ref 3.5–5.1)
Sodium: 139 mmol/L (ref 135–145)
Total Bilirubin: 0.6 mg/dL (ref 0.3–1.2)
Total Protein: 7.5 g/dL (ref 6.5–8.1)

## 2020-04-01 LAB — ACETAMINOPHEN LEVEL: Acetaminophen (Tylenol), Serum: <10 ug/mL — ABNORMAL LOW (ref 10–30)

## 2020-04-01 LAB — SARS CORONAVIRUS 2 BY RT PCR (HOSPITAL ORDER, PERFORMED IN ~~LOC~~ HOSPITAL LAB): SARS Coronavirus 2: NEGATIVE

## 2020-04-01 LAB — ETHANOL: Alcohol, Ethyl (B): <10 mg/dL (ref <10)

## 2020-04-01 LAB — SALICYLATE LEVEL: Salicylate Lvl: <7 mg/dL — ABNORMAL LOW (ref 7.0–30.0)

## 2020-04-01 MED ORDER — RISPERIDONE 1 MG PO TABS: 1.0000 mg | ORAL_TABLET | Freq: Every day | ORAL | Status: DC | PRN

## 2020-04-01 MED ORDER — RISPERIDONE 1 MG PO TABS
2.0000 mg | ORAL_TABLET | Freq: Two times a day (BID) | ORAL | Status: DC
  Administered 2020-04-01: 2 mg via ORAL
  Filled 2020-04-01: qty 2

## 2020-04-01 NOTE — ED Notes (Signed)
Hourly rounding reveals patient sleeping in room. No complaints, stable, in no acute distress. Q15 minute rounds and monitoring via Security Cameras to continue. 

## 2020-04-01 NOTE — ED Provider Notes (Signed)
[facility] Emergency Department Provider Note    First MD Initiated Contact with Patient 04/01/20 1542     (approximate)  I have reviewed the triage vital signs and the nursing notes.   HISTORY  Chief Complaint Psychiatric Evaluation    HPI Ernest Cook is a 29 y.o. male with a history of schizoaffective disorder presents to the ER under IVC by [employer] after reportedly walking back and forth in the street in front of his reportedly making threats of killing himself, speaking in [place] accent though he is from [place].  Currently denies any thoughts of harming himself.  States he does not remember anything occurred this morning.  States been compliant with his medications.  Past Medical History:  Diagnosis Date  . Schizo affective schizophrenia (HCC)    History reviewed. No pertinent family history. Past Surgical History:  Procedure Laterality Date  . SKIN GRAFT Left unknown   Pt reports left foot and leg skin graft   Patient Active Problem List   Diagnosis Date Noted  . Noncompliance 03/31/2016  . Cannabis use disorder, moderate, dependence (HCC) 03/26/2016  . Tobacco use disorder 03/26/2016  . Schizoaffective disorder, bipolar type (HCC) 03/25/2016  . Involuntary commitment 03/24/2016      Prior to Admission medications   Medication Sig Start Date End Date Taking? Authorizing Provider  atomoxetine (STRATTERA) 40 MG capsule Take 40 mg by mouth daily.    [provider]  benztropine (COGENTIN) 1 MG tablet Take 1 tablet (1 mg total) by mouth at bedtime. 03/31/16   Pucilowska, Jolanta B, MD  docusate sodium (COLACE) 100 MG capsule Take 100 mg by mouth daily as needed for mild constipation.    [provider]  lithium carbonate 300 MG capsule Take 3 capsules (900 mg total) by mouth at bedtime. 03/31/16   Pucilowska, Jolanta B, MD  risperiDONE (RISPERDAL) 1 MG tablet Take 1 mg by mouth daily as needed.     [provider]  risperiDONE (RISPERDAL) 2 MG tablet Take 1 tablet (2 mg total) by mouth 2 (two) times daily. 03/31/16   Pucilowska, Jolanta B, MD  risperiDONE microspheres (RISPERDAL CONSTA) 50 MG injection Inject 50 mg into the muscle every 14 (fourteen) days.    [provider]  temazepam (RESTORIL) 15 MG capsule Take 1 capsule (15 mg total) by mouth at bedtime. 03/31/16   Pucilowska, Ellin Goodie, MD    Allergies Patient has no known allergies.    Social History Social History   Tobacco Use  . Smoking status: Current Every Day Smoker    Packs/day: 0.25    Types: Cigarettes  . Smokeless tobacco: Never Used  Substance Use Topics  . Alcohol use: No  . Drug use: Yes    Types: Marijuana    Comment: "every now and again"    Review of Systems Patient denies headaches, rhinorrhea, blurry vision, numbness, shortness of breath, chest pain, edema, cough, abdominal pain, nausea, vomiting, diarrhea, dysuria, fevers, rashes or hallucinations unless otherwise stated above in HPI. ____________________________________________   PHYSICAL EXAM:  VITAL SIGNS: Vitals:   04/01/20 1540  BP: 131/84  Pulse: 79  Resp: 18  Temp: 98.2 F (36.8 C)  SpO2: 100%    Constitutional: Alert and oriented.  Eyes: Conjunctivae are normal.  Head: Atraumatic. Nose: No congestion/rhinnorhea. Mouth/Throat: Mucous membranes are moist.   Neck: No stridor. Painless ROM.  Cardiovascular: Normal rate, regular rhythm. Grossly normal heart sounds.  Good peripheral circulation. Respiratory: Normal  respiratory effort.  No retractions. Lungs CTAB. Gastrointestinal: Soft and nontender. No distention. No abdominal bruits. No CVA tenderness. Genitourinary:  Musculoskeletal: No lower extremity tenderness nor edema.  No joint effusions. Neurologic:  Normal speech and language. No gross focal neurologic deficits are appreciated. No facial droop Skin:  Skin is warm, dry and intact. No rash  noted. Psychiatric: odd affect, withdrawn  ____________________________________________   LABS (all labs ordered are listed, but only abnormal results are displayed)  No results found for this or any previous visit (from the past 24 hour(s)). ____________________________________________ ____________________________________________  RADIOLOGY   ____________________________________________   PROCEDURES  Procedure(s) performed:  Procedures    Critical Care performed: no ____________________________________________   INITIAL IMPRESSION / ASSESSMENT AND PLAN / ED COURSE  Pertinent labs & imaging results that were available during my care of the patient were reviewed by me and considered in my medical decision making (see chart for details).   DDX: Psychosis, delirium, medication effect, noncompliance, polysubstance abuse, Si, Hi, depression   Ernest Cook is a 29 y.o. who presents to the ED with for evaluation of psychosis.  Patient has psych history of schizophrenia.  Laboratory testing was ordered to evaluation for underlying electrolyte derangement or signs of underlying organic pathology to explain today's presentation.  Based on history and physical and laboratory evaluation, it appears that the patient's presentation is 2/2 underlying psychiatric disorder and will require further evaluation and management by inpatient psychiatry.  Patient was made an IVC due to SI.  Disposition pending psychiatric evaluation.  The patient has been placed in psychiatric observation due to the need to provide a safe environment for the patient while obtaining psychiatric consultation and evaluation, as well as ongoing medical and medication management to treat the patient's condition.  The patient has been placed under full IVC at this time.     The patient was evaluated in Emergency Department today for the symptoms described in the history of present illness. He/she was evaluated in the  context of the global COVID-19 pandemic, which necessitated consideration that the patient might be at risk for infection with the SARS-CoV-2 virus that causes COVID-19. Institutional protocols and algorithms that pertain to the evaluation of patients at risk for COVID-19 are in a state of rapid change based on information released by regulatory bodies including the CDC and federal and state organizations. These policies and algorithms were followed during the patient's care in the ED.  As part of my medical decision making, I reviewed the following data within the electronic MEDICAL RECORD NUMBER Nursing notes reviewed and incorporated, Labs reviewed, notes from prior ED visits and [place] Controlled Substance Database   ____________________________________________   FINAL CLINICAL IMPRESSION(S) / ED DIAGNOSES  Final diagnoses:  Acute psychosis (HCC)      NEW MEDICATIONS STARTED DURING THIS VISIT:  New Prescriptions   No medications on file     Note:  This document was prepared using Dragon voice recognition software and may include unintentional dictation errors.    Willy Eddy, MD 04/01/20 [phone]

## 2020-04-01 NOTE — ED Notes (Signed)
Snack tray and drink provided 

## 2020-04-01 NOTE — BH Assessment (Signed)
Assessment Note  Ernest Cook is an 29 y.o. male presenting to [facility] ED under IVC. Per triage note Pt comes with [facility] sheriff after being IVC for hallucinations. Pt states "i'm not hallucinating. I'm spitting truth". When asked what truth pt laughs to himself. Calm, cooperative at this time. During assessment patient is alert and oriented x3, patient is unaware of the situation on why he is here, pleasant and calm, speech is tangential at times. When asked if a patient understands why he was presenting to ED "I don't know, I heard some noises and the nice guy Thayer Ohm came to help me and a couple of other guys" "the other guys protected me, I see them everyday and they protect me and my son." Patient reported that he has a son that is 59 years old. Per the EDP note of Dr. Roxan Hockey when patient arrived to the ED he was speaking in a [place] accent and presented labile laughing when asked questions. Patient was able to report that he has a ACT team with [facility] and is taking his medications as prescribed. Patient denies SI but reported having SI "yesterday I threw myself into the walls and I was yelling." Patient denies HI/AH/VH.   Collateral information was obtained from patient's mother Rubin Dais) who reported "he has not been going to sleep, he's been hallucinating, talking to himself and crying."   Collateral information was obtained from patient's ACT Team [facility] team member Newton Pigg [phone]) who reported "he was doing really good, but a couple of weeks ago he got int a relationship with a new boyfriend, my coworker informed me that his mother reported things had changed and there was a possible falir up, he doesn't have a substance abuse issue but he might have smoked something." Marylene Land was able to report that the patient is taking his medications.  Per Psyc NP Elenore Paddy patient is recommended for Inpatient Hospitalization  Diagnosis: Schizoaffective Disorder  Past  Medical History:  Past Medical History:  Diagnosis Date  . Schizo affective schizophrenia [facility])     Past Surgical History:  Procedure Laterality Date  . SKIN GRAFT Left unknown   Pt reports left foot and leg skin graft    Family History: History reviewed. No pertinent family history.  Social History:  reports that he has been smoking cigarettes. He has been smoking about 0.25 packs per day. He has never used smokeless tobacco. He reports current drug use. Drug: Marijuana. He reports that he does not drink alcohol.  Additional Social History:  Alcohol / Drug Use Pain Medications: See MAR Prescriptions: See MAR Over the Counter: See MAR History of alcohol / drug use?: No history of alcohol / drug abuse  CIWA: CIWA-Ar BP: (!) 162/77 Pulse Rate: 88 COWS:    Allergies: No Known Allergies  Home Medications: (Not in a hospital admission)   OB/GYN Status:  No LMP for male patient.  General Assessment Data Location of Assessment: [facility] ED TTS Assessment: In system Is this a Tele or Face-to-Face Assessment?: Face-to-Face Is this an Initial Assessment or a Re-assessment for this encounter?: Initial Assessment Patient Accompanied by:: N/A Language Other than English: No Living Arrangements: Other (Comment) (Private Residence) What gender do you identify as?: Male Marital status: Single Pregnancy Status: No Living Arrangements: Alone Can pt return to current living arrangement?: Yes Admission Status: Involuntary Petitioner: Police Is patient capable of signing voluntary admission?: No Referral Source: Other Insurance type: Medicaid  Medical Screening Exam [facility] Walk-in ONLY)  Medical Exam completed: Yes  Crisis Care Plan Living Arrangements: Alone Legal Guardian: Other: (Self) Name of Psychiatrist: [facility] ACT Team Name of Therapist: Frederich Chick ACT team  Education Status Is patient currently in school?: No Is the patient employed, unemployed or receiving  disability?: Unemployed, Receiving disability income  Risk to self with the past 6 months Suicidal Ideation: No Has patient been a risk to self within the past 6 months prior to admission? : No Suicidal Intent: No Has patient had any suicidal intent within the past 6 months prior to admission? : No Is patient at risk for suicide?: No Suicidal Plan?: No Has patient had any suicidal plan within the past 6 months prior to admission? : No Access to Means: No What has been your use of drugs/alcohol within the last 12 months?: None Previous Attempts/Gestures: No How many times?: 0 Other Self Harm Risks: None Triggers for Past Attempts: None known Intentional Self Injurious Behavior: None Family Suicide History: No Recent stressful life event(s): Other (Comment) (None reported) Persecutory voices/beliefs?: No Depression: No Substance abuse history and/or treatment for substance abuse?: No Suicide prevention information given to non-admitted patients: Not applicable  Risk to Others within the past 6 months Homicidal Ideation: No Does patient have any lifetime risk of violence toward others beyond the six months prior to admission? : No Thoughts of Harm to Others: No Current Homicidal Intent: No Current Homicidal Plan: No Access to Homicidal Means: No Identified Victim: None History of harm to others?: No Assessment of Violence: None Noted Violent Behavior Description: None Does patient have access to weapons?: No Criminal Charges Pending?: No Does patient have a court date: No Is patient on probation?: No  Psychosis Hallucinations: Visual Delusions: None noted  Mental Status Report Appearance/Hygiene: In scrubs Eye Contact: Good Motor Activity: Freedom of movement Speech: Tangential Level of Consciousness: Alert Mood: Pleasant Affect: Appropriate to circumstance Anxiety Level: None Thought Processes: Tangential Judgement: Partial Orientation: Person, Place Obsessive  Compulsive Thoughts/Behaviors: None  Cognitive Functioning Concentration: Normal Memory: Recent Intact, Remote Intact Is patient IDD: No Insight: Fair Impulse Control: Good Appetite: Good Have you had any weight changes? : No Change Sleep: No Change Total Hours of Sleep: 8 Vegetative Symptoms: None  ADLScreening [facility] Assessment Services) Patient's cognitive ability adequate to safely complete daily activities?: Yes Patient able to express need for assistance with ADLs?: Yes Independently performs ADLs?: Yes (appropriate for developmental age)  Prior Inpatient Therapy Prior Inpatient Therapy: Yes Prior Therapy Dates: 2017 Prior Therapy Facilty/Provider(s): [facility] Reason for Treatment: Schizophrenia  Prior Outpatient Therapy Prior Outpatient Therapy: Yes Prior Therapy Dates: Current Prior Therapy Facilty/Provider(s): [facility] ACT team Reason for Treatment: Schizophrenia Does patient have an ACCT team?: Yes Odis Luster Seals) Does patient have Intensive In-House Services?  : No Does patient have [facility] services? : No Does patient have P4CC services?: No  ADL Screening (condition at time of admission) Patient's cognitive ability adequate to safely complete daily activities?: Yes Is the patient deaf or have difficulty hearing?: No Does the patient have difficulty seeing, even when wearing glasses/contacts?: No Does the patient have difficulty concentrating, remembering, or making decisions?: No Patient able to express need for assistance with ADLs?: Yes Does the patient have difficulty dressing or bathing?: No Independently performs ADLs?: Yes (appropriate for developmental age) Does the patient have difficulty walking or climbing stairs?: No Weakness of Legs: None Weakness of Arms/Hands: None  Home Assistive Devices/Equipment Home Assistive Devices/Equipment: None  Therapy Consults (therapy consults require a physician  order) PT Evaluation Needed: No OT Evalulation  Needed: No SLP Evaluation Needed: No Abuse/Neglect Assessment (Assessment to be complete while patient is alone) Abuse/Neglect Assessment Can Be Completed: Yes Physical Abuse: Denies Verbal Abuse: Denies Sexual Abuse: Denies Exploitation of patient/patient's resources: Denies Self-Neglect: Denies Values / Beliefs Cultural Requests During Hospitalization: None Spiritual Requests During Hospitalization: None Consults Spiritual Care Consult Needed: No Transition of Care Team Consult Needed: No Advance Directives (For Healthcare) Does Patient Have a Medical Advance Directive?: No          Disposition: Per Psyc NP Elenore Paddy patient is recommended for Inpatient Hospitalization Disposition Initial Assessment Completed for this Encounter: Yes  On Site Evaluation by:   Reviewed with Physician:    Benay Pike MS LCASA 04/01/2020 9:28 PM

## 2020-04-01 NOTE — BH Assessment (Signed)
Patient is to be admitted to [facility] by Psychiatric Nurse Practitioner Rishaun Dixon.  Attending Physician will be Dr. Toni Amend.   Patient has been assigned to room 302, by [facility] Charge Nurse Britta Mccreedy.     ER staff is aware of the admission:  [facility] ER Secretary    Dr. Roxan Hockey , ER MD   Jillyn Hidden Patient's Nurse   [facility] Patient Access.

## 2020-04-01 NOTE — ED Triage Notes (Signed)
Pt comes with [facility] sheriff after being IVC for hallucinations. Pt states "i'm not hallucinating. I'm spitting truth". When asked what truth pt laughs to himself. Calm, cooperative at this time.

## 2020-04-01 NOTE — ED Notes (Signed)
Pt. Transferred to [facility] from ED to room 1 after screening for contraband. Report to include Situation, Background, Assessment and Recommendations from Ann RN. Pt. Oriented to unit including Q15 minute rounds as well as the security cameras for their protection. Patient is alert and oriented, warm and dry in no acute distress. Patient denies SI, HI, and AVH. Pt. Encouraged to let me know if needs arise. 

## 2020-04-01 NOTE — ED Notes (Signed)
Pt given meal tray and juice. 

## 2020-04-02 ENCOUNTER — Encounter: Payer: Self-pay | Admitting: Psychiatry

## 2020-04-02 DIAGNOSIS — F259 Schizoaffective disorder, unspecified: Secondary | ICD-10-CM | POA: Diagnosis present

## 2020-04-02 DIAGNOSIS — F250 Schizoaffective disorder, bipolar type: Secondary | ICD-10-CM

## 2020-04-02 MED ORDER — HYDROXYZINE HCL 25 MG PO TABS
25.0000 mg | ORAL_TABLET | Freq: Three times a day (TID) | ORAL | Status: DC | PRN
  Administered 2020-04-02 – 2020-04-03 (×2): 25 mg via ORAL
  Filled 2020-04-02 (×2): qty 1

## 2020-04-02 MED ORDER — ALUM & MAG HYDROXIDE-SIMETH 200-200-20 MG/5ML PO SUSP: 30.0000 mL | ORAL | Status: DC | PRN

## 2020-04-02 MED ORDER — TRAZODONE HCL 50 MG PO TABS: 50.0000 mg | ORAL_TABLET | Freq: Every evening | ORAL | Status: DC | PRN

## 2020-04-02 MED ORDER — RISPERIDONE 1 MG PO TABS: 1.0000 mg | ORAL_TABLET | Freq: Every day | ORAL | Status: DC | PRN

## 2020-04-02 MED ORDER — RISPERIDONE 1 MG PO TABS
2.0000 mg | ORAL_TABLET | Freq: Two times a day (BID) | ORAL | Status: DC
  Administered 2020-04-02: 2 mg via ORAL
  Filled 2020-04-02: qty 2

## 2020-04-02 MED ORDER — PALIPERIDONE ER 3 MG PO TB24: 9.0000 mg | ORAL_TABLET | Freq: Every day | ORAL | Status: DC

## 2020-04-02 MED ORDER — ACETAMINOPHEN 325 MG PO TABS: 650.0000 mg | ORAL_TABLET | Freq: Four times a day (QID) | ORAL | Status: DC | PRN

## 2020-04-02 MED ORDER — RISPERIDONE 1 MG PO TABS
3.0000 mg | ORAL_TABLET | Freq: Every day | ORAL | Status: DC
  Administered 2020-04-02 – 2020-04-03 (×2): 3 mg via ORAL
  Filled 2020-04-02 (×3): qty 3

## 2020-04-02 MED ORDER — BENZTROPINE MESYLATE 1 MG PO TABS
0.5000 mg | ORAL_TABLET | Freq: Two times a day (BID) | ORAL | Status: DC
  Administered 2020-04-02 – 2020-04-04 (×5): 0.5 mg via ORAL
  Filled 2020-04-02 (×5): qty 1

## 2020-04-02 MED ORDER — MAGNESIUM HYDROXIDE 400 MG/5ML PO SUSP: 30.0000 mL | Freq: Every day | ORAL | Status: DC | PRN

## 2020-04-02 MED ORDER — LITHIUM CARBONATE ER 450 MG PO TBCR
450.0000 mg | EXTENDED_RELEASE_TABLET | Freq: Two times a day (BID) | ORAL | Status: DC
  Administered 2020-04-02 – 2020-04-04 (×5): 450 mg via ORAL
  Filled 2020-04-02 (×5): qty 1

## 2020-04-02 MED ORDER — LORAZEPAM 2 MG/ML IJ SOLN: 2.0000 mg | INTRAMUSCULAR | Status: DC | PRN

## 2020-04-02 MED ORDER — LORAZEPAM 2 MG PO TABS
2.0000 mg | ORAL_TABLET | ORAL | Status: DC | PRN
  Administered 2020-04-02 – 2020-04-03 (×3): 2 mg via ORAL
  Filled 2020-04-02 (×3): qty 1

## 2020-04-02 NOTE — Plan of Care (Signed)
  Problem: Education: Goal: Knowledge of [facility] Education information/materials will improve Outcome: Progressing Goal: Emotional status will improve Outcome: Progressing Goal: Mental status will improve Outcome: Progressing Goal: Verbalization of understanding the information provided will improve Outcome: Progressing   Problem: Activity: Goal: Interest or engagement in activities will improve Outcome: Progressing Goal: Sleeping patterns will improve Outcome: Progressing   

## 2020-04-02 NOTE — Progress Notes (Signed)
Patient has been walking around the unit having loud outbursts and throwing a stress ball, that he received in recreational therapy group, up against the windows and other patient doors. Patient had been asked several times from other staff members to stop doing this. Patient would tell staff members ok and keep doing it. This Clinical research associate went out and asked patient for the stress ball, in which he did hand it over, and he also took PRN medication for agitation without any issues. Patient's room was also moved to the back hall, in which he complied with this change as well. Patient will continue to be monitored.

## 2020-04-02 NOTE — H&P (Signed)
Psychiatric Admission Assessment Adult  Patient Identification: Ernest Cook MRN:  [id number] Date of Evaluation:  04/02/2020 Chief Complaint:  Schizoaffective disorder (HCC) [F25.9] Principal Diagnosis: Schizoaffective disorder (HCC) Diagnosis:  Principal Problem:   Schizoaffective disorder (HCC)  History of Present Illness: 29 year old man with a history of schizoaffective disorder brought to the emergency room after recent decompensation.  He had become agitated and hostile and threatening marching up and down the street talking crazy.  According to collateral information from his act team this seems to have been an acute decompensation.  Possibly had recently had some life stresses but by everything they knew had been compliant with medication.  In interview today the patient is unwilling or unable to give me much information.  At first he sat down with me but then he told me the reason he was here is that I was destroying his life.  After that he stormed out of the room and would not talk anymore. Associated Signs/Symptoms: Depression Symptoms:  psychomotor agitation, difficulty concentrating, (Hypo) Manic Symptoms:  Impulsivity, Irritable Mood, Labiality of Mood, Anxiety Symptoms:  Excessive Worry, Psychotic Symptoms:  Paranoia, PTSD Symptoms: Negative Total Time spent with patient: 1 hour  Past Psychiatric History: Patient has a known history of schizoaffective disorder.  Last known hospitalization was 2017.  He appears to still be on Risperdal and lithium and is followed by his act team with [employer].  He has no known previous suicide attempts  Is the patient at risk to self? Yes.    Has the patient been a risk to self in the past 6 months? No.  Has the patient been a risk to self within the distant past? Yes.    Is the patient a risk to others? No.  Has the patient been a risk to others in the past 6 months? No.  Has the patient been a risk to others within the distant  past? No.   Prior Inpatient Therapy:   Prior Outpatient Therapy:    Alcohol Screening: 1. How often do you have a drink containing alcohol?: 2 to 4 times a month 2. How many drinks containing alcohol do you have on a typical day when you are drinking?: 5 or 6 3. How often do you have six or more drinks on one occasion?: Monthly AUDIT-C Score: 6 4. How often during the last year have you found that you were not able to stop drinking once you had started?: Never 5. How often during the last year have you failed to do what was normally expected from you because of drinking?: Never 6. How often during the last year have you needed a first drink in the morning to get yourself going after a heavy drinking session?: Never 7. How often during the last year have you had a feeling of guilt of remorse after drinking?: Never 8. How often during the last year have you been unable to remember what happened the night before because you had been drinking?: Never 9. Have you or someone else been injured as a result of your drinking?: No 10. Has a relative or friend or a doctor or another health worker been concerned about your drinking or suggested you cut down?: No Alcohol Use Disorder Identification Test Final Score (AUDIT): 6 Alcohol Brief Interventions/Follow-up: Brief Advice Substance Abuse History in the last 12 months:  No. Consequences of Substance Abuse: He is unable to answer questions but as far as everyone knows and has reported there is no substance abuse  issue Previous Psychotropic Medications: Yes  Psychological Evaluations: Yes  Past Medical History:  Past Medical History:  Diagnosis Date  . Schizo affective schizophrenia [facility])     Past Surgical History:  Procedure Laterality Date  . SKIN GRAFT Left unknown   Pt reports left foot and leg skin graft   Family History: History reviewed. No pertinent family history. Family Psychiatric  History: No information available Tobacco  Screening: Have you used any form of tobacco in the last 30 days? (Cigarettes, Smokeless Tobacco, Cigars, and/or Pipes): No Social History:  Social History   Substance and Sexual Activity  Alcohol Use No     Social History   Substance and Sexual Activity  Drug Use Yes  . Types: Marijuana   Comment: "every now and again"    Additional Social History:                           Allergies:  No Known Allergies Lab Results:  Results for orders placed or performed during the hospital encounter of 04/01/20 (from the past 48 hour(s))  Comprehensive metabolic panel     Status: Abnormal   Collection Time: 04/01/20  4:02 PM  Result Value Ref Range   Sodium 139 135 - 145 mmol/L   Potassium 3.7 3.5 - 5.1 mmol/L   Chloride 104 98 - 111 mmol/L   CO2 26 22 - 32 mmol/L   Glucose, Bld 108 (H) 70 - 99 mg/dL    Comment: Glucose reference range applies only to samples taken after fasting for at least 8 hours.   BUN 11 6 - 20 mg/dL   Creatinine, Ser 1.61 0.61 - 1.24 mg/dL   Calcium 9.3 8.9 - 09.6 mg/dL   Total Protein 7.5 6.5 - 8.1 g/dL   Albumin 4.7 3.5 - 5.0 g/dL   AST 26 15 - 41 U/L   ALT 19 0 - 44 U/L   Alkaline Phosphatase 57 38 - 126 U/L   Total Bilirubin 0.6 0.3 - 1.2 mg/dL   GFR calc non Af Amer >60 >60 mL/min   GFR calc Af Amer >60 >60 mL/min   Anion gap 9 5 - 15    Comment: Performed at [facility], [address], [place], [place] [address]  Ethanol     Status: None   Collection Time: 04/01/20  4:02 PM  Result Value Ref Range   Alcohol, Ethyl (B) <10 <10 mg/dL    Comment: (NOTE) Lowest detectable limit for serum alcohol is 10 mg/dL.  For medical purposes only. Performed at [facility], [address] [place], [place] [address]   Salicylate level     Status: Abnormal   Collection Time: 04/01/20  4:02 PM  Result Value Ref Range   Salicylate Lvl <7.0 (L) 7.0 - 30.0 mg/dL    Comment: Performed at [facility], [address]  [place], [place] [address]  Acetaminophen level     Status: Abnormal   Collection Time: 04/01/20  4:02 PM  Result Value Ref Range   Acetaminophen (Tylenol), Serum <10 (L) 10 - 30 ug/mL    Comment: (NOTE) Therapeutic concentrations vary significantly. A range of 10-30 ug/mL  may be an effective concentration for many patients. However, some  are best treated at concentrations outside of this range. Acetaminophen concentrations >150 ug/mL at 4 hours after ingestion  and >50 ug/mL at 12 hours after ingestion are often associated with  toxic reactions.  Performed at  [facility], [address] [place], [place] [address]   cbc     Status: Abnormal   Collection Time: 04/01/20  4:02 PM  Result Value Ref Range   WBC 13.8 (H) 4.0 - 10.5 K/uL   RBC 5.09 4.22 - 5.81 MIL/uL   Hemoglobin 13.8 13.0 - 17.0 g/dL   HCT 32.2 39 - 52 %   MCV 82.1 80.0 - 100.0 fL   MCH 27.1 26.0 - 34.0 pg   MCHC 33.0 30.0 - 36.0 g/dL   RDW 02.5 42.7 - 06.2 %   Platelets 341 150 - 400 K/uL   nRBC 0.0 0.0 - 0.2 %    Comment: Performed at [facility], [address], [place], [place] [address]  Lithium level     Status: Abnormal   Collection Time: 04/01/20  4:02 PM  Result Value Ref Range   Lithium Lvl 0.24 (L) 0.60 - 1.20 mmol/L    Comment: Performed at [facility], [address] [place], [place] [address]  SARS Coronavirus 2 by RT PCR (hospital order, performed in [facility] hospital lab) Nasopharyngeal Nasopharyngeal Swab     Status: None   Collection Time: 04/01/20  6:30 PM   Specimen: Nasopharyngeal Swab  Result Value Ref Range   SARS Coronavirus 2 NEGATIVE NEGATIVE    Comment: (NOTE) SARS-CoV-2 target nucleic acids are NOT DETECTED.  The SARS-CoV-2 RNA is generally detectable in upper and lower respiratory specimens during the acute phase of infection. The lowest concentration of SARS-CoV-2 viral copies this assay can detect is 250 copies / mL. A negative result does not  preclude SARS-CoV-2 infection and should not be used as the sole basis for treatment or other patient management decisions.  A negative result may occur with improper specimen collection / handling, submission of specimen other than nasopharyngeal swab, presence of viral mutation(s) within the areas targeted by this assay, and inadequate number of viral copies (<250 copies / mL). A negative result must be combined with clinical observations, patient history, and epidemiological information.  Fact Sheet for Patients:   BoilerBrush.com.cy  Fact Sheet for Healthcare Providers: https://pope.com/  This test is not yet approved or  cleared by the [place] FDA and has been authorized for detection and/or diagnosis of SARS-CoV-2 by FDA under an Emergency Use Authorization (EUA).  This EUA will remain in effect (meaning this test can be used) for the duration of the COVID-19 declaration under Section 564(b)(1) of the Act, 21 U.S.C. section 360bbb-3(b)(1), unless the authorization is terminated or revoked sooner.  Performed at [facility], [address] [place], [place] [address]     Blood Alcohol level:  Lab Results  Component Value Date   [facility] <10 04/01/2020   ETH <5 03/24/2016    Metabolic Disorder Labs:  Lab Results  Component Value Date   HGBA1C 6.0 03/26/2016   Lab Results  Component Value Date   PROLACTIN 48.0 (H) 03/26/2016   Lab Results  Component Value Date   CHOL 158 03/26/2016   TRIG 134 03/26/2016   HDL 29 (L) 03/26/2016   CHOLHDL 5.4 03/26/2016   VLDL 27 03/26/2016   LDLCALC 102 (H) 03/26/2016    Current Medications: Current Facility-Administered Medications  Medication Dose Route Frequency Provider Last Rate Last Admin  . acetaminophen (TYLENOL) tablet 650 mg  650 mg Oral Q6H PRN Dixon, Rashaun M, NP      . alum & mag hydroxide-simeth (MAALOX/MYLANTA) 200-200-20 MG/5ML suspension 30 mL  30  mL Oral Q4H PRN Durwin Nora, Rashaun M, NP      . benztropine (COGENTIN) tablet 0.5 mg  0.5 mg Oral BID Ruford Dudzinski T, MD   0.5 mg at 04/02/20 6295  . hydrOXYzine (ATARAX/VISTARIL) tablet 25 mg  25 mg Oral TID PRN Jearld Lesch, NP      . lithium carbonate (ESKALITH) CR tablet 450 mg  450 mg Oral Q12H Vayda Dungee, Jackquline Denmark, MD   450 mg at 04/02/20 0917  . LORazepam (ATIVAN) tablet 2 mg  2 mg Oral Q4H PRN Kushi Kun, Jackquline Denmark, MD   2 mg at 04/02/20 1022   Or  . LORazepam (ATIVAN) injection 2 mg  2 mg Intramuscular Q4H PRN Denesha Brouse T, MD      . magnesium hydroxide (MILK OF MAGNESIA) suspension 30 mL  30 mL Oral Daily PRN Dixon, Rashaun M, NP      . paliperidone (INVEGA) 24 hr tablet 9 mg  9 mg Oral QHS Dru Laurel T, MD       PTA Medications: Medications Prior to Admission  Medication Sig Dispense Refill Last Dose  . atomoxetine (STRATTERA) 40 MG capsule Take 40 mg by mouth daily.     . benztropine (COGENTIN) 1 MG tablet Take 1 tablet (1 mg total) by mouth at bedtime. 30 tablet 0   . docusate sodium (COLACE) 100 MG capsule Take 100 mg by mouth daily as needed for mild constipation.     Marland Kitchen lithium carbonate 300 MG capsule Take 3 capsules (900 mg total) by mouth at bedtime. 90 capsule 0   . risperiDONE (RISPERDAL) 3 MG tablet Take 3 mg by mouth at bedtime.     . risperiDONE microspheres (RISPERDAL CONSTA) 50 MG injection Inject 50 mg into the muscle every 14 (fourteen) days.     . temazepam (RESTORIL) 15 MG capsule Take 1 capsule (15 mg total) by mouth at bedtime. 30 capsule 0     Musculoskeletal: Strength & Muscle Tone: within normal limits Gait & Station: normal Patient leans: N/A  Psychiatric Specialty Exam: Physical Exam Vitals and nursing note reviewed.  Constitutional:      Appearance: He is well-developed.  HENT:     Head: Normocephalic and atraumatic.  Eyes:     Conjunctiva/sclera: Conjunctivae normal.     Pupils: Pupils are equal, round, and reactive to light.  Cardiovascular:      Heart sounds: Normal heart sounds.  Pulmonary:     Effort: Pulmonary effort is normal.  Abdominal:     Palpations: Abdomen is soft.  Musculoskeletal:        General: Normal range of motion.     Cervical back: Normal range of motion.  Skin:    General: Skin is warm and dry.  Neurological:     General: No focal deficit present.     Mental Status: He is alert.  Psychiatric:        Attention and Perception: He is inattentive.        Mood and Affect: Affect is labile and inappropriate.        Speech: He is noncommunicative.        Behavior: Behavior is agitated. Behavior is not aggressive.        Thought Content: Thought content is paranoid. Thought content does not include homicidal or suicidal ideation.        Cognition and Memory: Cognition is impaired. Memory is impaired.        Judgment: Judgment is impulsive.     Review  of Systems  Constitutional: Negative.   HENT: Negative.   Eyes: Negative.   Respiratory: Negative.   Cardiovascular: Negative.   Gastrointestinal: Negative.   Musculoskeletal: Negative.   Skin: Negative.   Neurological: Negative.   Psychiatric/Behavioral: Positive for behavioral problems and dysphoric mood. The patient is nervous/anxious.     Blood pressure 127/78, pulse 72, temperature 98.1 F (36.7 C), temperature source Oral, resp. rate 18, height 5\' 9"  (1.753 m), weight 95.3 kg, SpO2 100 %.Body mass index is 31.01 kg/m.  General Appearance: Casual  Eye Contact:  Good  Speech:  Blocked and Pressured  Volume:  Decreased  Mood:  Angry  Affect:  Inappropriate  Thought Process:  Disorganized  Orientation:  Negative  Thought Content:  Illogical, Delusions and Paranoid Ideation  Suicidal Thoughts:  No  Homicidal Thoughts:  No  Memory:  Immediate;   Fair Recent;   Fair Remote;   Fair  Judgement:  Impaired  Insight:  Lacking  Psychomotor Activity:  Restlessness  Concentration:  Concentration: Poor  Recall:  Poor  Fund of Knowledge:  Poor   Language:  Fair  Akathisia:  No  Handed:  Right  AIMS (if indicated):     Assets:  Desire for Improvement  ADL's:  Impaired  Cognition:  Impaired,  Mild  Sleep:  Number of Hours: 3.15    Treatment Plan Summary: Daily contact with patient to assess and evaluate symptoms and progress in treatment, Medication management and Plan After reading the chart it appears that he has been compliant with medication.  We will continue the Risperdal and lithium as previously ordered.  Continue 15-minute checks.  As needed medicine available as needed.  We will try to reach [facility] and [facility].  Continue to try to daily involve him in individual and group therapy and assessment.  Observation Level/Precautions:  15 minute checks  Laboratory:  Chemistry Profile  Psychotherapy:    Medications:    Consultations:    Discharge Concerns:    Estimated LOS:  Other:     Physician Treatment Plan for Primary Diagnosis: Schizoaffective disorder (HCC) Long Term Goal(s): Improvement in symptoms so as ready for discharge  Short Term Goals: Ability to verbalize feelings will improve and Ability to demonstrate self-control will improve  Physician Treatment Plan for Secondary Diagnosis: Principal Problem:   Schizoaffective disorder (HCC)  Long Term Goal(s): Improvement in symptoms so as ready for discharge  Short Term Goals: Compliance with prescribed medications will improve  I certify that inpatient services furnished can reasonably be expected to improve the patient's condition.    Ernest RasmussenJohn Malicia Blasdel, MD 8/16/20214:31 PM

## 2020-04-02 NOTE — [facility] Suicide Risk Assessment (Signed)
[facility] Admission Suicide Risk Assessment   Nursing information obtained from:  Patient Demographic factors:  Male, Ernest Cook, lesbian, or bisexual orientation Current Mental Status:  NA Loss Factors:  NA Historical Factors:  NA Risk Reduction Factors:  Positive social support  Total Time spent with patient: 1 hour Principal Problem: Schizoaffective disorder (HCC) Diagnosis:  Principal Problem:   Schizoaffective disorder (HCC)  Subjective Data: Patient seen chart reviewed.  29 year old man with a history of schizoaffective disorder brought to the emergency room because of agitated bizarre psychotic behavior.  No evidence of self injury.  Not reporting any suicidal ideation now.  Remains disorganized agitated and psychotic but has not been physically violent.  So far cooperative.  Continued Clinical Symptoms:  Alcohol Use Disorder Identification Test Final Score (AUDIT): 6 The "Alcohol Use Disorders Identification Test", Guidelines for Use in Primary Care, Second Edition.  World Science writer [facility]). Score between 0-7:  no or low risk or alcohol related problems. Score between 8-15:  moderate risk of alcohol related problems. Score between 16-19:  high risk of alcohol related problems. Score 20 or above:  warrants further diagnostic evaluation for alcohol dependence and treatment.   CLINICAL FACTORS:   Schizophrenia:   Less than 78 years old   Musculoskeletal: Strength & Muscle Tone: within normal limits Gait & Station: normal Patient leans: N/A  Psychiatric Specialty Exam: Physical Exam Vitals and nursing note reviewed.  Constitutional:      Appearance: He is well-developed.  HENT:     Head: Normocephalic and atraumatic.  Eyes:     Conjunctiva/sclera: Conjunctivae normal.     Pupils: Pupils are equal, round, and reactive to light.  Cardiovascular:     Heart sounds: Normal heart sounds.  Pulmonary:     Effort: Pulmonary effort is normal.  Abdominal:     Palpations: Abdomen  is soft.  Musculoskeletal:        General: Normal range of motion.     Cervical back: Normal range of motion.  Skin:    General: Skin is warm and dry.  Neurological:     General: No focal deficit present.     Mental Status: He is alert.  Psychiatric:        Attention and Perception: He is inattentive.        Mood and Affect: Affect is angry and inappropriate.        Speech: He is noncommunicative.        Behavior: Behavior is uncooperative.        Thought Content: Thought content is paranoid.        Cognition and Memory: Cognition is impaired.        Judgment: Judgment is inappropriate.     Review of Systems  Constitutional: Negative.   HENT: Negative.   Eyes: Negative.   Respiratory: Negative.   Cardiovascular: Negative.   Gastrointestinal: Negative.   Musculoskeletal: Negative.   Skin: Negative.   Neurological: Negative.   Psychiatric/Behavioral: Positive for dysphoric mood.    Blood pressure 127/78, pulse 72, temperature 98.1 F (36.7 C), temperature source Oral, resp. rate 18, height 5\' 9"  (1.753 m), weight 95.3 kg, SpO2 100 %.Body mass index is 31.01 kg/m.  General Appearance: Casual  Eye Contact:  Minimal  Speech:  Garbled  Volume:  Decreased  Mood:  Dysphoric and Irritable  Affect:  Congruent  Thought Process:  Disorganized  Orientation:  Full (Time, Place, and Person)  Thought Content:  Illogical, Delusions and Paranoid Ideation  Suicidal Thoughts:  No  Homicidal Thoughts:  No  Memory:  Immediate;   Fair Recent;   Poor Remote;   Poor  Judgement:  Impaired  Insight:  Lacking  Psychomotor Activity:  Decreased  Concentration:  Concentration: Poor  Recall:  Poor  Fund of Knowledge:  Poor  Language:  Poor  Akathisia:  No  Handed:  Right  AIMS (if indicated):     Assets:  Desire for Improvement Housing  ADL's:  Impaired  Cognition:  Impaired,  Mild  Sleep:  Number of Hours: 3.15      COGNITIVE FEATURES THAT CONTRIBUTE TO RISK:  Polarized thinking     SUICIDE RISK:   Minimal: No identifiable suicidal ideation.  Patients presenting with no risk factors but with morbid ruminations; may be classified as minimal risk based on the severity of the depressive symptoms  PLAN OF CARE: Continue 15-minute checks.  Continue medication.  Engage in individual and group therapy and reassess daily for improvement in mental state.  I certify that inpatient services furnished can reasonably be expected to improve the patient's condition.   Mordecai Rasmussen, MD 04/02/2020, 12:50 PM

## 2020-04-02 NOTE — Plan of Care (Signed)
D- Patient alert and oriented. Patient presented in a labile mood on assessment stating that he is ready to go home. Patient stated that he slept alright and had no complaints to voice to this Clinical research associate. Patient denied any signs/symptoms of depression/anxiety, reporting that he feels "ok, good". Patient also denied SI, HI, AVH, and pain at this time. Patient had no stated goals for today.  A- Scheduled medications administered to patient, per MD orders. Support and encouragement provided.  Routine safety checks conducted every 15 minutes.  Patient informed to notify staff with problems or concerns.  R- No adverse drug reactions noted. Patient contracts for safety at this time. Patient compliant with medications and treatment plan. Patient receptive, calm, and cooperative. Patient remains safe at this time.  Problem: Education: Goal: Knowledge of [facility] Education information/materials will improve Outcome: Not Progressing Goal: Emotional status will improve Outcome: Not Progressing Goal: Mental status will improve Outcome: Not Progressing Goal: Verbalization of understanding the information provided will improve Outcome: Not Progressing   Problem: Activity: Goal: Interest or engagement in activities will improve Outcome: Not Progressing Goal: Sleeping patterns will improve Outcome: Not Progressing   Problem: Coping: Goal: Ability to verbalize frustrations and anger appropriately will improve Outcome: Not Progressing Goal: Ability to demonstrate self-control will improve Outcome: Not Progressing   Problem: Health Behavior/Discharge Planning: Goal: Identification of resources available to assist in meeting health care needs will improve Outcome: Not Progressing Goal: Compliance with treatment plan for underlying cause of condition will improve Outcome: Not Progressing   Problem: Physical Regulation: Goal: Ability to maintain clinical measurements within normal limits will  improve Outcome: Not Progressing   Problem: Safety: Goal: Periods of time without injury will increase Outcome: Not Progressing   Problem: Activity: Goal: Will verbalize the importance of balancing activity with adequate rest periods Outcome: Not Progressing   Problem: Education: Goal: Will be free of psychotic symptoms Outcome: Not Progressing Goal: Knowledge of the prescribed therapeutic regimen will improve Outcome: Not Progressing   Problem: Coping: Goal: Coping ability will improve Outcome: Not Progressing Goal: Will verbalize feelings Outcome: Not Progressing   Problem: Health Behavior/Discharge Planning: Goal: Compliance with prescribed medication regimen will improve Outcome: Not Progressing   Problem: Nutritional: Goal: Ability to achieve adequate nutritional intake will improve Outcome: Not Progressing   Problem: Role Relationship: Goal: Ability to communicate needs accurately will improve Outcome: Not Progressing Goal: Ability to interact with others will improve Outcome: Not Progressing   Problem: Safety: Goal: Ability to redirect hostility and anger into socially appropriate behaviors will improve Outcome: Not Progressing Goal: Ability to remain free from injury will improve Outcome: Not Progressing   Problem: Self-Care: Goal: Ability to participate in self-care as condition permits will improve Outcome: Not Progressing   Problem: Self-Concept: Goal: Will verbalize positive feelings about self Outcome: Not Progressing

## 2020-04-02 NOTE — Tx Team (Signed)
Initial Treatment Plan 04/02/2020 4:13 AM Ernest Cook ZRA:[id number]    PATIENT STRESSORS: Marital or family conflict   PATIENT STRENGTHS: Capable of independent living General fund of knowledge Supportive family/friends   PATIENT IDENTIFIED PROBLEMS:       psychosis               DISCHARGE CRITERIA:  Ability to meet basic life and health needs Adequate post-discharge living arrangements Improved stabilization in mood, thinking, and/or behavior Medical problems require only outpatient monitoring Motivation to continue treatment in a less acute level of care Need for constant or close observation no longer present Reduction of life-threatening or endangering symptoms to within safe limits Safe-care adequate arrangements made Verbal commitment to aftercare and medication compliance  PRELIMINARY DISCHARGE PLAN: Outpatient therapy Return to previous living arrangement  PATIENT/FAMILY INVOLVEMENT: This treatment plan has been presented to and reviewed with the patient, Ernest Cook, and/or family member.  The patient and family have been given the opportunity to ask questions and make suggestions.  Billy Coast, RN 04/02/2020, 4:13 AM

## 2020-04-02 NOTE — Progress Notes (Signed)
Patient was admitted for throwing himself up against a wall, acting bizarre and speaking in a [place] accent although he is not from [place]. Upon presentation, he is pleasant but actively hallucinating and responding to internal stimuli, with nonsensical speech, and when asked who he is talking to he says "no one". Denies SI and HI. Skin search done with Cleo, LPH reveals a scar on his right forearm that he indicated came from some sort of occupational injury and when asked what kind, he mimed playing basketball. He is mostly cooperative, but refuses to sign any paperwork and forwards very little information that makes sense in conversation.

## 2020-04-02 NOTE — Progress Notes (Signed)
Recreation Therapy Notes   Date: 04/02/2020  Time: 9:30 am  Location: Craft room   Behavioral response: Appropriate  Intervention Topic: Stress Management   Discussion/Intervention:  Group content on today was focused on stress. The group defined stress and way to cope with stress. Participants expressed how they know when they are stresses out. Individuals described the different ways they have to cope with stress. The group stated reasons why it is important to cope with stress. Patient explained what good stress is and some examples. The group participated in the intervention "Stress Management Jeopardy". Individuals were separated into two group and answered questions related to stress.  Clinical Observations/Feedback:  Patient came to group and explained that his family is a stressor for him. He stated that he manages his stress by ignoring people that try to do him wrong.  Individual was social with peers and staff while participating in the intervention. Sheelah Ritacco LRT/CTRS         Beckett Hickmon 04/02/2020 11:41 AM

## 2020-04-03 DIAGNOSIS — F250 Schizoaffective disorder, bipolar type: Secondary | ICD-10-CM | POA: Diagnosis not present

## 2020-04-03 NOTE — Progress Notes (Signed)
Patient alert and oriented x 3 with periods of confusion to time and situations he appears responding to internal stimuli he denies SI/HI/AVH but noted talking to himself, pacing te unit attempting to open exit doors and not interacting appropriately with peers and staff. Patient was offered emotional support and encouraged to attend evening wrap up group. Patient didn't attend group he was complaint with medication regimen, 15 minutes safety checks maintained will continue to monitor.

## 2020-04-03 NOTE — Progress Notes (Signed)
D- Patient alert and oriented. Affect/mood is calm, cooperative. Pt denies SI, HI, AVH, and pain.   A- Scheduled medications administered to patient, per MD orders. Support and encouragement provided.  Routine safety checks conducted every 15 minutes.  Patient informed to notify staff with problems or concerns.  R- No adverse drug reactions noted. Patient contracts for safety at this time. Patient compliant with medications and treatment plan. Patient receptive, calm, and cooperative. Patient interacts well with others on the unit.  Patient remains safe at this time.  Torrie Mayers RN

## 2020-04-03 NOTE — Progress Notes (Signed)
°   04/03/20 1400  °Clinical Encounter Type  °Visited With Patient  °Visit Type Initial;Spiritual support;Social support;Behavioral Health  °Referral From Chaplain  °Consult/Referral To Chaplain  °Pt attended Ch group today. The group focus was on anxiety. Pt participated in group. Ch will follow-up with Pt. °

## 2020-04-03 NOTE — Tx Team (Addendum)
Interdisciplinary Treatment and Diagnostic Plan Update  04/03/2020 Time of Session: 9:00AM Ernest Cook MRN: [id number]  Principal Diagnosis: Schizoaffective disorder [facility])  Secondary Diagnoses: Principal Problem:   Schizoaffective disorder ([place])   Current Medications:  Current Facility-Administered Medications  Medication Dose Route Frequency Provider Last Rate Last Admin  . acetaminophen (TYLENOL) tablet 650 mg  650 mg Oral Q6H PRN Dixon, Rashaun M, NP      . alum & mag hydroxide-simeth (MAALOX/MYLANTA) 200-200-20 MG/5ML suspension 30 mL  30 mL Oral Q4H PRN Dixon, Rashaun M, NP      . benztropine (COGENTIN) tablet 0.5 mg  0.5 mg Oral BID Clapacs, Madie Reno, MD   0.5 mg at 04/03/20 0754  . hydrOXYzine (ATARAX/VISTARIL) tablet 25 mg  25 mg Oral TID PRN Deloria Lair, NP   25 mg at 04/02/20 1649  . lithium carbonate (ESKALITH) CR tablet 450 mg  450 mg Oral Q12H Clapacs, Madie Reno, MD   450 mg at 04/03/20 0754  . LORazepam (ATIVAN) tablet 2 mg  2 mg Oral Q4H PRN Clapacs, Madie Reno, MD   2 mg at 04/03/20 1253   Or  . LORazepam (ATIVAN) injection 2 mg  2 mg Intramuscular Q4H PRN Clapacs, John T, MD      . magnesium hydroxide (MILK OF MAGNESIA) suspension 30 mL  30 mL Oral Daily PRN Dixon, Rashaun M, NP      . risperiDONE (RISPERDAL) tablet 3 mg  3 mg Oral QHS Clapacs, John T, MD   3 mg at 04/02/20 2107   PTA Medications: Medications Prior to Admission  Medication Sig Dispense Refill Last Dose  . atomoxetine (STRATTERA) 40 MG capsule Take 40 mg by mouth daily.     . benztropine (COGENTIN) 1 MG tablet Take 1 tablet (1 mg total) by mouth at bedtime. 30 tablet 0   . docusate sodium (COLACE) 100 MG capsule Take 100 mg by mouth daily as needed for mild constipation.     Marland Kitchen lithium carbonate 300 MG capsule Take 3 capsules (900 mg total) by mouth at bedtime. 90 capsule 0   . risperiDONE (RISPERDAL) 3 MG tablet Take 3 mg by mouth at bedtime.     . risperiDONE microspheres (RISPERDAL CONSTA) 50 MG  injection Inject 50 mg into the muscle every 14 (fourteen) days.     . temazepam (RESTORIL) 15 MG capsule Take 1 capsule (15 mg total) by mouth at bedtime. 30 capsule 0     Patient Stressors: Marital or family conflict  Patient Strengths: Capable of independent living General fund of knowledge Supportive family/friends  Treatment Modalities: Medication Management, Group therapy, Case management,  1 to 1 session with clinician, Psychoeducation, Recreational therapy.   Physician Treatment Plan for Primary Diagnosis: Schizoaffective disorder ([place]) Long Term Goal(s): Improvement in symptoms so as ready for discharge Improvement in symptoms so as ready for discharge   Short Term Goals: Ability to verbalize feelings will improve Ability to demonstrate self-control will improve Compliance with prescribed medications will improve  Medication Management: Evaluate patient's response, side effects, and tolerance of medication regimen.  Therapeutic Interventions: 1 to 1 sessions, Unit Group sessions and Medication administration.  Evaluation of Outcomes: Not Met  Physician Treatment Plan for Secondary Diagnosis: Principal Problem:   Schizoaffective disorder ([place])  Long Term Goal(s): Improvement in symptoms so as ready for discharge Improvement in symptoms so as ready for discharge   Short Term Goals: Ability to verbalize feelings will improve Ability to demonstrate self-control will improve Compliance with  prescribed medications will improve     Medication Management: Evaluate patient's response, side effects, and tolerance of medication regimen.  Therapeutic Interventions: 1 to 1 sessions, Unit Group sessions and Medication administration.  Evaluation of Outcomes: Not Met   RN Treatment Plan for Primary Diagnosis: Schizoaffective disorder ([place]) Long Term Goal(s): Knowledge of disease and therapeutic regimen to maintain health will improve  Short Term Goals: Ability to verbalize  frustration and anger appropriately will improve, Ability to demonstrate self-control, Ability to participate in decision making will improve, Ability to verbalize feelings will improve, Ability to identify and develop effective coping behaviors will improve and Compliance with prescribed medications will improve  Medication Management: RN will administer medications as ordered by provider, will assess and evaluate patient's response and provide education to patient for prescribed medication. RN will report any adverse and/or side effects to prescribing provider.  Therapeutic Interventions: 1 on 1 counseling sessions, Psychoeducation, Medication administration, Evaluate responses to treatment, Monitor vital signs and CBGs as ordered, Perform/monitor CIWA, COWS, AIMS and Fall Risk screenings as ordered, Perform wound care treatments as ordered.  Evaluation of Outcomes: Not Met   LCSW Treatment Plan for Primary Diagnosis: Schizoaffective disorder ([place]) Long Term Goal(s): Safe transition to appropriate next level of care at discharge, Engage patient in therapeutic group addressing interpersonal concerns.  Short Term Goals: Engage patient in aftercare planning with referrals and resources, Increase social support, Increase ability to appropriately verbalize feelings, Increase emotional regulation, Facilitate acceptance of mental health diagnosis and concerns and Increase skills for wellness and recovery  Therapeutic Interventions: Assess for all discharge needs, 1 to 1 time with Social worker, Explore available resources and support systems, Assess for adequacy in community support network, Educate family and significant other(s) on suicide prevention, Complete Psychosocial Assessment, Interpersonal group therapy.  Evaluation of Outcomes: Not Met   Progress in Treatment: Attending groups: No. Participating in groups: No. Taking medication as prescribed: Yes. Toleration medication:  Yes. Family/Significant other contact made: No, will contact:  once permission is given.  Patient understands diagnosis: Yes. Discussing patient identified problems/goals with staff: Yes. Medical problems stabilized or resolved: Yes. Denies suicidal/homicidal ideation: Yes. Issues/concerns per patient self-inventory: No. Other: none  New problem(s) identified: No, Describe:  none  New Short Term/Long Term Goal(s): detox, elimination of symptoms of psychosis, medication management for mood stabilization; elimination of SI thoughts; development of comprehensive mental wellness/sobriety plan.  Patient Goals: "staying focused and trying to get out"   Discharge Plan or Barriers: CSW will assist the patient in developing appropriate discharge plans.   Reason for Continuation of Hospitalization: Aggression Anxiety Depression Hallucinations Medication stabilization  Estimated Length of Stay:  1-7 days  Recreational Therapy: Patient Stressors: N/A Patient Goal: Patient will engage in groups without prompting or encouragement from LRT x3 group sessions within 5 recreation therapy group sessions  Attendees: Patient: Ernest Cook 04/03/2020 1:13 PM  Physician: Dr. Weber Cooks, MD 04/03/2020 1:13 PM  Nursing: Graceann Congress, RN 04/03/2020 1:13 PM  RN Care Manager: 04/03/2020 1:13 PM  Social Worker: Assunta Curtis, [place] 04/03/2020 1:13 PM  Recreational Therapist:  04/03/2020 1:13 PM  Other:  04/03/2020 1:13 PM  Other:  04/03/2020 1:13 PM  Other: 04/03/2020 1:13 PM    Scribe for Treatment Team: Rozann Lesches, LCSW 04/03/2020 1:13 PM

## 2020-04-03 NOTE — Plan of Care (Signed)
Pt rates anxiety 7/10. Pt denies depression, SI, HI and AVH. Pt was educated on care plan and verbalizes understanding. Pt was encouraged to attend groups. Torrie Mayers RN  Problem: Education: Goal: Knowledge of  General Education information/materials will improve Outcome: Progressing Goal: Emotional status will improve Outcome: Progressing Goal: Mental status will improve Outcome: Progressing Goal: Verbalization of understanding the information provided will improve Outcome: Progressing   Problem: Activity: Goal: Interest or engagement in activities will improve Outcome: Progressing Goal: Sleeping patterns will improve Outcome: Progressing   Problem: Coping: Goal: Ability to verbalize frustrations and anger appropriately will improve Outcome: Progressing Goal: Ability to demonstrate self-control will improve Outcome: Progressing   Problem: Health Behavior/Discharge Planning: Goal: Identification of resources available to assist in meeting health care needs will improve Outcome: Progressing Goal: Compliance with treatment plan for underlying cause of condition will improve Outcome: Progressing   Problem: Physical Regulation: Goal: Ability to maintain clinical measurements within normal limits will improve Outcome: Progressing   Problem: Safety: Goal: Periods of time without injury will increase Outcome: Progressing   Problem: Activity: Goal: Will verbalize the importance of balancing activity with adequate rest periods Outcome: Progressing   Problem: Education: Goal: Will be free of psychotic symptoms Outcome: Progressing Goal: Knowledge of the prescribed therapeutic regimen will improve Outcome: Progressing   Problem: Coping: Goal: Coping ability will improve Outcome: Progressing Goal: Will verbalize feelings Outcome: Progressing   Problem: Health Behavior/Discharge Planning: Goal: Compliance with prescribed medication regimen will improve Outcome:  Progressing   Problem: Role Relationship: Goal: Ability to communicate needs accurately will improve Outcome: Progressing Goal: Ability to interact with others will improve Outcome: Progressing   Problem: Safety: Goal: Ability to redirect hostility and anger into socially appropriate behaviors will improve Outcome: Progressing Goal: Ability to remain free from injury will improve Outcome: Progressing   Problem: Self-Care: Goal: Ability to participate in self-care as condition permits will improve Outcome: Progressing   Problem: Self-Concept: Goal: Will verbalize positive feelings about self Outcome: Progressing

## 2020-04-03 NOTE — Progress Notes (Signed)
Recreation Therapy Notes   Date: 04/03/2020  Time: 9:30 am  Location: Craft room   Behavioral response: Appropriate  Intervention Topic: Necessities   Discussion/Intervention:  Group content on today was focused on necessities. The group defined necessities and how they determine their necessities. Individuals expressed how many necessities they have and if it changes from day to day. Patients described the difference between wants and needs. The group explained how they have overspent on wants in the past. Individuals described a reoccurring necessity for them. The intervention "What I need" helped patients differentiate between wants and needs.  Clinical Observations/Feedback:  Patient came to group and defined necessities as anything you need. He identified reoccurring necessities as rent and electricity. Individual was social with peers and staff while participating in the intervention. Ernest Cook LRT/CTRS          Shelbe Haglund 04/03/2020 11:07 AM

## 2020-04-03 NOTE — Progress Notes (Signed)
[facility] MD Progress Note  04/03/2020 1:51 PM Ernest Cook  MRN:  [id number] Subjective: Follow-up for this patient with schizophrenia. Patient was seen today and he attended treatment team. Patient seems to have calm down a lot since yesterday. He is able to sit down and have a lucid conversation with fairly appropriate affect. He confirms for me that before coming to the hospital he had not slept for 4 days. He claims to have only very vague memories of why he was picked up and brought to the hospital. He denies any suicidal or homicidal ideation now or any hallucinations. On the other hand he still is showing some inappropriate behavior. Earlier in the afternoon he was talking to his mother on the telephone and ended the phone call by slamming the wall out of frustration. He has been compliant with medicine. Principal Problem: Schizoaffective disorder (HCC) Diagnosis: Principal Problem:   Schizoaffective disorder (HCC)  Total Time spent with patient: 30 minutes  Past Psychiatric History: History of schizophrenia with previous response to medicine. Receives act team service  Past Medical History:  Past Medical History:  Diagnosis Date  . Schizo affective schizophrenia [facility])     Past Surgical History:  Procedure Laterality Date  . SKIN GRAFT Left unknown   Pt reports left foot and leg skin graft   Family History: History reviewed. No pertinent family history. Family Psychiatric  History: See previous Social History:  Social History   Substance and Sexual Activity  Alcohol Use No     Social History   Substance and Sexual Activity  Drug Use Yes  . Types: Marijuana   Comment: "every now and again"    Social History   Socioeconomic History  . Marital status: Single    Spouse name: Not on file  . Number of children: Not on file  . Years of education: Not on file  . Highest education level: Not on file  Occupational History  . Not on file  Tobacco Use  . Smoking status:  Current Every Day Smoker    Packs/day: 0.25    Types: Cigarettes  . Smokeless tobacco: Never Used  Substance and Sexual Activity  . Alcohol use: No  . Drug use: Yes    Types: Marijuana    Comment: "every now and again"  . Sexual activity: Yes    Birth control/protection: Condom    Comment: Pt reports he is attracted to men  Other Topics Concern  . Not on file  Social History Narrative  . Not on file   Social Determinants of Health   Financial Resource Strain:   . Difficulty of Paying Living Expenses:   Food Insecurity:   . Worried About Programme researcher, broadcasting/film/video in the Last Year:   . Barista in the Last Year:   Transportation Needs:   . Freight forwarder (Medical):   Marland Kitchen Lack of Transportation (Non-Medical):   Physical Activity:   . Days of Exercise per Week:   . Minutes of Exercise per Session:   Stress:   . Feeling of Stress :   Social Connections:   . Frequency of Communication with Friends and Family:   . Frequency of Social Gatherings with Friends and Family:   . Attends Religious Services:   . Active Member of Clubs or Organizations:   . Attends Banker Meetings:   Marland Kitchen Marital Status:    Additional Social History:  Sleep: Fair  Appetite:  Fair  Current Medications: Current Facility-Administered Medications  Medication Dose Route Frequency Provider Last Rate Last Admin  . acetaminophen (TYLENOL) tablet 650 mg  650 mg Oral Q6H PRN Dixon, Rashaun M, NP      . alum & mag hydroxide-simeth (MAALOX/MYLANTA) 200-200-20 MG/5ML suspension 30 mL  30 mL Oral Q4H PRN Dixon, Rashaun M, NP      . benztropine (COGENTIN) tablet 0.5 mg  0.5 mg Oral BID Greco Gastelum, Jackquline DenmarkJohn T, MD   0.5 mg at 04/03/20 0754  . hydrOXYzine (ATARAX/VISTARIL) tablet 25 mg  25 mg Oral TID PRN Jearld Leschixon, Rashaun M, NP   25 mg at 04/02/20 1649  . lithium carbonate (ESKALITH) CR tablet 450 mg  450 mg Oral Q12H Uchechi Denison, Jackquline DenmarkJohn T, MD   450 mg at 04/03/20 0754  .  LORazepam (ATIVAN) tablet 2 mg  2 mg Oral Q4H PRN Tishina Lown, Jackquline DenmarkJohn T, MD   2 mg at 04/03/20 1253   Or  . LORazepam (ATIVAN) injection 2 mg  2 mg Intramuscular Q4H PRN Tye Vigo T, MD      . magnesium hydroxide (MILK OF MAGNESIA) suspension 30 mL  30 mL Oral Daily PRN Durwin Noraixon, Rashaun M, NP      . risperiDONE (RISPERDAL) tablet 3 mg  3 mg Oral QHS Kyrstal Monterrosa T, MD   3 mg at 04/02/20 2107    Lab Results:  Results for orders placed or performed during the hospital encounter of 04/01/20 (from the past 48 hour(s))  Comprehensive metabolic panel     Status: Abnormal   Collection Time: 04/01/20  4:02 PM  Result Value Ref Range   Sodium 139 135 - 145 mmol/L   Potassium 3.7 3.5 - 5.1 mmol/L   Chloride 104 98 - 111 mmol/L   CO2 26 22 - 32 mmol/L   Glucose, Bld 108 (H) 70 - 99 mg/dL    Comment: Glucose reference range applies only to samples taken after fasting for at least 8 hours.   BUN 11 6 - 20 mg/dL   Creatinine, Ser 1.610.95 0.61 - 1.24 mg/dL   Calcium 9.3 8.9 - 09.610.3 mg/dL   Total Protein 7.5 6.5 - 8.1 g/dL   Albumin 4.7 3.5 - 5.0 g/dL   AST 26 15 - 41 U/L   ALT 19 0 - 44 U/L   Alkaline Phosphatase 57 38 - 126 U/L   Total Bilirubin 0.6 0.3 - 1.2 mg/dL   GFR calc non Af Amer >60 >60 mL/min   GFR calc Af Amer >60 >60 mL/min   Anion gap 9 5 - 15    Comment: Performed at [facility], [address] [place], [place] [address]  Ethanol     Status: None   Collection Time: 04/01/20  4:02 PM  Result Value Ref Range   Alcohol, Ethyl (B) <10 <10 mg/dL    Comment: (NOTE) Lowest detectable limit for serum alcohol is 10 mg/dL.  For medical purposes only. Performed at [facility], [address] [place], [place] [address]   Salicylate level     Status: Abnormal   Collection Time: 04/01/20  4:02 PM  Result Value Ref Range   Salicylate Lvl <7.0 (L) 7.0 - 30.0 mg/dL    Comment: Performed at [facility], [address] [place], [place] [address]  Acetaminophen  level     Status: Abnormal   Collection Time: 04/01/20  4:02 PM  Result Value Ref Range   Acetaminophen (Tylenol), Serum <  10 (L) 10 - 30 ug/mL    Comment: (NOTE) Therapeutic concentrations vary significantly. A range of 10-30 ug/mL  may be an effective concentration for many patients. However, some  are best treated at concentrations outside of this range. Acetaminophen concentrations >150 ug/mL at 4 hours after ingestion  and >50 ug/mL at 12 hours after ingestion are often associated with  toxic reactions.  Performed at [facility], [address] [place], [place] [address]   cbc     Status: Abnormal   Collection Time: 04/01/20  4:02 PM  Result Value Ref Range   WBC 13.8 (H) 4.0 - 10.5 K/uL   RBC 5.09 4.22 - 5.81 MIL/uL   Hemoglobin 13.8 13.0 - 17.0 g/dL   HCT 94.4 39 - 52 %   MCV 82.1 80.0 - 100.0 fL   MCH 27.1 26.0 - 34.0 pg   MCHC 33.0 30.0 - 36.0 g/dL   RDW 96.7 59.1 - 63.8 %   Platelets 341 150 - 400 K/uL   nRBC 0.0 0.0 - 0.2 %    Comment: Performed at [facility], [address] [place], [place] [address]  Lithium level     Status: Abnormal   Collection Time: 04/01/20  4:02 PM  Result Value Ref Range   Lithium Lvl 0.24 (L) 0.60 - 1.20 mmol/L    Comment: Performed at [facility], [address] [place], [place] [address]  SARS Coronavirus 2 by RT PCR (hospital order, performed in [facility] hospital lab) Nasopharyngeal Nasopharyngeal Swab     Status: None   Collection Time: 04/01/20  6:30 PM   Specimen: Nasopharyngeal Swab  Result Value Ref Range   SARS Coronavirus 2 NEGATIVE NEGATIVE    Comment: (NOTE) SARS-CoV-2 target nucleic acids are NOT DETECTED.  The SARS-CoV-2 RNA is generally detectable in upper and lower respiratory specimens during the acute phase of infection. The lowest concentration of SARS-CoV-2 viral copies this assay can detect is 250 copies / mL. A negative result does not preclude SARS-CoV-2 infection and  should not be used as the sole basis for treatment or other patient management decisions.  A negative result may occur with improper specimen collection / handling, submission of specimen other than nasopharyngeal swab, presence of viral mutation(s) within the areas targeted by this assay, and inadequate number of viral copies (<250 copies / mL). A negative result must be combined with clinical observations, patient history, and epidemiological information.  Fact Sheet for Patients:   BoilerBrush.com.cy  Fact Sheet for Healthcare Providers: https://pope.com/  This test is not yet approved or  cleared by the [place] FDA and has been authorized for detection and/or diagnosis of SARS-CoV-2 by FDA under an Emergency Use Authorization (EUA).  This EUA will remain in effect (meaning this test can be used) for the duration of the COVID-19 declaration under Section 564(b)(1) of the Act, 21 U.S.C. section 360bbb-3(b)(1), unless the authorization is terminated or revoked sooner.  Performed at [facility], [address] [place], [place] [address]     Blood Alcohol level:  Lab Results  Component Value Date   [facility] <10 04/01/2020   ETH <5 03/24/2016    Metabolic Disorder Labs: Lab Results  Component Value Date   HGBA1C 6.0 03/26/2016   Lab Results  Component Value Date   PROLACTIN 48.0 (H) 03/26/2016   Lab Results  Component Value Date   CHOL 158 03/26/2016   TRIG 134 03/26/2016   HDL 29 (L) 03/26/2016  CHOLHDL 5.4 03/26/2016   VLDL 27 03/26/2016   LDLCALC 102 (H) 03/26/2016    Physical Findings: AIMS: Facial and Oral Movements Muscles of Facial Expression: None, normal Lips and Perioral Area: None, normal Jaw: None, normal Tongue: None, normal,Extremity Movements Upper (arms, wrists, hands, fingers): None, normal Lower (legs, knees, ankles, toes): None, normal, Trunk Movements Neck, shoulders, hips:  None, normal, Overall Severity Severity of abnormal movements (highest score from questions above): None, normal Incapacitation due to abnormal movements: None, normal Patient's awareness of abnormal movements (rate only patient's report): No Awareness, Dental Status Current problems with teeth and/or dentures?: No Does patient usually wear dentures?: No  CIWA:    COWS:     Musculoskeletal: Strength & Muscle Tone: within normal limits Gait & Station: normal Patient leans: N/A  Psychiatric Specialty Exam: Physical Exam Vitals and nursing note reviewed.  Constitutional:      Appearance: He is well-developed.  HENT:     Head: Normocephalic and atraumatic.  Eyes:     Conjunctiva/sclera: Conjunctivae normal.     Pupils: Pupils are equal, round, and reactive to light.  Cardiovascular:     Heart sounds: Normal heart sounds.  Pulmonary:     Effort: Pulmonary effort is normal.  Abdominal:     Palpations: Abdomen is soft.  Musculoskeletal:        General: Normal range of motion.     Cervical back: Normal range of motion.  Skin:    General: Skin is warm and dry.  Neurological:     General: No focal deficit present.     Mental Status: He is alert.  Psychiatric:        Attention and Perception: He is inattentive.        Mood and Affect: Affect is blunt.        Speech: Speech is delayed.        Behavior: Behavior is agitated. Behavior is not aggressive.        Thought Content: Thought content is not delusional. Thought content does not include homicidal or suicidal ideation.        Cognition and Memory: Memory is impaired.        Judgment: Judgment is impulsive.     Review of Systems  Constitutional: Negative.   HENT: Negative.   Eyes: Negative.   Respiratory: Negative.   Cardiovascular: Negative.   Gastrointestinal: Negative.   Musculoskeletal: Negative.   Skin: Negative.   Neurological: Negative.   Psychiatric/Behavioral: Positive for dysphoric mood and sleep  disturbance. The patient is nervous/anxious.     Blood pressure 131/88, pulse 78, temperature 98.6 F (37 C), temperature source Oral, resp. rate 17, height 5\' 9"  (1.753 m), weight 95.3 kg, SpO2 100 %.Body mass index is 31.01 kg/m.  General Appearance: Casual  Eye Contact:  Fair  Speech:  Clear and Coherent  Volume:  Decreased  Mood:  Euthymic  Affect:  Constricted  Thought Process:  Coherent  Orientation:  Full (Time, Place, and Person)  Thought Content:  Illogical  Suicidal Thoughts:  No  Homicidal Thoughts:  No  Memory:  Immediate;   Fair Recent;   Fair Remote;   Fair  Judgement:  Fair  Insight:  Fair  Psychomotor Activity:  Normal  Concentration:  Concentration: Fair  Recall:  of Knowledge:  Fair  Language:  Fair  Akathisia:  No  Handed:  Right  AIMS (if indicated):     Assets:  Desire for Improvement Housing Physical Health Resilience Social Support  ADL's:  Intact  Cognition:  Impaired,  Mild  Sleep:  Number of Hours: 4     Treatment Plan Summary: Daily contact with patient to assess and evaluate symptoms and progress in treatment, Medication management and Plan Improved but possibly still not at baseline. Still didn't sleep well last night. Encourage patient to attend groups and to settle down. Reassured him he is unlikely to be in the hospital long but I want to make sure he is stable before discharging him. No change to other medicine for today  Mordecai Rasmussen, MD 04/03/2020, 1:51 PM

## 2020-04-03 NOTE — Progress Notes (Signed)
Pt is sleeping

## 2020-04-03 NOTE — Progress Notes (Signed)
Pt got agitated on the phone while talking to his mother. He began banging the phone on the wall. Pt received a PRN and was educated on calming techniques. Torrie Mayers RN

## 2020-04-03 NOTE — Progress Notes (Signed)
Recreation Therapy Notes  INPATIENT RECREATION THERAPY ASSESSMENT  Patient Details Name: Ernest Cook MRN: [id number] DOB: 1991-08-10 Today's Date: 04/03/2020       Information Obtained From: Patient  Able to Participate in Assessment/Interview: Yes  Patient Presentation: Responsive  Reason for Admission (Per Patient): Active Symptoms  Patient Stressors:    Coping Skills:   Other (Comment) (Sleep)  Leisure Interests (2+):  Individual - TV, Music - Listen (Work)  Frequency of Recreation/Participation: Weekly  Awareness of Community Resources:  Yes  Community Resources:  Library, Warehouse manager  Current Use:    If no, Barriers?:    Expressed Interest in [employer] Information:    [employer] of Residence:  [employer]  Patient Main Form of Transportation: Walk  Patient Strengths:  Giving  Patient Identified Areas of Improvement:  Saying no and staying to myself  Patient Goal for Hospitalization:  To get out  Current SI (including self-harm):  No  Current HI:  No  Current AVH: No  Staff Intervention Plan: Group Attendance, Collaborate with Interdisciplinary Treatment Team  Consent to Intern Participation: N/A  Ernest Cook 04/03/2020, 2:59 PM

## 2020-04-03 NOTE — Progress Notes (Signed)
Pt was easily angered when we would not allow him to have sweat pants with the string in them. He hit a window. After staff talked to him he calmed down. Pt also wants to be discharged. Pt is calm. Torrie Mayers RN

## 2020-04-04 DIAGNOSIS — F250 Schizoaffective disorder, bipolar type: Secondary | ICD-10-CM | POA: Diagnosis not present

## 2020-04-04 MED ORDER — DOCUSATE SODIUM 100 MG PO CAPS: 100.0000 mg | ORAL_CAPSULE | Freq: Every day | ORAL | 1 refills | Status: DC | PRN

## 2020-04-04 MED ORDER — LITHIUM CARBONATE ER 450 MG PO TBCR: 450.0000 mg | EXTENDED_RELEASE_TABLET | Freq: Two times a day (BID) | ORAL | 1 refills | Status: DC

## 2020-04-04 MED ORDER — TEMAZEPAM 15 MG PO CAPS: 15.0000 mg | ORAL_CAPSULE | Freq: Every day | ORAL | 1 refills | Status: DC

## 2020-04-04 MED ORDER — BENZTROPINE MESYLATE 0.5 MG PO TABS: 0.5000 mg | ORAL_TABLET | Freq: Two times a day (BID) | ORAL | 1 refills | Status: DC

## 2020-04-04 MED ORDER — RISPERIDONE 3 MG PO TABS: 3.0000 mg | ORAL_TABLET | Freq: Every day | ORAL | 1 refills | Status: DC

## 2020-04-04 NOTE — BHH Suicide Risk Assessment (Signed)
[facility] Discharge Suicide Risk Assessment   Principal Problem: Schizoaffective disorder [facility]) Discharge Diagnoses: Principal Problem:   Schizoaffective disorder (HCC)   Total Time spent with patient: 30 minutes  Musculoskeletal: Strength & Muscle Tone: within normal limits Gait & Station: normal Patient leans: N/A  Psychiatric Specialty Exam: Review of Systems  Constitutional: Negative.   HENT: Negative.   Eyes: Negative.   Respiratory: Negative.   Cardiovascular: Negative.   Gastrointestinal: Negative.   Musculoskeletal: Negative.   Skin: Negative.   Neurological: Negative.   Psychiatric/Behavioral: Negative.     Blood pressure 131/88, pulse 78, temperature 98.6 F (37 C), temperature source Oral, resp. rate 17, height 5\' 9"  (1.753 m), weight 95.3 kg, SpO2 100 %.Body mass index is 31.01 kg/m.  General Appearance: Casual  Eye Contact::  Fair  Speech:  Clear and Coherent409  Volume:  Normal  Mood:  Euthymic  Affect:  Constricted  Thought Process:  Goal Directed  Orientation:  Full (Time, Place, and Person)  Thought Content:  Logical  Suicidal Thoughts:  No  Homicidal Thoughts:  No  Memory:  Immediate;   Fair Recent;   Fair Remote;   Fair  Judgement:  Fair  Insight:  Fair  Psychomotor Activity:  Normal  Concentration:  Fair  Recall:  [phone] of Knowledge:Fair  Language: Fair  Akathisia:  No  Handed:  Right  AIMS (if indicated):     Assets:  Desire for Improvement Housing Physical Health Resilience Social Support  Sleep:  Number of Hours: 4  Cognition: WNL  ADL's:  Intact   Mental Status Per Nursing Assessment::   On Admission:  NA  Demographic Factors:  Living alone  Loss Factors: NA  Historical Factors: Impulsivity  Risk Reduction Factors:   Positive social support and Positive therapeutic relationship  Continued Clinical Symptoms:  Schizophrenia:   Paranoid or undifferentiated type  Cognitive Features That Contribute To Risk:  None     Suicide Risk:  Minimal: No identifiable suicidal ideation.  Patients presenting with no risk factors but with morbid ruminations; may be classified as minimal risk based on the severity of the depressive symptoms    Plan Of Care/Follow-up recommendations:  Activity:  Activity as tolerated Diet:  Regular diet Other:  Follow-up with your act team  [phone], MD 04/04/2020, 9:34 AM

## 2020-04-04 NOTE — Progress Notes (Signed)
Pt denies SI, HI and AVH. Pt was educated on dc plan and verbalizes understanding. Pt received belongings, dc packet and prescriptions. Kari Kerth RN 

## 2020-04-04 NOTE — Progress Notes (Signed)
Patient alert and oriented x 3 with periods of confusion to situations he appears responding to internal stimuli he denies SI/HI/AVH but noted talking to himself, pacing the unit during a fir alarm, staff tried to redirect him but he was argumentative and was attempting to open exit doors, he isolates to self for most of the shift and not interacting appropriately with peers and staff. Patient was offered emotional support and encouraged to attend evening wrap up group. Patient didn't attend group he was complaint with medication regimen, 15 minutes safety checks maintained will continue to monitor.

## 2020-04-04 NOTE — Progress Notes (Signed)
Recreation Therapy Notes   Date: 04/04/2020  Time: 9:30 am  Location: Craft room   Behavioral response: Appropriate  Intervention Topic: Goals    Discussion/Intervention:  Group content on today was focused on goals. Patients described what goals are and how they define goals. Individuals expressed how they go about setting goals and reaching them. The group identified how important goals are and if they make short term goals to reach long term goals. Patients described how many goals they work on at a time and what affects them not reaching their goal. Individuals described how much time they put into planning and obtaining their goals. The group participated in the intervention "My Goal Board" and made personal goal boards to help them achieve their goal. Clinical Observations/Feedback:  Patient came to group and expressed that his goal is to work on his mental health. Individual was social with peers and staff while participating in the intervention. Celes Dedic LRT/CTRS         Adrien Dietzman 04/04/2020 11:27 AM

## 2020-04-04 NOTE — BHH Counselor (Signed)
Adult Comprehensive Assessment  Patient ID: Ernest Cook, male   DOB: 1991-06-02, 29 y.o.   MRN: [id number]  Information Source:    Current Stressors:     Living/Environment/Situation:  Living Arrangements: Parent  Family History:     Childhood History:     Education:     Employment/Work Situation:      Surveyor, quantity Resources:      Alcohol/Substance Abuse:      Social Support System:      Leisure/Recreation:      Strengths/Needs:      Discharge Plan:      Summary/Recommendations:   Summary and Recommendations (to be completed by the evaluator): Pt is scheduled for discharge today. Pt is a 29 yr old male with a history of schizoaffective disorder who was brought to the ED due to decompensation. Pt is followed by [facility] in [place], [place] and is agreeable to resume treatment with current provider. Patient will benefit from crisis stabilization, medication evaluation, group therapy and psychoeducation, in addition to case management for discharge planning. At discharge it is recommended that Patient adhere to the established discharge plan and continue in treatment.  Ernest Cook. 04/04/2020

## 2020-04-04 NOTE — Plan of Care (Signed)
  Problem: Group Participation Goal: STG - Patient will engage in groups without prompting or encouragement from LRT x3 group sessions within 5 recreation therapy group sessions Description: STG - Patient will engage in groups without prompting or encouragement from LRT x3 group sessions within 5 recreation therapy group sessions Outcome: Completed/Met

## 2020-04-04 NOTE — Progress Notes (Signed)
  [facility] Adult Case Management Discharge Plan :  Will you be returning to the same living situation after discharge:  Yes,  lives alone At discharge, do you have transportation home?: Yes,  safe transport Do you have the ability to pay for your medications: Yes,  Medicaid  Release of information consent forms completed and in the chart;  Patient's signature needed at discharge.  Patient to Follow up at:  Follow-up Information    [facility] Follow up.   Why: Your ACT Team is scheduled to meet with you on Thursday, August 19th. Thank you. Contact information: [name] [place] [address] [phone]               Next level of care provider has access to [facility] Link:no  Safety Planning and Suicide Prevention discussed: Yes,  with pt; declined family contact  Have you used any form of tobacco in the last 30 days? (Cigarettes, Smokeless Tobacco, Cigars, and/or Pipes): No  Has patient been referred to the Quitline?: N/A patient is not a smoker  Patient has been referred for addiction treatment: N/A  Suzan Slick, LCSW 04/04/2020, 11:43 AM

## 2020-04-04 NOTE — Discharge Summary (Signed)
Physician Discharge Summary Note  Patient:  Ernest Cook is an 29 y.o., male MRN:  [id number] DOB:  15-Jul-1991 Patient phone:  [phone] (home)  Patient address:   [address] [place] [address],  Total Time spent with patient: 30 minutes  Date of Admission:  04/01/2020 Date of Discharge: 04/04/2020  Reason for Admission: Admitted because of onset of agitated confused behavior  Principal Problem: Schizoaffective disorder [facility]) Discharge Diagnoses: Principal Problem:   Schizoaffective disorder [facility])   Past Psychiatric History: Long history of schizophrenia or schizoaffective disorder with appropriate outpatient treatment  Past Medical History:  Past Medical History:  Diagnosis Date  . Schizo affective schizophrenia [facility])     Past Surgical History:  Procedure Laterality Date  . SKIN GRAFT Left unknown   Pt reports left foot and leg skin graft   Family History: History reviewed. No pertinent family history. Family Psychiatric  History: None reported Social History:  Social History   Substance and Sexual Activity  Alcohol Use No     Social History   Substance and Sexual Activity  Drug Use Yes  . Types: Marijuana   Comment: "every now and again"    Social History   Socioeconomic History  . Marital status: Single    Spouse name: Not on file  . Number of children: Not on file  . Years of education: Not on file  . Highest education level: Not on file  Occupational History  . Not on file  Tobacco Use  . Smoking status: Current Every Day Smoker    Packs/day: 0.25    Types: Cigarettes  . Smokeless tobacco: Never Used  Substance and Sexual Activity  . Alcohol use: No  . Drug use: Yes    Types: Marijuana    Comment: "every now and again"  . Sexual activity: Yes    Birth control/protection: Condom    Comment: Pt reports he is attracted to men  Other Topics Concern  . Not on file  Social History Narrative  . Not on file   Social Determinants of  Health   Financial Resource Strain:   . Difficulty of Paying Living Expenses:   Food Insecurity:   . Worried About Charity fundraiser in the Last Year:   . Arboriculturist in the Last Year:   Transportation Needs:   . Film/video editor (Medical):   Marland Kitchen Lack of Transportation (Non-Medical):   Physical Activity:   . Days of Exercise per Week:   . Minutes of Exercise per Session:   Stress:   . Feeling of Stress :   Social Connections:   . Frequency of Communication with Friends and Family:   . Frequency of Social Gatherings with Friends and Family:   . Attends Religious Services:   . Active Member of Clubs or Organizations:   . Attends Archivist Meetings:   Marland Kitchen Marital Status:     Hospital Course: Admitted to the hospital.  Initially confused and agitated but was started back on his psychiatric medicine.  Within an hour he showed great improvement in his behavior.  Was lucid calm and appropriate.  Did not report hallucinations.  Denied suicidal or homicidal ideation.  Slept well at night and was agreeable to continued outpatient treatment with his act team.  No longer met commitment criteria and appeared to have returned to his baseline.  Referred back home with appropriate follow-up  Physical Findings: AIMS: Facial and Oral Movements Muscles of Facial Expression: None, normal  Lips and Perioral Area: None, normal Jaw: None, normal Tongue: None, normal,Extremity Movements Upper (arms, wrists, hands, fingers): None, normal Lower (legs, knees, ankles, toes): None, normal, Trunk Movements Neck, shoulders, hips: None, normal, Overall Severity Severity of abnormal movements (highest score from questions above): None, normal Incapacitation due to abnormal movements: None, normal Patient's awareness of abnormal movements (rate only patient's report): No Awareness, Dental Status Current problems with teeth and/or dentures?: No Does patient usually wear dentures?: No  CIWA:     COWS:     Musculoskeletal: Strength & Muscle Tone: within normal limits Gait & Station: normal Patient leans: N/A  Psychiatric Specialty Exam: Physical Exam Vitals and nursing note reviewed.  Constitutional:      Appearance: He is well-developed.  HENT:     Head: Normocephalic and atraumatic.  Eyes:     Conjunctiva/sclera: Conjunctivae normal.     Pupils: Pupils are equal, round, and reactive to light.  Cardiovascular:     Heart sounds: Normal heart sounds.  Pulmonary:     Effort: Pulmonary effort is normal.  Abdominal:     Palpations: Abdomen is soft.  Musculoskeletal:        General: Normal range of motion.     Cervical back: Normal range of motion.  Skin:    General: Skin is warm and dry.  Neurological:     General: No focal deficit present.     Mental Status: He is alert.  Psychiatric:        Mood and Affect: Mood normal.        Behavior: Behavior normal.     Review of Systems  Constitutional: Negative.   HENT: Negative.   Eyes: Negative.   Respiratory: Negative.   Cardiovascular: Negative.   Gastrointestinal: Negative.   Musculoskeletal: Negative.   Skin: Negative.   Neurological: Negative.   Psychiatric/Behavioral: Negative.     Blood pressure 131/88, pulse 78, temperature 98.6 F (37 C), temperature source Oral, resp. rate 17, height 5' 9" (1.753 m), weight 95.3 kg, SpO2 100 %.Body mass index is 31.01 kg/m.  General Appearance: Casual  Eye Contact:  Good  Speech:  Clear and Coherent  Volume:  Normal  Mood:  Euthymic  Affect:  Congruent  Thought Process:  Goal Directed  Orientation:  Full (Time, Place, and Person)  Thought Content:  Logical  Suicidal Thoughts:  No  Homicidal Thoughts:  No  Memory:  Immediate;   Fair Recent;   Fair Remote;   Fair  Judgement:  Fair  Insight:  Fair  Psychomotor Activity:  Normal  Concentration:  Concentration: Fair  Recall:  [employer] of Knowledge:  Fair  Language:  Fair  Akathisia:  No  Handed:  Right   AIMS (if indicated):     Assets:  Communication Skills Housing Physical Health Resilience  ADL's:  Intact  Cognition:  WNL  Sleep:  Number of Hours: 4     Have you used any form of tobacco in the last 30 days? (Cigarettes, Smokeless Tobacco, Cigars, and/or Pipes): No  Has this patient used any form of tobacco in the last 30 days? (Cigarettes, Smokeless Tobacco, Cigars, and/or Pipes) Yes, No  Blood Alcohol level:  Lab Results  Component Value Date   [facility] <10 04/01/2020   ETH <5 35/45/6256    Metabolic Disorder Labs:  Lab Results  Component Value Date   HGBA1C 6.0 03/26/2016   Lab Results  Component Value Date   PROLACTIN 48.0 (H) 03/26/2016   Lab Results  Component  Value Date   CHOL 158 03/26/2016   TRIG 134 03/26/2016   HDL 29 (L) 03/26/2016   CHOLHDL 5.4 03/26/2016   VLDL 27 03/26/2016   LDLCALC 102 (H) 03/26/2016    See Psychiatric Specialty Exam and Suicide Risk Assessment completed by Attending Physician prior to discharge.  Discharge destination:  Home  Is patient on multiple antipsychotic therapies at discharge:  No   Has Patient had three or more failed trials of antipsychotic monotherapy by history:  No  Recommended Plan for Multiple Antipsychotic Therapies: NA  Discharge Instructions    Diet - low sodium heart healthy   Complete by: As directed    Increase activity slowly   Complete by: As directed      Allergies as of 04/04/2020   No Known Allergies     Medication List    STOP taking these medications   atomoxetine 40 MG capsule Commonly known as: STRATTERA   lithium carbonate 300 MG capsule Replaced by: lithium carbonate 450 MG CR tablet     TAKE these medications     Indication  benztropine 0.5 MG tablet Commonly known as: COGENTIN Take 1 tablet (0.5 mg total) by mouth 2 (two) times daily. What changed:   medication strength  how much to take  when to take this  Indication: Extrapyramidal Reaction caused by Medications    docusate sodium 100 MG capsule Commonly known as: COLACE Take 1 capsule (100 mg total) by mouth daily as needed for mild constipation.  Indication: Constipation   lithium carbonate 450 MG CR tablet Commonly known as: ESKALITH Take 1 tablet (450 mg total) by mouth every 12 (twelve) hours. Replaces: lithium carbonate 300 MG capsule  Indication: Schizoaffective Disorder   risperiDONE 3 MG tablet Commonly known as: RISPERDAL Take 1 tablet (3 mg total) by mouth at bedtime.  Indication: Schizophrenia   risperiDONE microspheres 50 MG injection Commonly known as: RISPERDAL CONSTA Inject 50 mg into the muscle every 14 (fourteen) days.  Indication: Schizophrenia   temazepam 15 MG capsule Commonly known as: RESTORIL Take 1 capsule (15 mg total) by mouth at bedtime.  Indication: [place] Follow up.   Why: Your ACT Team is scheduled to meet with you on Thursday, August 19th. Thank you. Contact information: [address] [place] [address] [phone]               Follow-up recommendations:  Activity:  Activity as tolerated Diet:  Regular diet Other:  Follow-up with his act team  Comments: Prescriptions provided as needed  Signed: Alethia Berthold, MD 04/04/2020, 6:12 PM

## 2020-04-04 NOTE — Progress Notes (Signed)
Recreation Therapy Notes  INPATIENT RECREATION TR PLAN  Patient Details Name: Ernest Cook MRN: [id number] DOB: 1990/09/04 Today's Date: 04/04/2020  Rec Therapy Plan Is patient appropriate for Therapeutic Recreation?: Yes Treatment times per week: At least 3 Estimated Length of Stay: 5-7 days TR Treatment/Interventions: Group participation (Comment)  Discharge Criteria Pt will be discharged from therapy if:: Discharged Treatment plan/goals/alternatives discussed and agreed upon by:: Patient/family  Discharge Summary Short term goals set: Patient will engage in groups without prompting or encouragement from LRT x3 group sessions within 5 recreation therapy group sessions Short term goals met: Complete Progress toward goals comments: Groups attended Which groups?: Goal setting, Stress management, Other (Comment) (Necessities) Reason goals not met: N/A Therapeutic equipment acquired: N/A Reason patient discharged from therapy: Discharge from hospital Pt/family agrees with progress & goals achieved: Yes Date patient discharged from therapy: 04/04/20   Karlyn Glasco 04/04/2020, 12:58 PM

## 2020-04-04 NOTE — [facility] Counselor (Signed)
CSW spoke with Hart([facility] ACTT member) who reports they will plan to meet with the pt in person on 04/05/2020.

## 2020-04-04 NOTE — Progress Notes (Addendum)
Pt is hoping for discharge. Pt was educated on care plan and verbalizes understanding. Pt was encouraged to attend groups.  Torrie Mayers RN

## 2020-04-04 NOTE — [facility] Suicide Risk Assessment (Signed)
[facility] INPATIENT:  Family/Significant Other Suicide Prevention Education  Suicide Prevention Education:  Patient Refusal for Family/Significant Other Suicide Prevention Education: The patient Ernest Cook has refused to provide written consent for family/significant other to be provided Family/Significant Other Suicide Prevention Education during admission and/or prior to discharge.  Physician notified.  Tyffani Foglesong T [name] 04/04/2020, 11:35 AM

## 2020-05-27 ENCOUNTER — Emergency Department
Admission: EM | Admit: 2020-05-27 | Discharge: 2020-05-28 | Disposition: A | Discharge status: Home / Self Care | Payer: Medicaid Other | Attending: Emergency Medicine | Admitting: Emergency Medicine

## 2020-05-27 ENCOUNTER — Other Ambulatory Visit: Payer: Self-pay

## 2020-05-27 DIAGNOSIS — F17210 Nicotine dependence, cigarettes, uncomplicated: Secondary | ICD-10-CM | POA: Insufficient documentation

## 2020-05-27 DIAGNOSIS — Z20822 Contact with and (suspected) exposure to covid-19: Secondary | ICD-10-CM | POA: Diagnosis not present

## 2020-05-27 DIAGNOSIS — F203 Undifferentiated schizophrenia: Secondary | ICD-10-CM | POA: Diagnosis present

## 2020-05-27 DIAGNOSIS — F250 Schizoaffective disorder, bipolar type: Secondary | ICD-10-CM

## 2020-05-27 DIAGNOSIS — F1220 Cannabis dependence, uncomplicated: Secondary | ICD-10-CM | POA: Diagnosis present

## 2020-05-27 DIAGNOSIS — R4689 Other symptoms and signs involving appearance and behavior: Secondary | ICD-10-CM

## 2020-05-27 LAB — COMPREHENSIVE METABOLIC PANEL
ALT: 18 U/L (ref 0–44)
AST: 32 U/L (ref 15–41)
Albumin: 4.7 g/dL (ref 3.5–5.0)
Alkaline Phosphatase: 59 U/L (ref 38–126)
Anion gap: 11 (ref 5–15)
BUN: 11 mg/dL (ref 6–20)
CO2: 22 mmol/L (ref 22–32)
Calcium: 9.4 mg/dL (ref 8.9–10.3)
Chloride: 103 mmol/L (ref 98–111)
Creatinine, Ser: 0.85 mg/dL (ref 0.61–1.24)
GFR, Estimated: >60 mL/min (ref >60)
Glucose, Bld: 104 mg/dL — ABNORMAL HIGH (ref 70–99)
Potassium: 4.2 mmol/L (ref 3.5–5.1)
Sodium: 136 mmol/L (ref 135–145)
Total Bilirubin: 0.9 mg/dL (ref 0.3–1.2)
Total Protein: 7.6 g/dL (ref 6.5–8.1)

## 2020-05-27 LAB — CBC WITH DIFFERENTIAL/PLATELET
Abs Immature Granulocytes: 0.04 10*3/uL (ref 0.00–0.07)
Basophils Absolute: 0.1 10*3/uL (ref 0.0–0.1)
Basophils Relative: 1 %
Eosinophils Absolute: 0 10*3/uL (ref 0.0–0.5)
Eosinophils Relative: 0 %
HCT: 42.8 % (ref 39.0–52.0)
Hemoglobin: 13.7 g/dL (ref 13.0–17.0)
Immature Granulocytes: 0 %
Lymphocytes Relative: 23 %
Lymphs Abs: 2.4 10*3/uL (ref 0.7–4.0)
MCH: 26.8 pg (ref 26.0–34.0)
MCHC: 32 g/dL (ref 30.0–36.0)
MCV: 83.6 fL (ref 80.0–100.0)
Monocytes Absolute: 0.8 10*3/uL (ref 0.1–1.0)
Monocytes Relative: 8 %
Neutro Abs: 7.2 10*3/uL (ref 1.7–7.7)
Neutrophils Relative %: 68 %
Platelets: 328 10*3/uL (ref 150–400)
RBC: 5.12 MIL/uL (ref 4.22–5.81)
RDW: 14.6 % (ref 11.5–15.5)
WBC: 10.6 10*3/uL — ABNORMAL HIGH (ref 4.0–10.5)
nRBC: 0 % (ref 0.0–0.2)

## 2020-05-27 LAB — RESPIRATORY PANEL BY RT PCR (FLU A&B, COVID)
Influenza A by PCR: NEGATIVE
Influenza B by PCR: NEGATIVE
SARS Coronavirus 2 by RT PCR: NEGATIVE

## 2020-05-27 LAB — ETHANOL: Alcohol, Ethyl (B): <10 mg/dL (ref <10)

## 2020-05-27 LAB — SALICYLATE LEVEL: Salicylate Lvl: <7 mg/dL — ABNORMAL LOW (ref 7.0–30.0)

## 2020-05-27 LAB — ACETAMINOPHEN LEVEL: Acetaminophen (Tylenol), Serum: <10 ug/mL — ABNORMAL LOW (ref 10–30)

## 2020-05-27 MED ORDER — ZIPRASIDONE MESYLATE 20 MG IM SOLR
20.0000 mg | Freq: Once | INTRAMUSCULAR | Status: AC
  Administered 2020-05-27: 20 mg via INTRAMUSCULAR
  Filled 2020-05-27: qty 20

## 2020-05-27 MED ORDER — MIDAZOLAM HCL 2 MG/2ML IJ SOLN
2.0000 mg | Freq: Once | INTRAMUSCULAR | Status: AC
  Administered 2020-05-27: 2 mg via INTRAMUSCULAR
  Filled 2020-05-27: qty 2

## 2020-05-27 MED ORDER — RISPERIDONE 1 MG PO TABS
1.0000 mg | ORAL_TABLET | Freq: Two times a day (BID) | ORAL | Status: DC
  Administered 2020-05-28: 1 mg via ORAL
  Filled 2020-05-27: qty 1

## 2020-05-27 MED ORDER — HALOPERIDOL LACTATE 5 MG/ML IJ SOLN
5.0000 mg | Freq: Once | INTRAMUSCULAR | Status: AC
  Administered 2020-05-27: 5 mg via INTRAMUSCULAR
  Filled 2020-05-27: qty 1

## 2020-05-27 MED ORDER — LITHIUM CARBONATE ER 300 MG PO TBCR
300.0000 mg | EXTENDED_RELEASE_TABLET | Freq: Two times a day (BID) | ORAL | Status: DC
  Administered 2020-05-28: 300 mg via ORAL
  Filled 2020-05-27 (×3): qty 1

## 2020-05-27 MED ORDER — TEMAZEPAM 15 MG PO CAPS: 15.0000 mg | ORAL_CAPSULE | Freq: Every day | ORAL | Status: DC
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
## 2020-05-27 NOTE — ED Notes (Signed)
Hourly rounding reveals patient asleep in room. No complaints, stable, in no acute distress. Q15 minute rounds and monitoring via Rover and Officer to continue.  

## 2020-05-27 NOTE — ED Notes (Signed)
Pt given blanket and lights turned off.
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
## 2020-05-27 NOTE — ED Provider Notes (Signed)
Patient is repeatedly slamming the door violently, just urinated underneath the door as well.  Have ordered IM Geodon for patient and staff safety   Jene Every, MD 05/27/20 1731

## 2020-05-27 NOTE — ED Notes (Signed)
Psych and TTS at bedside. 
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
## 2020-05-27 NOTE — ED Notes (Signed)
Hourly rounding completed at this time, patient currently asleep in room. No complaints, stable, and in no acute distress. Q15 minute rounds and monitoring via Rover and Officer to continue. 

## 2020-05-27 NOTE — ED Notes (Signed)
Pt remains asleep at this time, will assess pt when he wakes up. Will continue to monitor.

## 2020-05-27 NOTE — ED Notes (Addendum)
Pt belongings include gray shirt, one pair camo pants, two shoes, one hat. 1/1 belongings bag.

## 2020-05-27 NOTE — ED Notes (Signed)
Pt urinates on the floor near the door. Pt then starts talking about "that's what women do". Pt not easily redirectable. Pt then just starts slamming door over and over. Pt takes shot voluntarily.

## 2020-05-27 NOTE — BH Assessment (Signed)
Referral information for Psychiatric Hospitalization faxed to;   . [facility] ([phone]-or- [phone]),   . [name] ([phone]---[phone]---[phone]),  . [facility] ([phone], [phone], [phone] or [phone]),   . [facility] ([phone] or [phone])  . [facility] ([phone]),   . [facility] ([phone] -or- [phone]),   . [facility] ([phone]). 

## 2020-05-27 NOTE — ED Notes (Signed)
Report from Maralyn Sago, Charity fundraiser. Patient sleeping, respirations regular and unlabored. Q15 minute rounds and observation by Psychologist, counselling to continue. Will assess and obtain vitals when pt awakens.

## 2020-05-28 MED ORDER — LITHIUM CARBONATE ER 450 MG PO TBCR
450.0000 mg | EXTENDED_RELEASE_TABLET | Freq: Two times a day (BID) | ORAL | Status: DC
  Filled 2020-05-28: qty 1

## 2020-05-28 MED ORDER — RISPERIDONE 1 MG PO TABS: 3.0000 mg | ORAL_TABLET | Freq: Every day | ORAL | Status: DC

## 2020-05-28 MED ORDER — RISPERIDONE MICROSPHERES ER 50 MG IM SRER
50.0000 mg | INTRAMUSCULAR | Status: DC
  Administered 2020-05-28: 50 mg via INTRAMUSCULAR
  Filled 2020-05-28: qty 2

## 2020-05-28 NOTE — ED Provider Notes (Signed)
Emergency Medicine Observation Re-evaluation Note  Ernest Cook is a 29 y.o. male, seen on rounds today.  Pt initially presented to the ED for complaints of Psychiatric Evaluation Currently, the patient is resting.  Physical Exam  BP 109/83   Pulse 79   Temp 99.1 F (37.3 C) (Oral)   Resp 18   Ht 1.753 m (5\' 9" )   Wt 95 kg   SpO2 100%   BMI 30.93 kg/m  Physical Exam  Gen:  No acute distress Resp:  Breathing easily and comfortably, no accessory muscle usage Neuro:  Moving all four extremities, no gross focal neuro deficits Psych:  Resting currently, aggressive earlier but became calm after medication.  ED Course / MDM  EKG:    I have reviewed the labs performed to date as well as medications administered while in observation.  Recent changes in the last 24 hours include episode of aggression and violence that required intramuscular Geodon administration.  Plan  Current plan is for psych assessment and disposition plan. Patient is under full IVC at this time.   , MD 05/28/20 [phone]

## 2020-05-28 NOTE — ED Notes (Signed)
Pt unable to safely take meds due to sedation earlier in day.

## 2020-05-28 NOTE — ED Notes (Signed)
Pt given breakfast tray

## 2020-05-28 NOTE — ED Notes (Signed)
Pt discharged home. VS stable. All belongings returned to patient. Discharge instructions reviewed with patient. Pt denies SI/HI.

## 2020-05-28 NOTE — ED Notes (Signed)
Hourly rounding completed at this time, patient currently asleep in room. No complaints, stable, and in no acute distress. Q15 minute rounds and monitoring via Rover and Officer to continue. 
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
## 2020-05-28 NOTE — ED Provider Notes (Signed)
Patient cleared by psychiatry service for discharge back home.  Patient discharged stable condition.   Gilles Chiquito, MD 05/28/20 1105

## 2020-05-28 NOTE — ED Notes (Signed)
Pt taking a shower at this time 

## 2020-06-23 DIAGNOSIS — Z20822 Contact with and (suspected) exposure to covid-19: Secondary | ICD-10-CM | POA: Diagnosis not present

## 2020-06-23 DIAGNOSIS — F17210 Nicotine dependence, cigarettes, uncomplicated: Secondary | ICD-10-CM | POA: Diagnosis not present

## 2020-06-23 DIAGNOSIS — R440 Auditory hallucinations: Secondary | ICD-10-CM | POA: Diagnosis not present

## 2020-06-23 DIAGNOSIS — R45851 Suicidal ideations: Secondary | ICD-10-CM | POA: Diagnosis not present

## 2020-06-23 DIAGNOSIS — F250 Schizoaffective disorder, bipolar type: Secondary | ICD-10-CM | POA: Diagnosis not present

## 2020-06-24 ENCOUNTER — Other Ambulatory Visit: Payer: Self-pay

## 2020-06-24 ENCOUNTER — Encounter: Payer: Self-pay | Admitting: Emergency Medicine

## 2020-06-24 ENCOUNTER — Emergency Department: Payer: Medicaid Other

## 2020-06-24 ENCOUNTER — Emergency Department
Admission: EM | Admit: 2020-06-24 | Discharge: 2020-06-26 | Disposition: A | Discharge status: Home / Self Care | Payer: Medicaid Other | Attending: Emergency Medicine | Admitting: Emergency Medicine

## 2020-06-24 DIAGNOSIS — S42301A Unspecified fracture of shaft of humerus, right arm, initial encounter for closed fracture: Secondary | ICD-10-CM

## 2020-06-24 DIAGNOSIS — Z91199 Patient's noncompliance with other medical treatment and regimen due to unspecified reason: Secondary | ICD-10-CM

## 2020-06-24 DIAGNOSIS — F250 Schizoaffective disorder, bipolar type: Secondary | ICD-10-CM | POA: Diagnosis present

## 2020-06-24 DIAGNOSIS — F29 Unspecified psychosis not due to a substance or known physiological condition: Secondary | ICD-10-CM

## 2020-06-24 DIAGNOSIS — Z9119 Patient's noncompliance with other medical treatment and regimen: Secondary | ICD-10-CM

## 2020-06-24 DIAGNOSIS — T148XXA Other injury of unspecified body region, initial encounter: Secondary | ICD-10-CM

## 2020-06-24 DIAGNOSIS — F1220 Cannabis dependence, uncomplicated: Secondary | ICD-10-CM | POA: Diagnosis present

## 2020-06-24 DIAGNOSIS — S42351A Displaced comminuted fracture of shaft of humerus, right arm, initial encounter for closed fracture: Secondary | ICD-10-CM

## 2020-06-24 DIAGNOSIS — R45851 Suicidal ideations: Secondary | ICD-10-CM

## 2020-06-24 LAB — COMPREHENSIVE METABOLIC PANEL
ALT: 19 U/L (ref 0–44)
AST: 28 U/L (ref 15–41)
Albumin: 4.5 g/dL (ref 3.5–5.0)
Alkaline Phosphatase: 61 U/L (ref 38–126)
Anion gap: 9 (ref 5–15)
BUN: <5 mg/dL — ABNORMAL LOW (ref 6–20)
CO2: 25 mmol/L (ref 22–32)
Calcium: 9.4 mg/dL (ref 8.9–10.3)
Chloride: 101 mmol/L (ref 98–111)
Creatinine, Ser: 0.87 mg/dL (ref 0.61–1.24)
GFR, Estimated: >60 mL/min (ref >60)
Glucose, Bld: 114 mg/dL — ABNORMAL HIGH (ref 70–99)
Potassium: 3.6 mmol/L (ref 3.5–5.1)
Sodium: 135 mmol/L (ref 135–145)
Total Bilirubin: 0.8 mg/dL (ref 0.3–1.2)
Total Protein: 7.7 g/dL (ref 6.5–8.1)

## 2020-06-24 LAB — CBC
HCT: 44.3 % (ref 39.0–52.0)
Hemoglobin: 14.1 g/dL (ref 13.0–17.0)
MCH: 26.8 pg (ref 26.0–34.0)
MCHC: 31.8 g/dL (ref 30.0–36.0)
MCV: 84.1 fL (ref 80.0–100.0)
Platelets: 355 10*3/uL (ref 150–400)
RBC: 5.27 MIL/uL (ref 4.22–5.81)
RDW: 14.4 % (ref 11.5–15.5)
WBC: 12.7 10*3/uL — ABNORMAL HIGH (ref 4.0–10.5)
nRBC: 0 % (ref 0.0–0.2)

## 2020-06-24 LAB — ACETAMINOPHEN LEVEL: Acetaminophen (Tylenol), Serum: <10 ug/mL — ABNORMAL LOW (ref 10–30)

## 2020-06-24 LAB — URINE DRUG SCREEN, QUALITATIVE (ARMC ONLY)
Amphetamines, Ur Screen: NOT DETECTED
Barbiturates, Ur Screen: NOT DETECTED
Benzodiazepine, Ur Scrn: NOT DETECTED
Cannabinoid 50 Ng, Ur ~~LOC~~: NOT DETECTED
Cocaine Metabolite,Ur ~~LOC~~: NOT DETECTED
MDMA (Ecstasy)Ur Screen: NOT DETECTED
Methadone Scn, Ur: NOT DETECTED
Opiate, Ur Screen: NOT DETECTED
Phencyclidine (PCP) Ur S: NOT DETECTED
Tricyclic, Ur Screen: NOT DETECTED

## 2020-06-24 LAB — RESPIRATORY PANEL BY RT PCR (FLU A&B, COVID)
Influenza A by PCR: NEGATIVE
Influenza B by PCR: NEGATIVE
SARS Coronavirus 2 by RT PCR: NEGATIVE

## 2020-06-24 LAB — LITHIUM LEVEL: Lithium Lvl: 0.23 mmol/L — ABNORMAL LOW (ref 0.60–1.20)

## 2020-06-24 LAB — ETHANOL: Alcohol, Ethyl (B): <10 mg/dL (ref <10)

## 2020-06-24 LAB — SALICYLATE LEVEL: Salicylate Lvl: <7 mg/dL — ABNORMAL LOW (ref 7.0–30.0)

## 2020-06-24 MED ORDER — ATOMOXETINE HCL 10 MG PO CAPS
40.0000 mg | ORAL_CAPSULE | Freq: Every morning | ORAL | Status: DC
  Administered 2020-06-24 – 2020-06-25 (×2): 40 mg via ORAL
  Filled 2020-06-24 (×3): qty 4

## 2020-06-24 MED ORDER — RISPERIDONE 1 MG PO TABS
3.0000 mg | ORAL_TABLET | Freq: Every day | ORAL | Status: DC
  Administered 2020-06-24 – 2020-06-25 (×2): 3 mg via ORAL
  Filled 2020-06-24 (×2): qty 3

## 2020-06-24 MED ORDER — LITHIUM CARBONATE 300 MG PO CAPS
300.0000 mg | ORAL_CAPSULE | Freq: Every day | ORAL | Status: DC
  Administered 2020-06-24: 300 mg via ORAL
  Filled 2020-06-24 (×2): qty 1

## 2020-06-24 MED ORDER — BENZTROPINE MESYLATE 1 MG PO TABS
1.0000 mg | ORAL_TABLET | Freq: Every day | ORAL | Status: DC
  Administered 2020-06-24 – 2020-06-25 (×2): 1 mg via ORAL
  Filled 2020-06-24 (×2): qty 1

## 2020-06-24 MED ORDER — LORAZEPAM 2 MG/ML IJ SOLN
INTRAMUSCULAR | Status: AC
  Filled 2020-06-24: qty 1
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
## 2020-06-24 NOTE — ED Notes (Signed)
Soc monitor placed in pt room

## 2020-06-24 NOTE — ED Notes (Signed)
Patient currently resting with eyes closed no distress noted at this time. Will continue to monitor.

## 2020-06-24 NOTE — ED Notes (Signed)
Report to include Situation, Background, Assessment, and Recommendations received from Melina Copa. Patient alert and oriented, warm and dry, in no acute distress. Patient denies SI, HI, AVH. Patient made aware of Q15 minute rounds and Psychologist, counselling presence for their safety. Patient instructed to come to me with needs or concerns.

## 2020-06-24 NOTE — ED Notes (Signed)
Hourly rounding reveals patient in room. No complaints, stable, in no acute distress. Q15 minute rounds and monitoring via Rover and Officer to continue.   

## 2020-06-24 NOTE — ED Notes (Signed)
Pt awake asking for urinal. When ED tech and this RN assisting pt with urinal, bed and pt scrubs noted to be soiled with urine. Bedding and clothing changes and cleaned. Clean scrubs placed on pt. Pt cooperative when staff providing care at this time.

## 2020-06-24 NOTE — BH Assessment (Signed)
Attempted to assess patient with TTS, patient is currently unable to participate in assessment at this time.

## 2020-06-24 NOTE — ED Notes (Signed)
Breakfast given to pt

## 2020-06-24 NOTE — ED Notes (Signed)
Pt talking to [facility]

## 2020-06-24 NOTE — BH Assessment (Signed)
Per [facility] pt meets criteria for INPT.

## 2020-06-24 NOTE — BH Assessment (Signed)
No beds currently available on [facility] due to staffing issues as 11/07 at 9am, TTS will continue to follow-up with staffing/acuity of unit.  TTS made contact with [facility] Hilda Lias) who reports no bed availability at this time.   Referral information for Psychiatric Hospitalization faxed to:   [name] ([phone]- [phone]),    [address] [phone]),    [name] [phone] -or- [phone]),    [facility] [phone] -[phone]) [phone]   [facility] [phone] or [phone])   [facility] [phone] or [phone])   [facility] [phone] or [phone]),

## 2020-06-24 NOTE — ED Notes (Signed)
Report given to Dr. Jonelle Sidle for [facility] at this time
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
## 2020-06-24 NOTE — ED Triage Notes (Signed)
Pt comes under IVC with [place] Sheriff Duputy.   Per IVC pt has made SI statememnts about walking into traffic, pt denies this and reports that he was looking for his mother, pt appears nervous and agitates easily with hyperventilation.  Pt oriented to process and appears to calm until next realization of be "back in this hospital - I want to go home"   Pt dressed of in triage, one bag belonging inventoried.  Pt mostly calm and cooperative during triage, verbal and motor slowed or restricted.  Pt reports "I'm taking all my medicine"

## 2020-06-24 NOTE — ED Notes (Signed)
Pt given lunch tray.

## 2020-06-24 NOTE — ED Notes (Signed)
Snack and beverage given. 

## 2020-06-24 NOTE — ED Notes (Signed)
Called [facility] for consult  [phone]

## 2020-06-24 NOTE — BH Assessment (Signed)
Attempted to assess patient with Psyc NP, patient is currently unable to participate in assessment at this time.
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
## 2020-06-24 NOTE — ED Notes (Signed)
Mom and ACT team updated
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
## 2020-06-25 DIAGNOSIS — S42301A Unspecified fracture of shaft of humerus, right arm, initial encounter for closed fracture: Secondary | ICD-10-CM

## 2020-06-25 DIAGNOSIS — F250 Schizoaffective disorder, bipolar type: Secondary | ICD-10-CM

## 2020-06-25 MED ORDER — LITHIUM CARBONATE 300 MG PO CAPS
450.0000 mg | ORAL_CAPSULE | Freq: Two times a day (BID) | ORAL | Status: DC
  Administered 2020-06-25: 450 mg via ORAL
  Filled 2020-06-25 (×3): qty 1

## 2020-06-25 NOTE — ED Notes (Signed)
Gave food tray with sprite. 

## 2020-06-25 NOTE — Consult Note (Signed)
[facility] ([place]) Face-to-Face Psychiatry Consult   Reason for Consult: Consult for 29 year old man with a history of perennial or schizoaffective disorder brought into the hospital agitated and psychotic Referring Physician: Marcello Moores Patient Identification: Ernest Cook MRN:  [id number] Principal Diagnosis: Schizoaffective disorder, bipolar type (HCC) Diagnosis:  Principal Problem:   Schizoaffective disorder, bipolar type (HCC) Active Problems:   Cannabis use disorder, moderate, dependence (HCC)   Noncompliance   Broken arm, right, closed, initial encounter   Total Time spent with patient: 1 hour  Subjective:   Ernest Cook is a 29 y.o. male patient admitted with "there is just things at my house".  HPI: Patient seen chart reviewed. 29 year old man with a history of schizoaffective disorder came into the emergency room over the weekend. Agitated at the time he attempted to elope required physical restraints sustained unfortunately a fracture to his right arm. On interview today the patient is still disorganized and paranoid. Talks about how there are people breaking into his house and trying to harm him outside the hospital. Rambling story does not hold together make much sense. Patient admits he has been noncompliant with his medicine. Admits to cannabis use.  Past Psychiatric History: Past history of schizoaffective disorder and at times in the past have been quite stable on injectable medicine but recently has decompensated with multiple hospitalizations or visits to the emergency room. He has an act team. No past suicide attempts but there is a past history of aggression when psychotic  Risk to Self: Suicidal Ideation: No Suicidal Intent: No Is patient at risk for suicide?: No Suicidal Plan?: No Access to Means: No What has been your use of drugs/alcohol within the last 12 months?: none reported  How many times?: 0 Other Self Harm Risks: None reported  Triggers for Past Attempts: None  known Intentional Self Injurious Behavior: None Risk to Others: Homicidal Ideation: No Thoughts of Harm to Others: No Current Homicidal Intent: No Current Homicidal Plan: No Access to Homicidal Means: No Identified Victim: n/a History of harm to others?: No Assessment of Violence: None Noted Violent Behavior Description: n/a Does patient have access to weapons?: No Criminal Charges Pending?: No Does patient have a court date: No Prior Inpatient Therapy: Prior Inpatient Therapy: Yes Prior Therapy Dates: 03/2020 & 03/2016 Prior Therapy Facilty/Provider(s): [facility] Reason for Treatment: Schizophrenia Prior Outpatient Therapy: Prior Outpatient Therapy: Yes Prior Therapy Dates: Current Prior Therapy Facilty/Provider(s): [facility] ACT team Reason for Treatment: Schizophrenia Does patient have an ACCT team?: Yes Does patient have Intensive In-House Services?  : No Does patient have [facility] services? : No Does patient have P4CC services?: No  Past Medical History:  Past Medical History:  Diagnosis Date  . Schizo affective schizophrenia [facility])     Past Surgical History:  Procedure Laterality Date  . SKIN GRAFT Left unknown   Pt reports left foot and leg skin graft   Family History: History reviewed. No pertinent family history. Family Psychiatric  History: None reported Social History:  Social History   Substance and Sexual Activity  Alcohol Use No     Social History   Substance and Sexual Activity  Drug Use Yes  . Types: Marijuana   Comment: "every now and again"    Social History   Socioeconomic History  . Marital status: Single    Spouse name: Not on file  . Number of children: Not on file  . Years of education: Not on file  . Highest education level: Not on file  Occupational  History  . Not on file  Tobacco Use  . Smoking status: Current Every Day Smoker    Packs/day: 0.25    Types: Cigarettes  . Smokeless tobacco: Never Used  Substance and Sexual  Activity  . Alcohol use: No  . Drug use: Yes    Types: Marijuana    Comment: "every now and again"  . Sexual activity: Yes    Birth control/protection: Condom    Comment: Pt reports he is attracted to men  Other Topics Concern  . Not on file  Social History Narrative  . Not on file   Social Determinants of Health   Financial Resource Strain:   . Difficulty of Paying Living Expenses: Not on file  Food Insecurity:   . Worried About Programme researcher, broadcasting/film/video in the Last Year: Not on file  . Ran Out of Food in the Last Year: Not on file  Transportation Needs:   . Lack of Transportation (Medical): Not on file  . Lack of Transportation (Non-Medical): Not on file  Physical Activity:   . Days of Exercise per Week: Not on file  . Minutes of Exercise per Session: Not on file  Stress:   . Feeling of Stress : Not on file  Social Connections:   . Frequency of Communication with Friends and Family: Not on file  . Frequency of Social Gatherings with Friends and Family: Not on file  . Attends Religious Services: Not on file  . Active Member of Clubs or Organizations: Not on file  . Attends Banker Meetings: Not on file  . Marital Status: Not on file   Additional Social History:    Allergies:  No Known Allergies  Labs:  Results for orders placed or performed during the hospital encounter of 06/24/20 (from the past 48 hour(s))  Comprehensive metabolic panel     Status: Abnormal   Collection Time: 06/24/20  1:12 AM  Result Value Ref Range   Sodium 135 135 - 145 mmol/L   Potassium 3.6 3.5 - 5.1 mmol/L   Chloride 101 98 - 111 mmol/L   CO2 25 22 - 32 mmol/L   Glucose, Bld 114 (H) 70 - 99 mg/dL    Comment: Glucose reference range applies only to samples taken after fasting for at least 8 hours.   BUN <5 (L) 6 - 20 mg/dL   Creatinine, Ser 8.41 0.61 - 1.24 mg/dL   Calcium 9.4 8.9 - 32.4 mg/dL   Total Protein 7.7 6.5 - 8.1 g/dL   Albumin 4.5 3.5 - 5.0 g/dL   AST 28 15 - 41 U/L    ALT 19 0 - 44 U/L   Alkaline Phosphatase 61 38 - 126 U/L   Total Bilirubin 0.8 0.3 - 1.2 mg/dL   GFR, Estimated >40 >10 mL/min    Comment: (NOTE) Calculated using the CKD-EPI Creatinine Equation (2021)    Anion gap 9 5 - 15    Comment: Performed at [facility], [address] [name], [place] [address]  Ethanol     Status: None   Collection Time: 06/24/20  1:12 AM  Result Value Ref Range   Alcohol, Ethyl (B) <10 <10 mg/dL    Comment: (NOTE) Lowest detectable limit for serum alcohol is 10 mg/dL.  For medical purposes only. Performed at [facility], [address], [place], [place] [address]   Salicylate level     Status: Abnormal   Collection Time: 06/24/20  1:12 AM  Result Value Ref  Range   Salicylate Lvl <7.0 (L) 7.0 - 30.0 mg/dL    Comment: Performed at [facility], [address] [place], [place] [address]  Acetaminophen level     Status: Abnormal   Collection Time: 06/24/20  1:12 AM  Result Value Ref Range   Acetaminophen (Tylenol), Serum <10 (L) 10 - 30 ug/mL    Comment: (NOTE) Therapeutic concentrations vary significantly. A range of 10-30 ug/mL  may be an effective concentration for many patients. However, some  are best treated at concentrations outside of this range. Acetaminophen concentrations >150 ug/mL at 4 hours after ingestion  and >50 ug/mL at 12 hours after ingestion are often associated with  toxic reactions.  Performed at [facility], [address] [place], [place] [address]   cbc     Status: Abnormal   Collection Time: 06/24/20  1:12 AM  Result Value Ref Range   WBC 12.7 (H) 4.0 - 10.5 K/uL   RBC 5.27 4.22 - 5.81 MIL/uL   Hemoglobin 14.1 13.0 - 17.0 g/dL   HCT 29.5 39 - 52 %   MCV 84.1 80.0 - 100.0 fL   MCH 26.8 26.0 - 34.0 pg   MCHC 31.8 30.0 - 36.0 g/dL   RDW 28.4 13.2 - 44.0 %   Platelets 355 150 - 400 K/uL   nRBC 0.0 0.0 - 0.2 %    Comment: Performed at [facility], [address] [place], [place] [address]  Lithium level     Status: Abnormal   Collection Time: 06/24/20  1:12 AM  Result Value Ref Range   Lithium Lvl 0.23 (L) 0.60 - 1.20 mmol/L    Comment: Performed at [facility], [address] [place], [place] [address]  Respiratory Panel by RT PCR (Flu A&B, Covid) - Nasopharyngeal Swab     Status: None   Collection Time: 06/24/20  4:04 AM   Specimen: Nasopharyngeal Swab  Result Value Ref Range   SARS Coronavirus 2 by RT PCR NEGATIVE NEGATIVE    Comment: (NOTE) SARS-CoV-2 target nucleic acids are NOT DETECTED.  The SARS-CoV-2 RNA is generally detectable in upper respiratoy specimens during the acute phase of infection. The lowest concentration of SARS-CoV-2 viral copies this assay can detect is 131 copies/mL. A negative result does not preclude SARS-Cov-2 infection and should not be used as the sole basis for treatment or other patient management decisions. A negative result may occur with  improper specimen collection/handling, submission of specimen other than nasopharyngeal swab, presence of viral mutation(s) within the areas targeted by this assay, and inadequate number of viral copies (<131 copies/mL). A negative result must be combined with clinical observations, patient history, and epidemiological information. The expected result is Negative.  Fact Sheet for Patients:  https://www.moore.com/  Fact Sheet for Healthcare Providers:  https://www.young.biz/  This test is no t yet approved or cleared by the [place] FDA and  has been authorized for detection and/or diagnosis of SARS-CoV-2 by FDA under an Emergency Use Authorization (EUA). This EUA will remain  in effect (meaning this test can be used) for the duration of the COVID-19 declaration under Section 564(b)(1) of the Act, 21 U.S.C. section 360bbb-3(b)(1), unless the authorization is terminated or revoked sooner.     Influenza A by  PCR NEGATIVE NEGATIVE   Influenza B by PCR NEGATIVE NEGATIVE    Comment: (NOTE) The Xpert Xpress SARS-CoV-2/FLU/RSV assay is intended as an aid in  the diagnosis of influenza from Nasopharyngeal swab specimens  and  should not be used as a sole basis for treatment. Nasal washings and  aspirates are unacceptable for Xpert Xpress SARS-CoV-2/FLU/RSV  testing.  Fact Sheet for Patients: https://www.moore.com/  Fact Sheet for Healthcare Providers: https://www.young.biz/  This test is not yet approved or cleared by the [place] FDA and  has been authorized for detection and/or diagnosis of SARS-CoV-2 by  FDA under an Emergency Use Authorization (EUA). This EUA will remain  in effect (meaning this test can be used) for the duration of the  Covid-19 declaration under Section 564(b)(1) of the Act, 21  U.S.C. section 360bbb-3(b)(1), unless the authorization is  terminated or revoked. Performed at [facility], [address] [place], [place] [address]   Urine Drug Screen, Qualitative     Status: None   Collection Time: 06/24/20  9:01 AM  Result Value Ref Range   Tricyclic, Ur Screen NONE DETECTED NONE DETECTED   Amphetamines, Ur Screen NONE DETECTED NONE DETECTED   MDMA (Ecstasy)Ur Screen NONE DETECTED NONE DETECTED   Cocaine Metabolite,Ur [place] NONE DETECTED NONE DETECTED   Opiate, Ur Screen NONE DETECTED NONE DETECTED   Phencyclidine (PCP) Ur S NONE DETECTED NONE DETECTED   Cannabinoid 50 Ng, Ur [place] NONE DETECTED NONE DETECTED   Barbiturates, Ur Screen NONE DETECTED NONE DETECTED   Benzodiazepine, Ur Scrn NONE DETECTED NONE DETECTED   Methadone Scn, Ur NONE DETECTED NONE DETECTED    Comment: (NOTE) Tricyclics + metabolites, urine    Cutoff 1000 ng/mL Amphetamines + metabolites, urine  Cutoff 1000 ng/mL MDMA (Ecstasy), urine              Cutoff 500 ng/mL Cocaine Metabolite, urine          Cutoff 300 ng/mL Opiate + metabolites, urine         Cutoff 300 ng/mL Phencyclidine (PCP), urine         Cutoff 25 ng/mL Cannabinoid, urine                 Cutoff 50 ng/mL Barbiturates + metabolites, urine  Cutoff 200 ng/mL Benzodiazepine, urine              Cutoff 200 ng/mL Methadone, urine                   Cutoff 300 ng/mL  The urine drug screen provides only a preliminary, unconfirmed analytical test result and should not be used for non-medical purposes. Clinical consideration and professional judgment should be applied to any positive drug screen result due to possible interfering substances. A more specific alternate chemical method must be used in order to obtain a confirmed analytical result. Gas chromatography / mass spectrometry (GC/MS) is the preferred confirm atory method. Performed at [facility], [address], [place], [place] [address]     Current Facility-Administered Medications  Medication Dose Route Frequency Provider Last Rate Last Admin  . atomoxetine (STRATTERA) capsule 40 mg  40 mg Oral q morning - 10a Shaune Pollack, MD   40 mg at 06/25/20 0923  . benztropine (COGENTIN) tablet 1 mg  1 mg Oral QHS Shaune Pollack, MD   1 mg at 06/24/20 2116  . lithium carbonate capsule 450 mg  450 mg Oral BID WC Dima Mini T, MD      . risperiDONE (RISPERDAL) tablet 3 mg  3 mg Oral QHS Shaune Pollack, MD   3 mg at 06/24/20 2116   Current Outpatient Medications  Medication Sig Dispense Refill  . atomoxetine (STRATTERA)  40 MG capsule Take 40 mg by mouth every morning.     . benztropine (COGENTIN) 1 MG tablet Take 1 mg by mouth at bedtime.     Marland Kitchen lithium carbonate 300 MG capsule Take 300 mg by mouth at bedtime.     . risperiDONE (RISPERDAL) 3 MG tablet Take 1 tablet (3 mg total) by mouth at bedtime. 30 tablet 1  . risperiDONE microspheres (RISPERDAL CONSTA) 50 MG injection Inject 50 mg into the muscle every 14 (fourteen) days.      Musculoskeletal: Strength & Muscle Tone: within normal limits Gait &  Station: normal Patient leans: N/A  Psychiatric Specialty Exam: Physical Exam Vitals and nursing note reviewed.  Constitutional:      Appearance: He is well-developed.  HENT:     Head: Normocephalic and atraumatic.  Eyes:     Conjunctiva/sclera: Conjunctivae normal.     Pupils: Pupils are equal, round, and reactive to light.  Cardiovascular:     Heart sounds: Normal heart sounds.  Pulmonary:     Effort: Pulmonary effort is normal.  Abdominal:     Palpations: Abdomen is soft.  Musculoskeletal:        General: Normal range of motion.     Cervical back: Normal range of motion.     Comments: Right arm is in a cast from a acute fracture of the right humerus  Skin:    General: Skin is warm and dry.  Neurological:     Mental Status: He is alert.  Psychiatric:        Attention and Perception: He is inattentive.        Mood and Affect: Mood is anxious. Affect is labile.        Speech: Speech is delayed.        Thought Content: Thought content is paranoid and delusional. Thought content does not include homicidal or suicidal ideation.        Cognition and Memory: Cognition is impaired. Memory is impaired.        Judgment: Judgment is impulsive and inappropriate.     Review of Systems  Constitutional: Negative.   HENT: Negative.   Eyes: Negative.   Respiratory: Negative.   Cardiovascular: Negative.   Gastrointestinal: Negative.   Musculoskeletal: Negative.        Mild to moderate pain in the right arm where he had a fracture  Skin: Negative.   Neurological: Negative.   Psychiatric/Behavioral: Positive for behavioral problems, confusion and sleep disturbance. The patient is hyperactive.     Blood pressure 110/68, pulse 84, temperature 98.5 F (36.9 C), temperature source Oral, resp. rate 20, height 5\' 9"  (1.753 m), weight 86.2 kg, SpO2 100 %.Body mass index is 28.06 kg/m.  General Appearance: Casual  Eye Contact:  Fair  Speech:  Garbled  Volume:  Decreased  Mood:  Anxious   Affect:  Constricted and Inappropriate  Thought Process:  Disorganized  Orientation:  Negative  Thought Content:  Illogical, Delusions and Paranoid Ideation  Suicidal Thoughts:  No  Homicidal Thoughts:  No  Memory:  Immediate;   Fair Recent;   Poor Remote;   Poor  Judgement:  Impaired  Insight:  Shallow  Psychomotor Activity:  Decreased  Concentration:  Concentration: Poor  Recall:  of Knowledge:  Fair  Language:  Fair  Akathisia:  No  Handed:  Right  AIMS (if indicated):     Assets:  Housing Resilience Social Support  ADL's:  Impaired  Cognition:  Impaired,  Mild  Sleep:        Treatment Plan Summary: Daily contact with patient to assess and evaluate symptoms and progress in treatment, Medication management and Plan Patient with schizoaffective disorder was agitated aggressive dangerous in his behavior as recently as yesterday. Still disorganized in his thinking today. Requires inpatient treatment for safety. Continue under IVC. Orders in place for his previous dose of lithium and Risperdal. Working on finding appropriate bed availability. Case reviewed with emergency room doctor and TTS  Disposition: Recommend psychiatric Inpatient admission when medically cleared. Supportive therapy provided about ongoing stressors. Discussed crisis plan, support from social network, calling 911, coming to the Emergency Department, and calling Suicide Hotline.  Mordecai RasmussenJohn Cyd Hostler, MD 06/25/2020 11:11 AM

## 2020-06-25 NOTE — BH Assessment (Signed)
Per [facility]) pt is currently under review.

## 2020-06-25 NOTE — BH Assessment (Addendum)
No beds currently available on [facility] due to staffing issues as 11/08, TTS will continue to follow-up with staffing/acuity of unit.  TTS made contact with [facility]) who reports no bed availability at this time.   Referral check:   . [name] [phone]),  [place] request refax; task completed [address] [phone]),   No answer              [facility]vet[facility] Coder [phone] -or   [phone]),  [place] request refax; task  completed 4pm              [facility] [phone] -or-            [phone], [phone]),   Corrie Dandy reports to only accept adolescent and Rolena Infante  pt's             [facility] [phone] or [phone])   Left a  voicemail             [facility] [phone] or [phone])   [facility]  reports to only accept adolescent and  Rolena Infante pt's              [facility] Kitty [phone] or [phone]),   [facility] Pen reports to only accept pt's 55 and over

## 2020-06-25 NOTE — ED Notes (Signed)
Called [facility] for [facility] spoke to [facility]  [phone]

## 2020-06-25 NOTE — ED Notes (Signed)
Hourly rounding reveals patient in room. No complaints, stable, in no acute distress. Q15 minute rounds and monitoring via Rover and Officer to continue.   

## 2020-06-25 NOTE — ED Notes (Signed)
IVC pending placement 

## 2020-06-25 NOTE — BH Assessment (Signed)
TTS was contacted by Nehemiah Settle [facility]) who reports to have no appropriate bed available for pt at this time.  TTS will follow up with referral check accordingly.

## 2020-06-25 NOTE — ED Notes (Signed)
Pt given breakfast tray

## 2020-06-25 NOTE — Consult Note (Signed)
  Brief follow-up for this 29 year old man with schizoaffective disorder.  The fracture of his right humerus is evidently recommended to be treated at a level 1 trauma center.  Reviewed plans with the emergency room physician.  We do not yet have an open psychiatric bed available for this patient so it will be tomorrow at the earliest that we would be able to look at psychiatric admission.  We will try and find a way to facilitate both getting his arm treated and his mental health needs met at that time.  Patient does have the capacity to give consent for the surgery to repair his broken arm.  He clearly understands that his arm is broken and wants to have it repaired correctly

## 2020-06-25 NOTE — ED Notes (Signed)
IVC, pend Ortho admit/transfer to [facility]

## 2020-06-25 NOTE — ED Provider Notes (Addendum)
Patient apparently scheduled to be transferred to an inpatient psychiatric facility in the next few days. They will not accept him with a splint on his arm as it could be used as a weapon. Patient will not be able to go outpatient to Dr. Luz Brazen to be fixed later. I am trying to get the patient to have surgery sooner. Discussed the patient with Dr. Allena Katz who looked at the x-rays when he came in and said nobody here would be willing to do that. Dr. Rosita Kea then walked by and said the same thing but said Dr. Josephine Igo may want to do it. I have been unable to reach Dr. Trinda Pascal tonight. He is not on-call. I did talk to Dr. Marcello Fennel at [facility] who is more than willing to do the surgery. He has space tomorrow. His concern is that there is no ability to do inpatient consults he says. I am currently attempting to contact behavioral health Hospital to see if they could support Dr. Marcello Fennel if they needed to if the patient became psychotic either before or after surgery. I have also talked to [facility]. [facility] is more than happy to do the surgery but will not accept him as his main problem is psychiatric and this is normally an outpatient type of procedure per the surgeon at [facility]. I will continue to work on this in between seeing my other patients with chest pain etc.   Ernest Natal, MD 06/25/20 1800 Discussed patient with [facility].  Talk to both psychiatrist on call tonight.  The one said we can call triage and they can set it up and the other one said that she will talk to Dr. Lucianne Muss in the morning and make sure that is available as inpatient psychiatry consult is available at [facility].  Dr. Mat Carne packs here in [place] also will be able to talk tomorrow to [facility] psychiatry and make sure everything is working.  It does not appear that I can safely and reliably transfer this person tonight.  I will try 1 more thing when I get a chance because I am now got 3 patients backed up 1 with chest pain again and 2 other psychiatric  patients and I am still working on another hypotensive patient however I will try and see if I can talk to the hospitalist a medical hospitalist at [facility] and see what they know.  ----------------------------------------- 8:15 PM on 06/25/2020 -----------------------------------------  Discussed patient with internal medicine at [facility].  I went over the patient's history read from the chart.  I reexamined the patient he is more awake now than he was earlier and he can make the okay sign and keep his fingers together against resistance and spread his fingers apart and give me a thumbs up and has completely normal sensation throughout his whole hand normal capillary refill I can barely reach his pulse but that feels normal as well.  His hand is warm.  The strength in the right hand is the same as a strength in the left hand which is uninjured.  Patient's not having any other problems.  He is not complaining of headache.  I think it hurts in his his arm where the fracture is in its not severe pain at this point.  Patient is excepted by medicine Dr Leafy Half, he will go to telemetry as he may need more QT prolonging medicines.  Dr. Carola Frost has previously said he has room in his OR schedule tomorrow would  be happy to do the surgery.  The only remaining complication is at [facility] has no beds and this sheriff may not be available to transport this gentleman to [facility] right now.  The patient is of course on a waiting list.  Perhaps he can go directly to surgery in the morning.  He has been accepted by everybody necessary to accept him he just needs a bed somewhere.   Ernest Natal, MD 06/25/20 2021

## 2020-06-26 ENCOUNTER — Encounter (HOSPITAL_COMMUNITY): Payer: Self-pay | Admitting: Internal Medicine

## 2020-06-26 ENCOUNTER — Inpatient Hospital Stay (HOSPITAL_COMMUNITY)
Admission: AD | Admit: 2020-06-26 | Discharge: 2020-06-28 | DRG: 493 | Disposition: A | Discharge status: Home / Self Care | Payer: Medicaid Other | Source: Other Acute Inpatient Hospital | Attending: Internal Medicine | Admitting: Internal Medicine

## 2020-06-26 DIAGNOSIS — Z880 Allergy status to penicillin: Secondary | ICD-10-CM

## 2020-06-26 DIAGNOSIS — S42351A Displaced comminuted fracture of shaft of humerus, right arm, initial encounter for closed fracture: Secondary | ICD-10-CM | POA: Diagnosis present

## 2020-06-26 DIAGNOSIS — S42309A Unspecified fracture of shaft of humerus, unspecified arm, initial encounter for closed fracture: Secondary | ICD-10-CM

## 2020-06-26 DIAGNOSIS — F250 Schizoaffective disorder, bipolar type: Secondary | ICD-10-CM | POA: Diagnosis present

## 2020-06-26 DIAGNOSIS — Z79899 Other long term (current) drug therapy: Secondary | ICD-10-CM | POA: Diagnosis not present

## 2020-06-26 DIAGNOSIS — F17210 Nicotine dependence, cigarettes, uncomplicated: Secondary | ICD-10-CM | POA: Diagnosis present

## 2020-06-26 DIAGNOSIS — Z419 Encounter for procedure for purposes other than remedying health state, unspecified: Secondary | ICD-10-CM

## 2020-06-26 DIAGNOSIS — X58XXXA Exposure to other specified factors, initial encounter: Secondary | ICD-10-CM | POA: Diagnosis present

## 2020-06-26 DIAGNOSIS — D62 Acute posthemorrhagic anemia: Secondary | ICD-10-CM | POA: Diagnosis not present

## 2020-06-26 MED ORDER — ATOMOXETINE HCL 40 MG PO CAPS
40.0000 mg | ORAL_CAPSULE | Freq: Every morning | ORAL | Status: DC
  Administered 2020-06-26 – 2020-06-28 (×2): 40 mg via ORAL
  Filled 2020-06-26 (×3): qty 1

## 2020-06-26 MED ORDER — ACETAMINOPHEN 650 MG RE SUPP: 650.0000 mg | Freq: Four times a day (QID) | RECTAL | Status: DC | PRN

## 2020-06-26 MED ORDER — LITHIUM CARBONATE 300 MG PO CAPS
450.0000 mg | ORAL_CAPSULE | Freq: Two times a day (BID) | ORAL | Status: DC
  Administered 2020-06-26 – 2020-06-28 (×3): 450 mg via ORAL
  Filled 2020-06-26 (×7): qty 1

## 2020-06-26 MED ORDER — BENZTROPINE MESYLATE 1 MG PO TABS
1.0000 mg | ORAL_TABLET | Freq: Every day | ORAL | Status: DC
  Administered 2020-06-26 – 2020-06-27 (×2): 1 mg via ORAL
  Filled 2020-06-26 (×3): qty 1

## 2020-06-26 MED ORDER — SODIUM CHLORIDE 0.9% FLUSH
3.0000 mL | Freq: Two times a day (BID) | INTRAVENOUS | Status: DC
  Administered 2020-06-28: 3 mL via INTRAVENOUS

## 2020-06-26 MED ORDER — RISPERIDONE 3 MG PO TABS
3.0000 mg | ORAL_TABLET | Freq: Every day | ORAL | Status: DC
  Administered 2020-06-26 – 2020-06-27 (×2): 3 mg via ORAL
  Filled 2020-06-26 (×3): qty 1

## 2020-06-26 MED ORDER — ACETAMINOPHEN 325 MG PO TABS: 650.0000 mg | ORAL_TABLET | Freq: Four times a day (QID) | ORAL | Status: DC | PRN

## 2020-06-26 MED ORDER — ENOXAPARIN SODIUM 40 MG/0.4ML ~~LOC~~ SOLN: 40.0000 mg | Freq: Every day | SUBCUTANEOUS | Status: DC

## 2020-06-26 MED ORDER — LITHIUM CARBONATE 300 MG PO CAPS
300.0000 mg | ORAL_CAPSULE | Freq: Every day | ORAL | Status: DC
  Administered 2020-06-26 – 2020-06-27 (×2): 300 mg via ORAL
  Filled 2020-06-26 (×3): qty 1

## 2020-06-26 MED ORDER — LACTATED RINGERS IV SOLN: INTRAVENOUS | Status: DC

## 2020-06-26 MED ORDER — MORPHINE SULFATE (PF) 2 MG/ML IV SOLN: 2.0000 mg | INTRAVENOUS | Status: DC | PRN

## 2020-06-26 NOTE — Plan of Care (Signed)
  Problem: Clinical Measurements: Goal: Ability to maintain clinical measurements within normal limits will improve Outcome: Progressing   Problem: Coping: Goal: Level of anxiety will decrease Outcome: Progressing   Problem: Pain Managment: Goal: General experience of comfort will improve Outcome: Progressing   Problem: Safety: Goal: Ability to remain free from injury will improve Outcome: Progressing   
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
## 2020-06-26 NOTE — Assessment & Plan Note (Addendum)
-   continue IVC as per previous facility - psych consulted while in hospital for any needed med adjustment - continue previous regimen: lithium, strattera, cogentin, risperdal - monitor on tele for now; can d/c if no events ~24 hours. QTc 405. NSR. LVH seems present/borderline per voltage criteria (may need outpatient echo)

## 2020-06-26 NOTE — ED Provider Notes (Addendum)
Emergency Medicine Observation Re-evaluation Note  Jonus LAURI TILL is a 29 y.o. male, seen on rounds today.  Pt initially presented to the ED for complaints of Altered Mental Status Currently, the patient is resting.  Physical Exam  BP 125/80 (BP Location: Left Arm)   Pulse 100   Temp 98.1 F (36.7 C) (Oral)   Resp 18   Ht 1.753 m (5\' 9" )   Wt 86.2 kg   SpO2 100%   BMI 28.06 kg/m  Physical Exam Gen:  No acute distress Resp:  Breathing easily and comfortably, no accessory muscle usage MSK:  Splint in place on right arm, no acute changes. Patient reports minimal pain, worse with movement. Compartments above and below splint are soft and easily compressible. Able to move fingers and grip appropriately. No concern for compartment syndrome. Neuro:  no gross focal neuro deficits Psych:  Resting quietly, polite and calm at this time.  Answers questions appropriately, acknowledges understanding of plan to transfer to for additional orthopedics and psychiatric care.  ED Course / MDM  EKG:    I have reviewed the labs performed to date as well as medications administered while in observation.  Recent changes in the last 24 hours include plan for transfer to [facility]. Please see the notes by Dr. [facility] for additional detail.  Plan  Current plan is for transfer to [facility] to the hospitalist service when a bed is available, followed as soon as possible by orthopedic surgery.  Patient is under full IVC at this time.   [facility], MD 06/26/20 13/09/21    [id number], MD 06/26/20 [phone]
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
## 2020-06-26 NOTE — ED Notes (Addendum)
After talking with Dr. Shauna Hugh, orders he placed at 05:19 can be "carried out on arrival." Including IVF, IV and blood work. Pt to be transferred to [facility] when a bed is available.
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
## 2020-06-27 ENCOUNTER — Encounter (HOSPITAL_COMMUNITY): Payer: Self-pay | Admitting: Internal Medicine

## 2020-06-27 ENCOUNTER — Inpatient Hospital Stay (HOSPITAL_COMMUNITY): Payer: Medicaid Other

## 2020-06-27 ENCOUNTER — Encounter (HOSPITAL_COMMUNITY): Admission: AD | Disposition: A | Discharge status: Home / Self Care | Payer: Self-pay | Attending: Internal Medicine

## 2020-06-27 ENCOUNTER — Inpatient Hospital Stay (HOSPITAL_COMMUNITY): Payer: Medicaid Other | Admitting: Certified Registered Nurse Anesthetist

## 2020-06-27 ENCOUNTER — Other Ambulatory Visit: Payer: Self-pay

## 2020-06-27 DIAGNOSIS — S42351A Displaced comminuted fracture of shaft of humerus, right arm, initial encounter for closed fracture: Secondary | ICD-10-CM | POA: Diagnosis not present

## 2020-06-27 DIAGNOSIS — Z419 Encounter for procedure for purposes other than remedying health state, unspecified: Secondary | ICD-10-CM

## 2020-06-27 DIAGNOSIS — F250 Schizoaffective disorder, bipolar type: Secondary | ICD-10-CM | POA: Diagnosis not present

## 2020-06-27 HISTORY — PX: ORIF HUMERUS FRACTURE: SHX2126

## 2020-06-27 LAB — BASIC METABOLIC PANEL
Anion gap: 9 (ref 5–15)
BUN: 9 mg/dL (ref 6–20)
CO2: 25 mmol/L (ref 22–32)
Calcium: 9 mg/dL (ref 8.9–10.3)
Chloride: 103 mmol/L (ref 98–111)
Creatinine, Ser: 0.83 mg/dL (ref 0.61–1.24)
GFR, Estimated: >60 mL/min (ref >60)
Glucose, Bld: 93 mg/dL (ref 70–99)
Potassium: 3.9 mmol/L (ref 3.5–5.1)
Sodium: 137 mmol/L (ref 135–145)

## 2020-06-27 LAB — CBC
HCT: 37.7 % — ABNORMAL LOW (ref 39.0–52.0)
Hemoglobin: 11.8 g/dL — ABNORMAL LOW (ref 13.0–17.0)
MCH: 26.9 pg (ref 26.0–34.0)
MCHC: 31.3 g/dL (ref 30.0–36.0)
MCV: 85.9 fL (ref 80.0–100.0)
Platelets: 308 10*3/uL (ref 150–400)
RBC: 4.39 MIL/uL (ref 4.22–5.81)
RDW: 14.2 % (ref 11.5–15.5)
WBC: 9 10*3/uL (ref 4.0–10.5)
nRBC: 0 % (ref 0.0–0.2)

## 2020-06-27 LAB — HIV ANTIBODY (ROUTINE TESTING W REFLEX): HIV Screen 4th Generation wRfx: NONREACTIVE

## 2020-06-27 SURGERY — OPEN REDUCTION INTERNAL FIXATION (ORIF) HUMERAL SHAFT FRACTURE
Anesthesia: General | Laterality: Right

## 2020-06-27 MED ORDER — CEFAZOLIN SODIUM-DEXTROSE 2-4 GM/100ML-% IV SOLN
INTRAVENOUS | Status: AC
  Filled 2020-06-27: qty 100

## 2020-06-27 MED ORDER — ACETAMINOPHEN 500 MG PO TABS
1000.0000 mg | ORAL_TABLET | Freq: Once | ORAL | Status: AC
  Administered 2020-06-27: 1000 mg via ORAL
  Filled 2020-06-27: qty 2

## 2020-06-27 MED ORDER — CEFAZOLIN SODIUM-DEXTROSE 2-4 GM/100ML-% IV SOLN
2.0000 g | Freq: Three times a day (TID) | INTRAVENOUS | Status: AC
  Administered 2020-06-27 – 2020-06-28 (×3): 2 g via INTRAVENOUS
  Filled 2020-06-27 (×3): qty 100

## 2020-06-27 MED ORDER — PROPOFOL 10 MG/ML IV BOLUS
INTRAVENOUS | Status: DC | PRN
  Administered 2020-06-27: 200 mg via INTRAVENOUS

## 2020-06-27 MED ORDER — CELECOXIB 200 MG PO CAPS
200.0000 mg | ORAL_CAPSULE | Freq: Once | ORAL | Status: AC
  Administered 2020-06-27: 200 mg via ORAL
  Filled 2020-06-27: qty 1

## 2020-06-27 MED ORDER — OXYCODONE HCL 5 MG PO TABS: 5.0000 mg | ORAL_TABLET | ORAL | Status: DC | PRN

## 2020-06-27 MED ORDER — VANCOMYCIN HCL 1000 MG IV SOLR
INTRAVENOUS | Status: DC | PRN
  Administered 2020-06-27: 1000 mg

## 2020-06-27 MED ORDER — MIDAZOLAM HCL 2 MG/2ML IJ SOLN
INTRAMUSCULAR | Status: AC
  Filled 2020-06-27: qty 2

## 2020-06-27 MED ORDER — ASPIRIN EC 81 MG PO TBEC
81.0000 mg | DELAYED_RELEASE_TABLET | Freq: Every day | ORAL | Status: DC
  Administered 2020-06-28: 81 mg via ORAL
  Filled 2020-06-27: qty 1

## 2020-06-27 MED ORDER — VANCOMYCIN HCL 1000 MG IV SOLR
INTRAVENOUS | Status: AC
  Filled 2020-06-27: qty 1000

## 2020-06-27 MED ORDER — MORPHINE SULFATE (PF) 2 MG/ML IV SOLN: 1.0000 mg | INTRAVENOUS | Status: DC | PRN

## 2020-06-27 MED ORDER — PROPOFOL 10 MG/ML IV BOLUS
INTRAVENOUS | Status: AC
  Filled 2020-06-27: qty 20

## 2020-06-27 MED ORDER — PROMETHAZINE HCL 25 MG/ML IJ SOLN: 6.2500 mg | INTRAMUSCULAR | Status: DC | PRN

## 2020-06-27 MED ORDER — ROCURONIUM BROMIDE 10 MG/ML (PF) SYRINGE
PREFILLED_SYRINGE | INTRAVENOUS | Status: DC | PRN
  Administered 2020-06-27: 90 mg via INTRAVENOUS

## 2020-06-27 MED ORDER — 0.9 % SODIUM CHLORIDE (POUR BTL) OPTIME
TOPICAL | Status: DC | PRN
  Administered 2020-06-27: 1000 mL

## 2020-06-27 MED ORDER — FENTANYL CITRATE (PF) 100 MCG/2ML IJ SOLN: 25.0000 ug | INTRAMUSCULAR | Status: DC | PRN

## 2020-06-27 MED ORDER — METHOCARBAMOL 500 MG PO TABS: 500.0000 mg | ORAL_TABLET | Freq: Four times a day (QID) | ORAL | Status: DC | PRN

## 2020-06-27 MED ORDER — LACTATED RINGERS IV SOLN: INTRAVENOUS | Status: DC | PRN

## 2020-06-27 MED ORDER — FENTANYL CITRATE (PF) 250 MCG/5ML IJ SOLN
INTRAMUSCULAR | Status: AC
  Filled 2020-06-27: qty 5

## 2020-06-27 MED ORDER — BUPIVACAINE HCL (PF) 0.25 % IJ SOLN
INTRAMUSCULAR | Status: AC
  Filled 2020-06-27: qty 30

## 2020-06-27 MED ORDER — LIDOCAINE 2% (20 MG/ML) 5 ML SYRINGE
INTRAMUSCULAR | Status: DC | PRN
  Administered 2020-06-27: 100 mg via INTRAVENOUS

## 2020-06-27 MED ORDER — ONDANSETRON HCL 4 MG/2ML IJ SOLN
INTRAMUSCULAR | Status: DC | PRN
  Administered 2020-06-27: 4 mg via INTRAVENOUS

## 2020-06-27 MED ORDER — MIDAZOLAM HCL 2 MG/2ML IJ SOLN
INTRAMUSCULAR | Status: DC | PRN
  Administered 2020-06-27: 2 mg via INTRAVENOUS

## 2020-06-27 MED ORDER — CEFAZOLIN SODIUM-DEXTROSE 2-3 GM-%(50ML) IV SOLR
INTRAVENOUS | Status: DC | PRN
  Administered 2020-06-27: 2 g via INTRAVENOUS

## 2020-06-27 MED ORDER — FENTANYL CITRATE (PF) 250 MCG/5ML IJ SOLN
INTRAMUSCULAR | Status: DC | PRN
  Administered 2020-06-27 (×2): 50 ug via INTRAVENOUS
  Administered 2020-06-27: 100 ug via INTRAVENOUS
  Administered 2020-06-27 (×3): 25 ug via INTRAVENOUS
  Administered 2020-06-27: 50 ug via INTRAVENOUS

## 2020-06-27 MED ORDER — SUGAMMADEX SODIUM 200 MG/2ML IV SOLN
INTRAVENOUS | Status: DC | PRN
  Administered 2020-06-27: 200 mg via INTRAVENOUS

## 2020-06-27 MED ORDER — DEXAMETHASONE SODIUM PHOSPHATE 10 MG/ML IJ SOLN
INTRAMUSCULAR | Status: DC | PRN
  Administered 2020-06-27: 10 mg via INTRAVENOUS

## 2020-06-27 SURGICAL SUPPLY — 74 items
ADH SKN CLS APL DERMABOND .7 (GAUZE/BANDAGES/DRESSINGS) ×1
APL PRP STRL LF DISP 70% ISPRP (MISCELLANEOUS) ×1
BIT DRILL L45 QC 2.7X125 (BIT) IMPLANT
BIT DRILL Q/COUPLING 1 (BIT) ×4 IMPLANT
BIT DRILL QC 2.7X125 (BIT) ×3
BIT DRILL QC 2X140 (BIT) ×2 IMPLANT
BNDG COHESIVE 4X5 TAN STRL (GAUZE/BANDAGES/DRESSINGS) ×3 IMPLANT
BNDG ELASTIC 4X5.8 VLCR STR LF (GAUZE/BANDAGES/DRESSINGS) ×5 IMPLANT
BNDG ELASTIC 6X5.8 VLCR STR LF (GAUZE/BANDAGES/DRESSINGS) ×3 IMPLANT
BRUSH SCRUB EZ PLAIN DRY (MISCELLANEOUS) ×6 IMPLANT
CHLORAPREP W/TINT 26 (MISCELLANEOUS) ×3 IMPLANT
COVER SURGICAL LIGHT HANDLE (MISCELLANEOUS) ×6 IMPLANT
COVER WAND RF STERILE (DRAPES) ×3 IMPLANT
DERMABOND ADVANCED (GAUZE/BANDAGES/DRESSINGS) ×2
DERMABOND ADVANCED .7 DNX12 (GAUZE/BANDAGES/DRESSINGS) ×2 IMPLANT
DRAPE C-ARM 42X72 X-RAY (DRAPES) ×3 IMPLANT
DRAPE INCISE IOBAN 66X45 STRL (DRAPES) ×3 IMPLANT
DRAPE ORTHO SPLIT 77X108 STRL (DRAPES) ×6
DRAPE SURG 17X23 STRL (DRAPES) ×3 IMPLANT
DRAPE SURG ORHT 6 SPLT 77X108 (DRAPES) ×2 IMPLANT
DRAPE U-SHAPE 47X51 STRL (DRAPES) ×6 IMPLANT
DRSG MEPILEX BORDER 4X8 (GAUZE/BANDAGES/DRESSINGS) ×3 IMPLANT
DRSG PAD ABDOMINAL 8X10 ST (GAUZE/BANDAGES/DRESSINGS) ×3 IMPLANT
ELECT REM PT RETURN 9FT ADLT (ELECTROSURGICAL) ×3
ELECTRODE REM PT RTRN 9FT ADLT (ELECTROSURGICAL) ×1 IMPLANT
EVACUATOR 1/8 PVC DRAIN (DRAIN) IMPLANT
GAUZE SPONGE 4X4 12PLY STRL (GAUZE/BANDAGES/DRESSINGS) ×2 IMPLANT
GLOVE BIO SURGEON STRL SZ 6.5 (GLOVE) ×6 IMPLANT
GLOVE BIO SURGEON STRL SZ7.5 (GLOVE) ×12 IMPLANT
GLOVE BIO SURGEONS STRL SZ 6.5 (GLOVE) ×3
GLOVE BIOGEL PI IND STRL 6.5 (GLOVE) ×1 IMPLANT
GLOVE BIOGEL PI IND STRL 7.5 (GLOVE) ×1 IMPLANT
GLOVE BIOGEL PI INDICATOR 6.5 (GLOVE) ×2
GLOVE BIOGEL PI INDICATOR 7.5 (GLOVE) ×2
GOWN STRL REUS W/ TWL LRG LVL3 (GOWN DISPOSABLE) ×2 IMPLANT
GOWN STRL REUS W/TWL LRG LVL3 (GOWN DISPOSABLE) ×6
K-WIRE 1.6X150 (WIRE) ×3
KIT BASIN OR (CUSTOM PROCEDURE TRAY) ×3 IMPLANT
KIT TURNOVER KIT B (KITS) ×3 IMPLANT
KWIRE 1.6X150 (WIRE) IMPLANT
MANIFOLD NEPTUNE II (INSTRUMENTS) ×3 IMPLANT
NDL HYPO 25X1 1.5 SAFETY (NEEDLE) ×1 IMPLANT
NEEDLE HYPO 25X1 1.5 SAFETY (NEEDLE) ×3 IMPLANT
NS IRRIG 1000ML POUR BTL (IV SOLUTION) ×3 IMPLANT
PACK ORTHO EXTREMITY (CUSTOM PROCEDURE TRAY) ×3 IMPLANT
PAD ARMBOARD 7.5X6 YLW CONV (MISCELLANEOUS) ×4 IMPLANT
PADDING CAST ABS 3INX4YD NS (CAST SUPPLIES) ×4
PADDING CAST ABS COTTON 3X4 (CAST SUPPLIES) IMPLANT
PLATE LOCKING 9 HOLE (Plate) ×2 IMPLANT
SCREW CORTEX 2.7 28MM (Screw) ×4 IMPLANT
SCREW CORTEX ST 4.5X28 (Screw) ×2 IMPLANT
SCREW CORTEX ST 4.5X30 (Screw) ×6 IMPLANT
SCREW CORTEX ST 4.5X32 (Screw) ×2 IMPLANT
SCREW CORTEX ST 4.5X34 (Screw) ×2 IMPLANT
SCREW METAPHYSEAL 2.7X24MM (Screw) ×4 IMPLANT
SPONGE LAP 18X18 RF (DISPOSABLE) ×3 IMPLANT
STAPLER VISISTAT 35W (STAPLE) ×3 IMPLANT
SUCTION FRAZIER HANDLE 10FR (MISCELLANEOUS) ×3
SUCTION TUBE FRAZIER 10FR DISP (MISCELLANEOUS) ×1 IMPLANT
SUT ETHILON 3 0 PS 1 (SUTURE) ×6 IMPLANT
SUT MNCRL AB 3-0 PS2 18 (SUTURE) ×3 IMPLANT
SUT PROLENE 0 CT (SUTURE) IMPLANT
SUT VIC AB 0 CT1 27 (SUTURE) ×6
SUT VIC AB 0 CT1 27XBRD ANBCTR (SUTURE) ×2 IMPLANT
SUT VIC AB 2-0 CT1 27 (SUTURE) ×6
SUT VIC AB 2-0 CT1 TAPERPNT 27 (SUTURE) ×2 IMPLANT
SYR CONTROL 10ML LL (SYRINGE) ×3 IMPLANT
TOWEL GREEN STERILE (TOWEL DISPOSABLE) ×9 IMPLANT
TOWEL GREEN STERILE FF (TOWEL DISPOSABLE) ×3 IMPLANT
TRAY FOLEY MTR SLVR 16FR STAT (SET/KITS/TRAYS/PACK) IMPLANT
TUBE CONNECTING 12'X1/4 (SUCTIONS) ×1
TUBE CONNECTING 12X1/4 (SUCTIONS) ×2 IMPLANT
WATER STERILE IRR 1000ML POUR (IV SOLUTION) ×3 IMPLANT
YANKAUER SUCT BULB TIP NO VENT (SUCTIONS) IMPLANT

## 2020-06-27 NOTE — Plan of Care (Signed)

## 2020-06-27 NOTE — Anesthesia Preprocedure Evaluation (Addendum)
Anesthesia Evaluation  Patient identified by MRN, date of birth, ID band Patient awake    Reviewed: Allergy & Precautions, NPO status , Patient's Chart, lab work & pertinent test results  Airway Mallampati: II  TM Distance: >3 FB Neck ROM: Full    Dental no notable dental hx. (+) Dental Advisory Given   Pulmonary Current Smoker,    Pulmonary exam normal        Cardiovascular negative cardio ROS Normal cardiovascular exam     Neuro/Psych PSYCHIATRIC DISORDERS Bipolar Disorder Schizophrenia negative neurological ROS     GI/Hepatic negative GI ROS, Neg liver ROS,   Endo/Other  negative endocrine ROS  Renal/GU negative Renal ROS  negative genitourinary   Musculoskeletal negative musculoskeletal ROS (+)   Abdominal   Peds negative pediatric ROS (+)  Hematology negative hematology ROS (+)   Anesthesia Other Findings   Reproductive/Obstetrics negative OB ROS                            Anesthesia Physical Anesthesia Plan  ASA: III  Anesthesia Plan: General   Post-op Pain Management:    Induction: Intravenous  PONV Risk Score and Plan: 2 and Ondansetron and Dexamethasone  Airway Management Planned: Oral ETT  Additional Equipment:   Intra-op Plan:   Post-operative Plan: Extubation in OR  Informed Consent: I have reviewed the patients History and Physical, chart, labs and discussed the procedure including the risks, benefits and alternatives for the proposed anesthesia with the patient or authorized representative who has indicated his/her understanding and acceptance.     Dental advisory given  Plan Discussed with: Anesthesiologist, CRNA and Surgeon  Anesthesia Plan Comments: (No pre-op block per surgeon because of concern regarding radial nerve.)      Anesthesia Quick Evaluation

## 2020-06-27 NOTE — Progress Notes (Signed)
Pt arrived to unit, VSS, NV focused exam charted, sitter in room. Per psych note pt IVC will be d/c'd, until then safety sitter in room. Pt denies SI and HI, displays flat affect and is denying any pain medication. Will follow the post-op order set.
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
## 2020-06-27 NOTE — Progress Notes (Signed)
Pt IVC has been rescinded, safety and sitter orders have been d/c'd by verbal order from MD. will continue to monitor.
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
## 2020-06-27 NOTE — Discharge Instructions (Signed)
Orthopaedic Trauma Service Discharge Instructions   General Discharge Instructions  WEIGHT BEARING STATUS: Non-weightbearing right arm  RANGE OF MOTION/ACTIVITY: Ok for elbow and shoulder motion as tolerated. Wear your sling for comfort  Wound Care: You may remove your surgical dressing post op day #2 (Friday 06/29/20). You will notice skin glue over your incision that will flake off on its own over the next few weeks.  Incisions can be left open to air if there is no drainage. If incision continues to have drainage, follow wound care instructions below. Okay to shower if no drainage from incisions.  DVT/PE prophylaxis: Aspirin 81 mg  Diet: as you were eating previously.  Can use over the counter stool softeners and bowel preparations, such as Miralax, to help with bowel movements.  Narcotics can be constipating.  Be sure to drink plenty of fluids  PAIN MEDICATION USE AND EXPECTATIONS  You have likely been given narcotic medications to help control your pain.  After a traumatic event that results in an fracture (broken bone) with or without surgery, it is ok to use narcotic pain medications to help control one's pain.  We understand that everyone responds to pain differently and each individual patient will be evaluated on a regular basis for the continued need for narcotic medications. Ideally, narcotic medication use should last no more than 6-8 weeks (coinciding with fracture healing).   As a patient it is your responsibility as well to monitor narcotic medication use and report the amount and frequency you use these medications when you come to your office visit.   We would also advise that if you are using narcotic medications, you should take a dose prior to therapy to maximize you participation.  IF YOU ARE ON NARCOTIC MEDICATIONS IT IS NOT PERMISSIBLE TO OPERATE A MOTOR VEHICLE (MOTORCYCLE/CAR/TRUCK/MOPED) OR HEAVY MACHINERY DO NOT MIX NARCOTICS WITH OTHER CNS (CENTRAL NERVOUS  SYSTEM) DEPRESSANTS SUCH AS ALCOHOL   STOP SMOKING OR USING NICOTINE PRODUCTS!!!!  As discussed nicotine severely impairs your body's ability to heal surgical and traumatic wounds but also impairs bone healing.  Wounds and bone heal by forming microscopic blood vessels (angiogenesis) and nicotine is a vasoconstrictor (essentially, shrinks blood vessels).  Therefore, if vasoconstriction occurs to these microscopic blood vessels they essentially disappear and are unable to deliver necessary nutrients to the healing tissue.  This is one modifiable factor that you can do to dramatically increase your chances of healing your injury.    (This means no smoking, no nicotine gum, patches, etc)  DO NOT USE NONSTEROIDAL ANTI-INFLAMMATORY DRUGS (NSAID'S)  Using products such as Advil (ibuprofen), Aleve (naproxen), Motrin (ibuprofen) for additional pain control during fracture healing can delay and/or prevent the healing response.  If you would like to take over the counter (OTC) medication, Tylenol (acetaminophen) is ok.  However, some narcotic medications that are given for pain control contain acetaminophen as well. Therefore, you should not exceed more than 4000 mg of tylenol in a day if you do not have liver disease.  Also note that there are may OTC medicines, such as cold medicines and allergy medicines that my contain tylenol as well.  If you have any questions about medications and/or interactions please ask your doctor/PA or your pharmacist.      ICE AND ELEVATE INJURED/OPERATIVE EXTREMITY  Using ice and elevating the injured extremity above your heart can help with swelling and pain control.  Icing in a pulsatile fashion, such as 20 minutes on and 20 minutes off,  can be followed.    Do not place ice directly on skin. Make sure there is a barrier between to skin and the ice pack.    Using frozen items such as frozen peas works well as the conform nicely to the are that needs to be iced.  USE AN ACE WRAP  OR TED HOSE FOR SWELLING CONTROL  In addition to icing and elevation, Ace wraps or TED hose are used to help limit and resolve swelling.  It is recommended to use Ace wraps or TED hose until you are informed to stop.    When using Ace Wraps start the wrapping distally (farthest away from the body) and wrap proximally (closer to the body)   Example: If you had surgery on your leg or thing and you do not have a splint on, start the ace wrap at the toes and work your way up to the thigh        If you had surgery on your upper extremity and do not have a splint on, start the ace wrap at your fingers and work your way up to the upper arm    CALL THE OFFICE WITH ANY QUESTIONS OR CONCERNS: [phone]   VISIT OUR WEBSITE FOR ADDITIONAL INFORMATION: [email or web address]    Discharge Wound Care Instructions  Do NOT apply any ointments, solutions or lotions to pin sites or surgical wounds.  These prevent needed drainage and even though solutions like hydrogen peroxide kill bacteria, they also damage cells lining the pin sites that help fight infection.  Applying lotions or ointments can keep the wounds moist and can cause them to breakdown and open up as well. This can increase the risk for infection. When in doubt call the office.  Surgical incisions should be dressed daily.  If any drainage is noted, use one layer of adaptic, then gauze, Kerlix, and an ace wrap.  Once the incision is completely dry and without drainage, it may be left open to air out.  Showering may begin 36-48 hours later.  Cleaning gently with soap and water.  Traumatic wounds should be dressed daily as well.    One layer of adaptic, gauze, Kerlix, then ace wrap.  The adaptic can be discontinued once the draining has ceased    If you have a wet to dry dressing: wet the gauze with saline the squeeze as much saline out so the gauze is moist (not soaking wet), place moistened gauze over wound, then place a dry gauze over the moist  one, followed by Kerlix wrap, then ace wrap.

## 2020-06-27 NOTE — Anesthesia Procedure Notes (Signed)
Procedure Name: Intubation Date/Time: 06/27/2020 8:41 AM Performed by: Clearnce Sorrel, CRNA Pre-anesthesia Checklist: Patient identified, Emergency Drugs available, Suction available, Patient being monitored and Timeout performed Patient Re-evaluated:Patient Re-evaluated prior to induction Oxygen Delivery Method: Circle system utilized Preoxygenation: Pre-oxygenation with 100% oxygen Induction Type: IV induction Ventilation: Mask ventilation without difficulty Laryngoscope Size: Mac and 4 Grade View: Grade I Tube type: Oral Tube size: 7.5 mm Number of attempts: 1 Airway Equipment and Method: Stylet Placement Confirmation: ETT inserted through vocal cords under direct vision,  positive ETCO2 and breath sounds checked- equal and bilateral Secured at: 24 cm Tube secured with: Tape Dental Injury: Teeth and Oropharynx as per pre-operative assessment

## 2020-06-27 NOTE — Plan of Care (Signed)
Patient remains IVC'd, on suicide precautions. Sitter at bedside. Patient denies any pain, NPO for surgery today. New PIV 20G to left FA placed. Problem: Education: Goal: Knowledge of General Education information will improve Description: Including pain rating scale, medication(s)/side effects and non-pharmacologic comfort measures Outcome: Progressing   Problem: Health Behavior/Discharge Planning: Goal: Ability to manage health-related needs will improve Outcome: Progressing   Problem: Clinical Measurements: Goal: Ability to maintain clinical measurements within normal limits will improve Outcome: Progressing Goal: Will remain free from infection Outcome: Progressing Goal: Diagnostic test results will improve Outcome: Progressing Goal: Respiratory complications will improve Outcome: Progressing Goal: Cardiovascular complication will be avoided Outcome: Progressing   Problem: Activity: Goal: Risk for activity intolerance will decrease Outcome: Progressing   Problem: Nutrition: Goal: Adequate nutrition will be maintained Outcome: Progressing   Problem: Coping: Goal: Level of anxiety will decrease Outcome: Progressing   Problem: Elimination: Goal: Will not experience complications related to bowel motility Outcome: Progressing Goal: Will not experience complications related to urinary retention Outcome: Progressing   Problem: Pain Managment: Goal: General experience of comfort will improve Outcome: Progressing   Problem: Safety: Goal: Ability to remain free from injury will improve Outcome: Progressing   Problem: Skin Integrity: Goal: Risk for impaired skin integrity will decrease Outcome: Progressing

## 2020-06-27 NOTE — Progress Notes (Signed)
Surgeon at bedside stating IVC had been resended and patient able to consent for self for today's surgery. Informed consent obtained at this time.

## 2020-06-27 NOTE — Transfer of Care (Signed)
Immediate Anesthesia Transfer of Care Note  Patient: Ernest Cook  Procedure(s) Performed: OPEN REDUCTION INTERNAL FIXATION (ORIF) HUMERAL SHAFT FRACTURE (Right )  Patient Location: PACU  Anesthesia Type:General  Level of Consciousness: drowsy  Airway & Oxygen Therapy: Patient Spontanous Breathing and Patient connected to nasal cannula oxygen  Post-op Assessment: Report given to RN and Post -op Vital signs reviewed and stable  Post vital signs: Reviewed and stable  Last Vitals:  Vitals Value Taken Time  BP 170/104 06/27/20 1059  Temp    Pulse 103 06/27/20 1100  Resp 16 06/27/20 1100  SpO2 100 % 06/27/20 1100  Vitals shown include unvalidated device data.  Last Pain:  Vitals:   06/27/20 0100  TempSrc:   PainSc: 0-No pain         Complications: No complications documented.
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
## 2020-06-27 NOTE — Progress Notes (Signed)
Orthopedic Tech Progress Note Patient Details:  Ernest Cook 02/16/1991 [id number]  Ortho Devices Type of Ortho Device: Arm sling Ortho Device/Splint Location: RUE Ortho Device/Splint Interventions: Ordered, Application, Adjustment   Post Interventions Patient Tolerated: Well Instructions Provided: Care of device   Donald Pore 06/27/2020, 2:59 PM

## 2020-06-27 NOTE — Anesthesia Postprocedure Evaluation (Signed)
Anesthesia Post Note  Patient: Ernest Cook  Procedure(s) Performed: OPEN REDUCTION INTERNAL FIXATION (ORIF) HUMERAL SHAFT FRACTURE (Right )     Patient location during evaluation: PACU Anesthesia Type: General Level of consciousness: sedated Pain management: pain level controlled Vital Signs Assessment: post-procedure vital signs reviewed and stable Respiratory status: spontaneous breathing and respiratory function stable Cardiovascular status: stable Postop Assessment: no apparent nausea or vomiting Anesthetic complications: no   No complications documented.  Last Vitals:  Vitals:   06/27/20 1145 06/27/20 1218  BP: (!) 156/93 (!) 148/93  Pulse: 86 70  Resp: 20 17  Temp:  36.8 C  SpO2: 99% 99%    Last Pain:  Vitals:   06/27/20 1218  TempSrc: Oral  PainSc: 7                  Taryne Kiger DANIEL

## 2020-06-27 NOTE — Social Work (Signed)
Pts IVC has been rescined. Copy placed on chart.   Ernest Cook, Theresia Majors, [place] Clinical Social Worker [phone]

## 2020-06-28 ENCOUNTER — Encounter (HOSPITAL_COMMUNITY): Payer: Self-pay | Admitting: Student

## 2020-06-28 DIAGNOSIS — F250 Schizoaffective disorder, bipolar type: Secondary | ICD-10-CM | POA: Diagnosis not present

## 2020-06-28 DIAGNOSIS — S42351A Displaced comminuted fracture of shaft of humerus, right arm, initial encounter for closed fracture: Secondary | ICD-10-CM | POA: Diagnosis not present

## 2020-06-28 LAB — PHOSPHORUS: Phosphorus: 3.8 mg/dL (ref 2.5–4.6)

## 2020-06-28 LAB — COMPREHENSIVE METABOLIC PANEL
ALT: 66 U/L — ABNORMAL HIGH (ref 0–44)
AST: 127 U/L — ABNORMAL HIGH (ref 15–41)
Albumin: 3.2 g/dL — ABNORMAL LOW (ref 3.5–5.0)
Alkaline Phosphatase: 47 U/L (ref 38–126)
Anion gap: 9 (ref 5–15)
BUN: 9 mg/dL (ref 6–20)
CO2: 27 mmol/L (ref 22–32)
Calcium: 9.3 mg/dL (ref 8.9–10.3)
Chloride: 103 mmol/L (ref 98–111)
Creatinine, Ser: 0.9 mg/dL (ref 0.61–1.24)
GFR, Estimated: >60 mL/min (ref >60)
Glucose, Bld: 118 mg/dL — ABNORMAL HIGH (ref 70–99)
Potassium: 4.4 mmol/L (ref 3.5–5.1)
Sodium: 139 mmol/L (ref 135–145)
Total Bilirubin: 0.6 mg/dL (ref 0.3–1.2)
Total Protein: 6.1 g/dL — ABNORMAL LOW (ref 6.5–8.1)

## 2020-06-28 LAB — CBC WITH DIFFERENTIAL/PLATELET
Abs Immature Granulocytes: 0.07 10*3/uL (ref 0.00–0.07)
Basophils Absolute: 0 10*3/uL (ref 0.0–0.1)
Basophils Relative: 0 %
Eosinophils Absolute: 0 10*3/uL (ref 0.0–0.5)
Eosinophils Relative: 0 %
HCT: 35.7 % — ABNORMAL LOW (ref 39.0–52.0)
Hemoglobin: 11.3 g/dL — ABNORMAL LOW (ref 13.0–17.0)
Immature Granulocytes: 0 %
Lymphocytes Relative: 12 %
Lymphs Abs: 2.1 10*3/uL (ref 0.7–4.0)
MCH: 26.6 pg (ref 26.0–34.0)
MCHC: 31.7 g/dL (ref 30.0–36.0)
MCV: 84 fL (ref 80.0–100.0)
Monocytes Absolute: 1.6 10*3/uL — ABNORMAL HIGH (ref 0.1–1.0)
Monocytes Relative: 9 %
Neutro Abs: 13.5 10*3/uL — ABNORMAL HIGH (ref 1.7–7.7)
Neutrophils Relative %: 79 %
Platelets: 340 10*3/uL (ref 150–400)
RBC: 4.25 MIL/uL (ref 4.22–5.81)
RDW: 13.9 % (ref 11.5–15.5)
WBC: 17.3 10*3/uL — ABNORMAL HIGH (ref 4.0–10.5)
nRBC: 0 % (ref 0.0–0.2)

## 2020-06-28 LAB — VITAMIN D 25 HYDROXY (VIT D DEFICIENCY, FRACTURES): Vit D, 25-Hydroxy: 13.99 ng/mL — ABNORMAL LOW (ref 30–100)

## 2020-06-28 LAB — MAGNESIUM: Magnesium: 2.1 mg/dL (ref 1.7–2.4)

## 2020-06-28 MED ORDER — LITHIUM CARBONATE 300 MG PO CAPS
300.0000 mg | ORAL_CAPSULE | Freq: Every day | ORAL | 0 refills | Status: DC
Start: 2020-06-28 — End: 2020-08-01

## 2020-06-28 MED ORDER — HYDROCODONE-ACETAMINOPHEN 5-325 MG PO TABS: 1.0000 | ORAL_TABLET | Freq: Three times a day (TID) | ORAL | 0 refills | Status: DC | PRN

## 2020-06-28 MED ORDER — LITHIUM CARBONATE 150 MG PO CAPS
450.0000 mg | ORAL_CAPSULE | Freq: Two times a day (BID) | ORAL | 0 refills | Status: DC
Start: 2020-06-28 — End: 2020-08-01

## 2020-06-28 MED ORDER — ASPIRIN 81 MG PO TBEC
81.0000 mg | DELAYED_RELEASE_TABLET | Freq: Every day | ORAL | 0 refills | Status: DC
Start: 2020-06-28 — End: 2020-08-01

## 2020-06-28 MED ORDER — VITAMIN D3 25 MCG PO TABS
2000.0000 [IU] | ORAL_TABLET | Freq: Every day | ORAL | 2 refills | Status: DC
Start: 2020-06-28 — End: 2020-08-01

## 2020-06-28 MED ORDER — METHOCARBAMOL 500 MG PO TABS: 500.0000 mg | ORAL_TABLET | Freq: Four times a day (QID) | ORAL | 0 refills | Status: DC | PRN

## 2020-06-28 MED ORDER — VITAMIN D 25 MCG (1000 UNIT) PO TABS
2000.0000 [IU] | ORAL_TABLET | Freq: Every day | ORAL | Status: DC
  Administered 2020-06-28: 2000 [IU] via ORAL
  Filled 2020-06-28: qty 2

## 2020-06-28 NOTE — Plan of Care (Signed)
Patient is upset this morning. Attempted to replace oxygen sensor on his finger and he refused. He stated that, " the doctors better hurry up and discharge me". He also voiced that none of the doctors have been in to see him. Voicing that, " all I see are the nurses bringing pills. Why are you all trying to drug me up for". He said that he is ready to go home, that he lives in [place] and does not know why he is in [place].

## 2020-06-28 NOTE — Progress Notes (Signed)
Pt to be discharged this evening via [employer] service transportation to his cousin's place of employment where she will be able to transport him to his home. Pt refusing TOC medications: lithium and roboxin. Pt states "I have plenty of those at home, I don't need any more medications y'all have been giving me here." Pt informed his lithium dose has been changed and pt insists he does not want the [facility] pharmacy scripts filled. TOC pharmacy offered to waive the $9 fee and bill fee to medicaid, pt still refused medications. Pt irritable and paranoid at this time.

## 2020-06-28 NOTE — Plan of Care (Signed)
Patient reports pain to right arm, arm sling in place. Declines any medications to treat his pain. Patient educated on the need to call for assistance when getting up to the restroom, but non-compliant. He has been educated on the need to keep his arm under the sling. Problem: Education: Goal: Knowledge of General Education information will improve Description: Including pain rating scale, medication(s)/side effects and non-pharmacologic comfort measures Outcome: Progressing   Problem: Health Behavior/Discharge Planning: Goal: Ability to manage health-related needs will improve Outcome: Progressing   Problem: Clinical Measurements: Goal: Ability to maintain clinical measurements within normal limits will improve Outcome: Progressing Goal: Will remain free from infection Outcome: Progressing Goal: Diagnostic test results will improve Outcome: Progressing Goal: Respiratory complications will improve Outcome: Progressing Goal: Cardiovascular complication will be avoided Outcome: Progressing   Problem: Activity: Goal: Risk for activity intolerance will decrease Outcome: Progressing   Problem: Nutrition: Goal: Adequate nutrition will be maintained Outcome: Progressing   Problem: Coping: Goal: Level of anxiety will decrease Outcome: Progressing   Problem: Elimination: Goal: Will not experience complications related to bowel motility Outcome: Progressing Goal: Will not experience complications related to urinary retention Outcome: Progressing   Problem: Pain Managment: Goal: General experience of comfort will improve Outcome: Progressing   Problem: Safety: Goal: Ability to remain free from injury will improve Outcome: Progressing   Problem: Skin Integrity: Goal: Risk for impaired skin integrity will decrease Outcome: Progressing
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
## 2020-06-28 NOTE — TOC CAGE-AID Note (Signed)
Transition of Care [facility]) - CAGE-AID Screening   Patient Details  Name: Ernest Cook MRN: [id number] Date of Birth: Nov 14, 1990  Transition of Care [facility]) CM/SW Contact:    Emeterio Reeve, [place] Phone Number: 06/28/2020, 11:01 AM   Clinical Narrative:  CSW met with pt at bedside. CSW introduced self and explained her role at the hospital.  Pt denies alcohol use. Pt reports occasional marijuana use. Pt did not need resources at this time.   CAGE-AID Screening:    Have You Ever Felt You Ought to Cut Down on Your Drinking or Drug Use?: No Have People Annoyed You By Critizing Your Drinking Or Drug Use?: No Have You Felt Bad Or Guilty About Your Drinking Or Drug Use?: No Have You Ever Had a Drink or Used Drugs First Thing In The Morning to Steady Your Nerves or to Get Rid of a Hangover?: No CAGE-AID Score: 0  Substance Abuse Education Offered: Yes    Blima Ledger, [place] Social Worker [phone]
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
## 2020-06-28 NOTE — Plan of Care (Signed)
Discharge information reviewed with pt. Pt verbalized understanding of discharge instructions, pt with no questions. Escorted pt to lobby to pre-scheduled taxi service to transport pt to his cousin's place of employment where she will transport pt home. Pt discharged with all belongings.  Problem: Education: Goal: Knowledge of General Education information will improve Description: Including pain rating scale, medication(s)/side effects and non-pharmacologic comfort measures Outcome: Adequate for Discharge   Problem: Health Behavior/Discharge Planning: Goal: Ability to manage health-related needs will improve Outcome: Adequate for Discharge   Problem: Clinical Measurements: Goal: Ability to maintain clinical measurements within normal limits will improve Outcome: Adequate for Discharge Goal: Will remain free from infection Outcome: Adequate for Discharge Goal: Diagnostic test results will improve Outcome: Adequate for Discharge Goal: Respiratory complications will improve Outcome: Adequate for Discharge Goal: Cardiovascular complication will be avoided Outcome: Adequate for Discharge   Problem: Activity: Goal: Risk for activity intolerance will decrease Outcome: Adequate for Discharge   Problem: Nutrition: Goal: Adequate nutrition will be maintained Outcome: Adequate for Discharge   Problem: Coping: Goal: Level of anxiety will decrease Outcome: Adequate for Discharge   Problem: Elimination: Goal: Will not experience complications related to bowel motility Outcome: Adequate for Discharge Goal: Will not experience complications related to urinary retention Outcome: Adequate for Discharge   Problem: Pain Managment: Goal: General experience of comfort will improve Outcome: Adequate for Discharge   Problem: Safety: Goal: Ability to remain free from injury will improve Outcome: Adequate for Discharge   Problem: Skin Integrity: Goal: Risk for impaired skin integrity will  decrease Outcome: Adequate for Discharge

## 2020-07-06 ENCOUNTER — Other Ambulatory Visit: Payer: Self-pay

## 2020-07-06 ENCOUNTER — Emergency Department
Admission: EM | Admit: 2020-07-06 | Discharge: 2020-08-01 | Disposition: A | Discharge status: Home / Self Care | Payer: Medicaid Other | Attending: Emergency Medicine | Admitting: Emergency Medicine

## 2020-07-06 ENCOUNTER — Encounter: Payer: Self-pay | Admitting: Emergency Medicine

## 2020-07-06 DIAGNOSIS — F250 Schizoaffective disorder, bipolar type: Secondary | ICD-10-CM | POA: Diagnosis not present

## 2020-07-06 DIAGNOSIS — Z91199 Patient's noncompliance with other medical treatment and regimen due to unspecified reason: Secondary | ICD-10-CM

## 2020-07-06 DIAGNOSIS — F29 Unspecified psychosis not due to a substance or known physiological condition: Secondary | ICD-10-CM | POA: Insufficient documentation

## 2020-07-06 DIAGNOSIS — Z7982 Long term (current) use of aspirin: Secondary | ICD-10-CM | POA: Insufficient documentation

## 2020-07-06 DIAGNOSIS — S42301A Unspecified fracture of shaft of humerus, right arm, initial encounter for closed fracture: Secondary | ICD-10-CM | POA: Diagnosis present

## 2020-07-06 DIAGNOSIS — F17210 Nicotine dependence, cigarettes, uncomplicated: Secondary | ICD-10-CM | POA: Insufficient documentation

## 2020-07-06 DIAGNOSIS — Z9119 Patient's noncompliance with other medical treatment and regimen: Secondary | ICD-10-CM

## 2020-07-06 DIAGNOSIS — Z20822 Contact with and (suspected) exposure to covid-19: Secondary | ICD-10-CM | POA: Insufficient documentation

## 2020-07-06 MED ORDER — ATOMOXETINE HCL 40 MG PO CAPS
40.0000 mg | ORAL_CAPSULE | Freq: Every morning | ORAL | Status: DC
  Administered 2020-07-09 – 2020-08-01 (×21): 40 mg via ORAL
  Filled 2020-07-06: qty 1
  Filled 2020-07-06 (×2): qty 4
  Filled 2020-07-06 (×2): qty 1
  Filled 2020-07-06 (×3): qty 4
  Filled 2020-07-06 (×5): qty 1
  Filled 2020-07-06 (×2): qty 4
  Filled 2020-07-06 (×2): qty 1
  Filled 2020-07-06: qty 4
  Filled 2020-07-06 (×5): qty 1
  Filled 2020-07-06: qty 4
  Filled 2020-07-06 (×2): qty 1
  Filled 2020-07-06 (×2): qty 4
  Filled 2020-07-06: qty 1

## 2020-07-06 MED ORDER — HALOPERIDOL LACTATE 5 MG/ML IJ SOLN
5.0000 mg | Freq: Four times a day (QID) | INTRAMUSCULAR | Status: DC | PRN
  Administered 2020-07-13: 5 mg via INTRAMUSCULAR
  Filled 2020-07-06 (×2): qty 1

## 2020-07-06 MED ORDER — VITAMIN D 25 MCG (1000 UNIT) PO TABS
2000.0000 [IU] | ORAL_TABLET | Freq: Every day | ORAL | Status: DC
  Administered 2020-07-09 – 2020-08-01 (×23): 2000 [IU] via ORAL
  Filled 2020-07-06 (×27): qty 2

## 2020-07-06 MED ORDER — ASPIRIN EC 81 MG PO TBEC
81.0000 mg | DELAYED_RELEASE_TABLET | Freq: Every day | ORAL | Status: DC
  Administered 2020-07-09 – 2020-08-01 (×23): 81 mg via ORAL
  Filled 2020-07-06 (×27): qty 1

## 2020-07-06 MED ORDER — LORAZEPAM 2 MG PO TABS: 2.0000 mg | ORAL_TABLET | Freq: Once | ORAL | Status: DC

## 2020-07-06 MED ORDER — LITHIUM CITRATE 300 MG/5 ML PO SYRP
300.0000 mg | Freq: Every day | ORAL | Status: DC
  Administered 2020-07-07 (×2): 300 mg via ORAL
  Filled 2020-07-06 (×2): qty 5

## 2020-07-06 MED ORDER — HYDROCODONE-ACETAMINOPHEN 5-325 MG PO TABS
1.0000 | ORAL_TABLET | Freq: Three times a day (TID) | ORAL | Status: DC | PRN
  Filled 2020-07-06: qty 1

## 2020-07-06 MED ORDER — RISPERIDONE 1 MG PO TABS: 3.0000 mg | ORAL_TABLET | Freq: Every day | ORAL | Status: DC

## 2020-07-06 MED ORDER — LITHIUM CARBONATE 300 MG PO CAPS
450.0000 mg | ORAL_CAPSULE | Freq: Two times a day (BID) | ORAL | Status: DC
  Filled 2020-07-06 (×5): qty 1

## 2020-07-06 MED ORDER — DIPHENHYDRAMINE HCL 50 MG/ML IJ SOLN
50.0000 mg | Freq: Four times a day (QID) | INTRAMUSCULAR | Status: DC | PRN
  Administered 2020-07-13: 50 mg via INTRAMUSCULAR
  Filled 2020-07-06 (×2): qty 1

## 2020-07-06 MED ORDER — ZIPRASIDONE MESYLATE 20 MG IM SOLR
20.0000 mg | Freq: Once | INTRAMUSCULAR | Status: AC
  Administered 2020-07-06: 20 mg via INTRAMUSCULAR

## 2020-07-06 MED ORDER — LORAZEPAM 2 MG PO TABS
2.0000 mg | ORAL_TABLET | Freq: Four times a day (QID) | ORAL | Status: DC | PRN
  Administered 2020-07-09 – 2020-07-30 (×5): 2 mg via ORAL
  Filled 2020-07-06 (×8): qty 1

## 2020-07-06 MED ORDER — LORAZEPAM 2 MG/ML IJ SOLN
2.0000 mg | Freq: Four times a day (QID) | INTRAMUSCULAR | Status: DC | PRN
  Administered 2020-07-13: 2 mg via INTRAMUSCULAR
  Filled 2020-07-06 (×2): qty 1

## 2020-07-06 MED ORDER — LITHIUM CARBONATE 300 MG PO CAPS
300.0000 mg | ORAL_CAPSULE | Freq: Every day | ORAL | Status: DC
  Filled 2020-07-06: qty 1

## 2020-07-06 MED ORDER — METHOCARBAMOL 500 MG PO TABS
500.0000 mg | ORAL_TABLET | Freq: Four times a day (QID) | ORAL | Status: DC | PRN
  Filled 2020-07-06: qty 1

## 2020-07-06 MED ORDER — BENZTROPINE MESYLATE 1 MG PO TABS
1.0000 mg | ORAL_TABLET | Freq: Every day | ORAL | Status: DC
  Administered 2020-07-13 – 2020-07-31 (×20): 1 mg via ORAL
  Filled 2020-07-06 (×24): qty 1

## 2020-07-06 NOTE — ED Notes (Signed)
Hourly rounding reveals patient in room. No complaints, stable, in no acute distress. Q15 minute rounds and monitoring via Rover and Officer to continue.   

## 2020-07-06 NOTE — ED Notes (Signed)
Pt laying in bed and continues to talk to himself.

## 2020-07-06 NOTE — ED Notes (Addendum)
Pt staring at this RN when entering room, pt reports "you don't know nothing about taking care of a nigger, go get someone else who ain't white", unable to to establish any rapport with pt, pt appears to be reacting to his own stimuli  Pt doesn't answer any direct question and raises his voice to this RN    2 security officers at doorway

## 2020-07-06 NOTE — ED Notes (Signed)
Pt sitting on edge of bed, looking around, not talking

## 2020-07-06 NOTE — ED Notes (Signed)
Pt in doorway telling the officer he wants to go home.  Pt was able to be re-directed, but is adamant he is leaving today.

## 2020-07-06 NOTE — ED Notes (Signed)
Pt remains sitting on bed, talking loudly to self, no apparent distress

## 2020-07-06 NOTE — ED Notes (Signed)
Pt given lunch tray.
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
## 2020-07-06 NOTE — ED Notes (Signed)
Pt calm, but stated he wants to go home and will not take any medications. "I will take my meds at home."   Pt insists he will not hurt himself because he "goes to sleep every night."

## 2020-07-06 NOTE — ED Notes (Signed)
Handcuffs removed from patient.  Officers and security remain at patient's door.

## 2020-07-06 NOTE — ED Triage Notes (Addendum)
Pt ambulatory to triage in custody of [employer] developer for [employer]; pt st he does not know why he is here; denies SI or HI; pt is very hesitant to allow nurse to perform vs or follow nurse to exam room; pt is agitated and st he does not want to be here; noted stitches in place to rt upper arm but does not allow nurse to examine at this time
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
## 2020-07-06 NOTE — ED Notes (Signed)
Pt will not allow staff to check VS.  Pt insist he is going home.

## 2020-07-06 NOTE — ED Notes (Addendum)
[address] act team called to ask for a contact number for Dr. Toni Amend.  The patient's doctor will be calling to speak with him. Dr. Toni Amend made aware by this writer.   The ACT team wanted to be sure patient was not going to be discharged anytime soon.    Office number: [phone] After hours: [phone]
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
## 2020-07-06 NOTE — Consult Note (Signed)
  Brief follow-up to earlier consult: I have spoken with the psychiatrist who oversees the [employer] act team and [facility] who is familiar with Ernest Cook.  He describes to me what I think we already understand that this patient has had a decline over the last couple months or so with worsening psychosis repeated agitated threatening violent behavior inability to be managed outside the hospital even when compliant with medicine.  He is strongly requested that the patient be kept in an inpatient unit given his recent history because the risk otherwise was just more dangerous behavior and repeated hospital visits.  I agreed with him I thought this was definitely the most appropriate thing and we were working on trying to get Ernest Cook to an inpatient unit.  Meanwhile spoke with nursing shortly thereafter that the patient was getting agitated in the emergency room.  I have added orders for as needed medicines for agitation to cover this.

## 2020-07-06 NOTE — Consult Note (Signed)
[facility] Face-to-Face Psychiatry Consult   Reason for Consult: Consult for this 29 year old man with schizoaffective disorder brought back to the hospital under IVC Referring Physician: Roxan Hockey Patient Identification: Ernest Cook MRN:  [id number] Principal Diagnosis: Schizoaffective disorder, bipolar type (HCC) Diagnosis:  Principal Problem:   Schizoaffective disorder, bipolar type (HCC) Active Problems:   Noncompliance   Broken arm, right, closed, initial encounter   Total Time spent with patient: 1 hour  Subjective:   Ernest Cook is a 29 y.o. male patient admitted with patient mumbled slightly but did not engage in any conversation or answer questions directly.  HPI: History obtained from the chart from the referral paperwork from the emergency room staff and from my prior familiarity with the patient.  29 year old man with schizoaffective disorder brought in under IVC evidently after being found early this morning walking around town with a knife in his hand.  Confronted by police he dropped it and ran away.  Patient's behavior recently seems to have been agitated.  When he was picked up as documented he made comments about killing his family.  On interview today the patient does not cooperate with me.  Some of this might be sedation but some of it is definitely intentional refusal of cooperation.  He was here in our emergency room only about a week ago and was referred to [facility] for surgery on his fractured upper arm.  They did not at that time admit him to a psychiatric ward but discharged him.  It appears he has not made contact with his act team since then.  Unknown whether he has been using any substances.  Unknown but doubtful that he has been taking his prescription medicine  Past Psychiatric History: Patient has a history of schizoaffective disorder.  He has had long stretches of time of functioning extremely well being able to hold down a job and get along well in the  community but recently has had a decompensation that has been going on now altogether for probably a few months with aggression and hostility.  Risk to Self:   Risk to Others:   Prior Inpatient Therapy:   Prior Outpatient Therapy:    Past Medical History:  Past Medical History:  Diagnosis Date  . Schizo affective schizophrenia [facility])     Past Surgical History:  Procedure Laterality Date  . ORIF HUMERUS FRACTURE Right 06/27/2020   Procedure: OPEN REDUCTION INTERNAL FIXATION (ORIF) HUMERAL SHAFT FRACTURE;  Surgeon: Roby Lofts, MD;  Location: [facility] OR;  Service: Orthopedics;  Laterality: Right;  . SKIN GRAFT Left unknown   Pt reports left foot and leg skin graft   Family History:  Family History  Problem Relation Age of Onset  . Diabetes Mother   . Hypertension Mother    Family Psychiatric  History: See previous Social History:  Social History   Substance and Sexual Activity  Alcohol Use No     Social History   Substance and Sexual Activity  Drug Use Yes  . Types: Marijuana   Comment: "every now and again"    Social History   Socioeconomic History  . Marital status: Single    Spouse name: Not on file  . Number of children: Not on file  . Years of education: Not on file  . Highest education level: Not on file  Occupational History  . Not on file  Tobacco Use  . Smoking status: Current Every Day Smoker    Packs/day: 0.25    Types:  Cigarettes  . Smokeless tobacco: Never Used  Substance and Sexual Activity  . Alcohol use: No  . Drug use: Yes    Types: Marijuana    Comment: "every now and again"  . Sexual activity: Yes    Birth control/protection: Condom    Comment: Pt reports he is attracted to men  Other Topics Concern  . Not on file  Social History Narrative  . Not on file   Social Determinants of Health   Financial Resource Strain:   . Difficulty of Paying Living Expenses: Not on file  Food Insecurity:   . Worried About Programme researcher, broadcasting/film/videounning Out of Food in the  Last Year: Not on file  . Ran Out of Food in the Last Year: Not on file  Transportation Needs:   . Lack of Transportation (Medical): Not on file  . Lack of Transportation (Non-Medical): Not on file  Physical Activity:   . Days of Exercise per Week: Not on file  . Minutes of Exercise per Session: Not on file  Stress:   . Feeling of Stress : Not on file  Social Connections:   . Frequency of Communication with Friends and Family: Not on file  . Frequency of Social Gatherings with Friends and Family: Not on file  . Attends Religious Services: Not on file  . Active Member of Clubs or Organizations: Not on file  . Attends BankerClub or Organization Meetings: Not on file  . Marital Status: Not on file   Additional Social History:    Allergies:   Allergies  Allergen Reactions  . Penicillins Other (See Comments)    Seizure when he was 19    Labs: No results found for this or any previous visit (from the past 48 hour(s)).  Current Facility-Administered Medications  Medication Dose Route Frequency Provider Last Rate Last Admin  . aspirin EC tablet 81 mg  81 mg Oral Daily Willy Eddyobinson, Patrick, MD      . atomoxetine (STRATTERA) capsule 40 mg  40 mg Oral q morning - 10a Willy Eddyobinson, Patrick, MD      . benztropine (COGENTIN) tablet 1 mg  1 mg Oral QHS Willy Eddyobinson, Patrick, MD      . cholecalciferol (VITAMIN D3) tablet 2,000 Units  2,000 Units Oral Daily Willy Eddyobinson, Patrick, MD      . HYDROcodone-acetaminophen (NORCO/VICODIN) 5-325 MG per tablet 1 tablet  1 tablet Oral Q8H PRN Willy Eddyobinson, Patrick, MD      . lithium carbonate capsule 300 mg  300 mg Oral QHS Willy Eddyobinson, Patrick, MD      . lithium carbonate capsule 450 mg  450 mg Oral BID WC Willy Eddyobinson, Patrick, MD      . methocarbamol (ROBAXIN) tablet 500 mg  500 mg Oral Q6H PRN Willy Eddyobinson, Patrick, MD      . risperiDONE (RISPERDAL) tablet 3 mg  3 mg Oral QHS Willy Eddyobinson, Patrick, MD       Current Outpatient Medications  Medication Sig Dispense Refill  . aspirin EC 81  MG EC tablet Take 1 tablet (81 mg total) by mouth daily. Swallow whole. 30 tablet 0  . atomoxetine (STRATTERA) 40 MG capsule Take 40 mg by mouth every morning.     . benztropine (COGENTIN) 1 MG tablet Take 1 mg by mouth at bedtime.     . cholecalciferol (VITAMIN D) 25 MCG tablet Take 2 tablets (2,000 Units total) by mouth daily. 60 tablet 2  . HYDROcodone-acetaminophen (NORCO/VICODIN) 5-325 MG tablet Take 1 tablet by mouth every 8 (eight) hours as needed  for severe pain. 10 tablet 0  . lithium carbonate 150 MG capsule Take 3 capsules (450 mg total) by mouth 2 (two) times daily with a meal. 80 capsule 0  . lithium carbonate 300 MG capsule Take 1 capsule (300 mg total) by mouth at bedtime. 14 capsule 0  . methocarbamol (ROBAXIN) 500 MG tablet Take 1 tablet (500 mg total) by mouth every 6 (six) hours as needed for muscle spasms. 20 tablet 0  . risperiDONE (RISPERDAL) 3 MG tablet Take 1 tablet (3 mg total) by mouth at bedtime. 30 tablet 1    Musculoskeletal: Strength & Muscle Tone: within normal limits Gait & Station: normal Patient leans: N/A  Psychiatric Specialty Exam: Physical Exam Vitals and nursing note reviewed.  Constitutional:      Appearance: He is well-developed.  HENT:     Head: Normocephalic and atraumatic.  Eyes:     Conjunctiva/sclera: Conjunctivae normal.     Pupils: Pupils are equal, round, and reactive to light.  Cardiovascular:     Heart sounds: Normal heart sounds.  Pulmonary:     Effort: Pulmonary effort is normal.  Abdominal:     Palpations: Abdomen is soft.  Musculoskeletal:        General: Normal range of motion.       Arms:     Cervical back: Normal range of motion.  Skin:    General: Skin is warm and dry.  Neurological:     General: No focal deficit present.     Mental Status: He is alert.  Psychiatric:        Attention and Perception: He is inattentive.        Mood and Affect: Affect is blunt.        Speech: He is noncommunicative.        Behavior:  Behavior is withdrawn.        Thought Content: Thought content is paranoid and delusional.        Cognition and Memory: Cognition is impaired. Memory is impaired.        Judgment: Judgment is impulsive and inappropriate.     Review of Systems  Constitutional: Negative.   HENT: Negative.   Eyes: Negative.   Respiratory: Negative.   Cardiovascular: Negative.   Gastrointestinal: Negative.   Musculoskeletal: Negative.   Skin: Negative.   Neurological: Negative.   Psychiatric/Behavioral: Positive for behavioral problems and confusion.    Blood pressure 127/80, pulse 88, temperature 99.2 F (37.3 C), temperature source Oral, resp. rate 18, height 5\' 11"  (1.803 m), weight 90.7 kg, SpO2 100 %.Body mass index is 27.89 kg/m.  General Appearance: Casual  Eye Contact:  None  Speech:  Blocked  Volume:  Decreased  Mood:  Negative  Affect:  Negative  Thought Process:  NA  Orientation:  Negative  Thought Content:  Negative  Suicidal Thoughts:  Unknown  Homicidal Thoughts:  Unknown  Memory:  Immediate;   Poor Recent;   Poor Remote;   Poor  Judgement:  Impaired  Insight:  Lacking  Psychomotor Activity:  Decreased  Concentration:  Concentration: Poor  Recall:  Poor  Fund of Knowledge:  Poor  Language:  Negative  Akathisia:  Negative  Handed:  Right  AIMS (if indicated):     Assets:  Housing Resilience Social Support  ADL's:  Impaired  Cognition:  Impaired,  Mild  Sleep:        Treatment Plan Summary: Daily contact with patient to assess and evaluate symptoms and progress in treatment, Medication management  and Plan Patient with schizoaffective disorder.  On the plus side he generally has good health at baseline although now he is recovering from a broken arm.  He has act team services he has family support and he has a history of good response to medicine and good function in the past.  Currently however he has been noncompliant with treatment persistently psychotic agitated and  aggressive at times.  Patient requires inpatient hospitalization for further stability.  Orders will be placed for his previously recommended outpatient psychiatric medicine.  Lithium level still pending.  Patient will be rechecked when he is willing to talk to see if he needs anything else.  It looks like from the notes he does not need the arm in a cast but may need a sling for proper treatment but we will follow-up with that once he wakes up.  Continue IVC and refer for inpatient psychiatric treatment  Disposition: Recommend psychiatric Inpatient admission when medically cleared. Supportive therapy provided about ongoing stressors.  Mordecai Rasmussen, MD 07/06/2020 10:14 AM

## 2020-07-06 NOTE — ED Notes (Signed)
Pt is laying in bed speaking loudly to himself.  Pt will not allow his door to be closed.

## 2020-07-06 NOTE — ED Notes (Signed)
RN spoke with Dr. Toni Amend about patient becoming agitated and wanting to leave.  Verbal orders for 2mg  of Ativan PO    Or    2 Ativan, 5 Haldol, 50 Benadryl IM if needed.

## 2020-07-06 NOTE — BH Assessment (Signed)
Assessment Note  Upon exam, patient is awake and oriented to self and place. However, therapist was unable to fully assess patient due to patient having blank stare and was not vocal when asked questions.    Ernest Cook is an 29 y.o. male presenting involuntarily to [facility] ED because ACT Team has concerns about his mental status. ACT Team was able to provide collateral information stating patient was agitated and irritable. Per ACT Team, patient is delusional in thinking and believes people have broken into his home and trying to kill him. In order to protect himself, patient was walking down the street with knife threatening to harm others. Per ACT Team, patient also reports aliens are entering into his home and he would rather walk around the neighborhood in which he was knocking on the doors of family members during early morning hours.   Diagnosis: Schizo affective schizophrenia [facility])  Past Medical History:  Past Medical History:  Diagnosis Date  . Schizo affective schizophrenia [facility])     Past Surgical History:  Procedure Laterality Date  . ORIF HUMERUS FRACTURE Right 06/27/2020   Procedure: OPEN REDUCTION INTERNAL FIXATION (ORIF) HUMERAL SHAFT FRACTURE;  Surgeon: Roby Lofts, MD;  Location: [facility] OR;  Service: Orthopedics;  Laterality: Right;  . SKIN GRAFT Left unknown   Pt reports left foot and leg skin graft    Family History:  Family History  Problem Relation Age of Onset  . Diabetes Mother   . Hypertension Mother     Social History:  reports that he has been smoking cigarettes. He has been smoking about 0.25 packs per day. He has never used smokeless tobacco. He reports current drug use. Drug: Marijuana. He reports that he does not drink alcohol.  Additional Social History:  Alcohol / Drug Use Pain Medications: See MAR Prescriptions: See MAR Over the Counter: See MAR History of alcohol / drug use?: No history of alcohol / drug abuse  CIWA: CIWA-Ar BP: 127/80 Pulse  Rate: 88 COWS:    Allergies:  Allergies  Allergen Reactions  . Penicillins Other (See Comments)    Seizure when he was 19    Home Medications: (Not in a hospital admission)   OB/GYN Status:  No LMP for male patient.  General Assessment Data Location of Assessment: [facility] ED TTS Assessment: In system Is this a Tele or Face-to-Face Assessment?: Face-to-Face Is this an Initial Assessment or a Re-assessment for this encounter?: Initial Assessment Patient Accompanied by:: N/A Language Other than English: No Living Arrangements: Other (Comment) (Private home) What gender do you identify as?: Male Date Telepsych consult ordered in [facility]: 07/06/20 Time Telepsych consult ordered in [facility]: 0630 Marital status: Single Pregnancy Status: No Living Arrangements: Alone Can pt return to current living arrangement?: Yes Admission Status: Involuntary Petitioner: Other (ACT Team) Is patient capable of signing voluntary admission?: No Referral Source: Other (ACT Team) Insurance type: Medicaid  Medical Screening Exam [facility] Walk-in ONLY) Medical Exam completed: Yes  Crisis Care Plan Living Arrangements: Alone Legal Guardian: Other: (Self) Name of Psychiatrist: Frederich Chick ACT Team Name of Therapist: Frederich Chick ACT team  Education Status Is patient currently in school?: No Is the patient employed, unemployed or receiving disability?: Unemployed, Receiving disability income  Risk to self with the past 6 months Suicidal Ideation: No (Unable to assess) Has patient been a risk to self within the past 6 months prior to admission? : No Suicidal Intent: No (Unable to assess) Has patient had any suicidal intent within the past  6 months prior to admission? : No Is patient at risk for suicide?: No Suicidal Plan?: No Has patient had any suicidal plan within the past 6 months prior to admission? : No Access to Means: No What has been your use of drugs/alcohol within the last 12 months?: Unable to  assess Previous Attempts/Gestures: No How many times?: 0 Other Self Harm Risks: Unable to assess Triggers for Past Attempts: Unknown Intentional Self Injurious Behavior: None Family Suicide History: Unable to assess Recent stressful life event(s):  (Unable to assess) Persecutory voices/beliefs?:  (Unable to assess) Depression: No Depression Symptoms:  (Unable to assess) Substance abuse history and/or treatment for substance abuse?: No Suicide prevention information given to non-admitted patients: Not applicable  Risk to Others within the past 6 months Homicidal Ideation: Yes-Currently Present Does patient have any lifetime risk of violence toward others beyond the six months prior to admission? : Yes (comment) Thoughts of Harm to Others: Yes-Currently Present Comment - Thoughts of Harm to Others: Yes, per ACT Team Current Homicidal Intent: Yes-Currently Present Current Homicidal Plan: Yes-Currently Present Describe Current Homicidal Plan: Patient had knife with plans to stab someone. Access to Homicidal Means: Yes Describe Access to Homicidal Means: Access to weapons Identified Victim: No specific person History of harm to others?: Yes Assessment of Violence: None Noted Violent Behavior Description: Per ACT Team, confrontational and verbally abusive to staff  Does patient have access to weapons?: Yes (Comment) Criminal Charges Pending?: No Does patient have a court date: No Is patient on probation?: No  Psychosis Hallucinations: Auditory, Visual Delusions: Persecutory  Mental Status Report Appearance/Hygiene: Poor hygiene Eye Contact: Good Motor Activity: Freedom of movement Speech: Logical/coherent Level of Consciousness: Alert, Quiet/awake Mood:  (Unable to assess) Affect: Unable to Assess Anxiety Level: None Thought Processes: Thought Blocking Judgement: Unable to Assess Orientation: Person, Place, Situation, Appropriate for developmental age Obsessive Compulsive  Thoughts/Behaviors: Unable to Assess  Cognitive Functioning Concentration: Unable to Assess Memory: Unable to Assess Is patient IDD: No Insight: Poor Impulse Control: Poor Appetite:  (Unable to assess) Have you had any weight changes? :  (Unable to assess) Sleep: Unable to Assess Total Hours of Sleep:  (Unable to assess) Vegetative Symptoms: None  ADLScreening [facility] Assessment Services) Patient's cognitive ability adequate to safely complete daily activities?: Yes Patient able to express need for assistance with ADLs?: Yes Independently performs ADLs?: Yes (appropriate for developmental age)  Prior Inpatient Therapy Prior Inpatient Therapy: Yes Prior Therapy Dates: 03/2020 & 03/2016 Prior Therapy Facilty/Provider(s): [facility] Reason for Treatment: Schizophrenia  Prior Outpatient Therapy Prior Outpatient Therapy: Yes Prior Therapy Dates: Current Prior Therapy Facilty/Provider(s): [facility] ACT team Reason for Treatment: Schizophrenia Does patient have an ACCT team?: Yes Does patient have Intensive In-House Services?  : No Does patient have [facility] services? : No Does patient have P4CC services?: No  ADL Screening (condition at time of admission) Patient's cognitive ability adequate to safely complete daily activities?: Yes Is the patient deaf or have difficulty hearing?: No Does the patient have difficulty seeing, even when wearing glasses/contacts?: No Does the patient have difficulty concentrating, remembering, or making decisions?: No Patient able to express need for assistance with ADLs?: Yes Does the patient have difficulty dressing or bathing?: No Independently performs ADLs?: Yes (appropriate for developmental age) Does the patient have difficulty walking or climbing stairs?: No Weakness of Legs: None Weakness of Arms/Hands: None  Home Assistive Devices/Equipment Home Assistive Devices/Equipment: None  Therapy Consults (therapy consults require a physician  order) PT Evaluation Needed:  No OT Evalulation Needed: No SLP Evaluation Needed: No Abuse/Neglect Assessment (Assessment to be complete while patient is alone) Abuse/Neglect Assessment Can Be Completed: Unable to assess, patient is non-responsive or altered mental status Self-Neglect: Yes, present (Comment) (Per ACT Team) Values / Beliefs Cultural Requests During Hospitalization: None Spiritual Requests During Hospitalization: None Consults Spiritual Care Consult Needed: No Transition of Care Team Consult Needed: No Advance Directives (For Healthcare) Does Patient Have a Medical Advance Directive?: No Would patient like information on creating a medical advance directive?: No - Patient declined          Disposition:  Disposition Initial Assessment Completed for this Encounter: Yes  On Site Evaluation by:   Reviewed with Physician:    Clerance Lav, MS, LCAS-A 07/06/2020 4:40 PM

## 2020-07-07 MED ORDER — RISPERIDONE 1 MG/ML PO SOLN
3.0000 mg | Freq: Once | ORAL | Status: AC
  Administered 2020-07-07: 3 mg via ORAL
  Filled 2020-07-07: qty 3

## 2020-07-07 MED ORDER — LITHIUM CITRATE 300 MG/5 ML PO SYRP
300.0000 mg | Freq: Once | ORAL | Status: AC
  Administered 2020-07-07: 300 mg via ORAL
  Filled 2020-07-07: qty 5

## 2020-07-07 MED ORDER — RISPERIDONE 1 MG/ML PO SOLN
3.0000 mg | Freq: Every day | ORAL | Status: DC
  Administered 2020-07-07 – 2020-07-31 (×25): 3 mg via ORAL
  Filled 2020-07-07 (×32): qty 3

## 2020-07-07 MED ORDER — DIPHENHYDRAMINE HCL 12.5 MG/5ML PO ELIX
50.0000 mg | ORAL_SOLUTION | Freq: Once | ORAL | Status: AC
  Administered 2020-07-07: 50 mg via ORAL
  Filled 2020-07-07: qty 20

## 2020-07-07 NOTE — ED Notes (Signed)
Pt ate all of lunch meal tray excluding the applesauce that had his morning medication in it. Pt refused applesauce. Pt sitting on the side of the bed at this time.

## 2020-07-07 NOTE — ED Notes (Signed)
NO V/S or SUICIDE ASSESSMENT d/t high violence risk

## 2020-07-07 NOTE — BH Assessment (Signed)
Patient referred to [facility], pending review.    State Referral Form and supporting documentation completed and faxed to [employer].   Wrier called [employer] for the Authorization/Tracking # but was unabl to get anyone in that department. The operator advised to leave a voicemail message.    19:05-Verbal screening completed with [facility]([phone]), information faxed and confirmed it was received. Will need the tracking number and labs to complete the referral and to be consider for the wait list.

## 2020-07-07 NOTE — ED Notes (Signed)
Zach, NT at bedside to attempt bloodwork, pt states, "I want a woman to do it because my pimp is a woman and I have to listen to my pimp." This RN at bedside to trying to attempt to get blood work. Pt speaking and rambling about information that did pertain to situation. Pt states, "You not my pimp, we don't give no bloodwork to nobody. We pregnant and we don't do no bloodwork" repeatedly speaking to this RN and the other voices.

## 2020-07-07 NOTE — ED Notes (Addendum)
Dr. Larinda Buttery, MD at bedside. Pressured speech and loudly speaking towards Dr. Larinda Buttery, MD at this time. Pt continuously rambling and speaking louder at individuals/voices in the room who are not there. Emotions fluctuating and pt has a moment of crying when speaking to these voices.

## 2020-07-07 NOTE — ED Notes (Signed)
Pt heard crying and talking, pt without any interaction with staff outside of room, transitioned back to loud conversation speech within 3 minutes

## 2020-07-07 NOTE — ED Notes (Addendum)
as ordered by Timoteo Expose and april charge -- ok for pt not to be dressed out and avoid conflict d/t high risk of violence - holding on med clearance orders

## 2020-07-07 NOTE — ED Notes (Signed)
This tech attempted to draw pt's blood, pt refused saying he wanted a woman because his pimp is a woman, Glass blower/designer notified.

## 2020-07-07 NOTE — ED Notes (Signed)
Spoke with Dr. Deberah Castle, MD regarding failure to get bloodwork. See orders.

## 2020-07-07 NOTE — ED Provider Notes (Signed)
Emergency Medicine Observation Re-evaluation Note  Ernest Cook is a 29 y.o. male, seen on rounds today.  Pt initially presented to the ED for complaints of Mental Health Problem Currently, the patient is calm, resting.  Physical Exam  BP 127/80 (BP Location: Left Arm)   Pulse 88   Temp 99.2 F (37.3 C) (Oral)   Resp 18   Ht 5\' 11"  (1.803 m)   Wt 90.7 kg   SpO2 100%   BMI 27.89 kg/m  Physical Exam General: Calm, resting Cardiac: Well perfused Lungs: Normal WOB Psych: Calm  ED Course / MDM  EKG:    I have reviewed the labs performed to date as well as medications administered while in observation.  Recent changes in the last 24 hours include : willing to take risperdal/lithium this AM, still refusing labs however.  Plan  Current plan is for psych dispo. Patient is under full IVC at this time.   , MD 07/07/20 [id number]

## 2020-07-07 NOTE — ED Notes (Addendum)
April Charge aware that no positive interventions to move pt's care forward continuing with pt, EDP and charge aware of this RN's plan to see if Hewan, RN in [place] can partner with pt for nourishment or care  Several attempts on part of Hewan, RN to get pt to take meds or even drink fluids with meds in -- all unsuccessful, pt reports to Hewan "I have those meds at home"

## 2020-07-07 NOTE — ED Notes (Signed)
Breakfast tray given at this time. Pt eating breakfast at this time. Pt still is speaking loudly to individuals that are not in the room

## 2020-07-07 NOTE — ED Notes (Signed)
Hourly rounding completed at this time, patient currently awake in room. No complaints, stable, and in no acute distress. Q15 minute rounds and monitoring via Rover and Officer to continue. °

## 2020-07-07 NOTE — ED Notes (Signed)
Pt continues to alternate between talking loudly to self and sitting quietly, pt refused refreshment from this RN

## 2020-07-07 NOTE — ED Notes (Signed)
Dinner meal tray given at this time.  

## 2020-07-07 NOTE — ED Notes (Addendum)
Pt is awake at this time, refuses to take any pills. Pt was provided with drink and meds in cup to room, left with pt. Pt had just sat down on bed after urinating in floor This nurse cleans floor up and pt sits on bed. Continues to refuse vitals and labs. Will continue to monitor.

## 2020-07-07 NOTE — ED Notes (Signed)
Discussed POC with Dr Derrill Kay; hold on meds and lab work until pt is more stabile or becomes unstable enough to absolutely necessitate interventions

## 2020-07-07 NOTE — ED Notes (Addendum)
Spoke with Dr. Lenard Lance, MD r/t plan of care for this pt. Pt is still speaking loudly to individuals/voices in the room. Orders to give liquid Benadryl and try to obtain bloodwork after the effects of the benadryl start to work.

## 2020-07-07 NOTE — ED Notes (Signed)
Hourly rounding completed at this time, patient currently asleep in room. No complaints, stable, and in no acute distress. Q15 minute rounds and monitoring via Rover and Officer to continue. 

## 2020-07-07 NOTE — ED Notes (Signed)
Pt continues to sit on the bed edge and talk loudly

## 2020-07-07 NOTE — ED Notes (Signed)
Pt agreed to have vitals obtained during assessment, since nurse exits and Dinamap is available, pt has laid down in bed and fallen asleep. Pt has not slept in 24 hours per reports and has sat on side of bed for whole shift prior so this nurse will allow pt to rest at this time. Vitals will be obtained when pt is awake

## 2020-07-07 NOTE — ED Notes (Addendum)
After EDP approved call to pharm to change pt meds from pill to liquid in order make it easier for pt to take meds  Pt still refusing refreshments from this pt

## 2020-07-07 NOTE — ED Notes (Signed)
Lunch meal tray given at this time.  

## 2020-07-07 NOTE — ED Notes (Signed)
Pt continues to lay on back in bed with arms crossed. Pt staring blankly at ceiling

## 2020-07-07 NOTE — ED Notes (Signed)
Another unsuccessful attempt to establish rapport with pt  Pt refusing nourishment and meds

## 2020-07-07 NOTE — ED Notes (Signed)
Report received from Ashley, RN including Situation, Background, Assessment, and Recommendations. Patient alert and oriented, warm and dry, and in no acute distress. Patient denies SI, HI, AVH and pain. Patient made aware of Q15 minute rounds and Rover and Officer presence for their safety. Patient instructed to come to this nurse with needs or concerns. 

## 2020-07-07 NOTE — ED Notes (Addendum)
Pt continues to sit on the edge of bed and talk loudly to self, pt at times laughing and other times cursing, unable to follow the speech intent, appears to be directed to someone about past events - almost like pt is talking on the telephone

## 2020-07-07 NOTE — ED Notes (Addendum)
Spoke with Dr. Larinda Buttery, MD at this time r/t pt's plan of care. Pt seems a little more cooperative since he is eating our meals. Per Dr. Larinda Buttery we will reevaluate at lunch time and see if we could give him more liquid lithium to help him better cooperate.

## 2020-07-07 NOTE — ED Notes (Signed)
Report received from [place], [place]. Pt currently speaking loudly at the edge of the bed at this time.

## 2020-07-07 NOTE — ED Notes (Signed)
Pt has not gotten up from the spot he has been sitting in since 0700 this morning. Refuse to get up.

## 2020-07-07 NOTE — ED Notes (Addendum)
Pt had requested chapstick but then refused lip moisturizer/oral care, pt reports "I've been to prison - you ain't gonna do me like that" pt's voice has taken an exaggerated feminine affect, pt oriented to the benefits of oral care and refused again "that's for little kids! I have a son!"    Pt finished drinking all of fluids given with meds in it

## 2020-07-07 NOTE — ED Notes (Addendum)
This RN at bedside to attempted to give morning medications. Pt states, "baby, you know that is to much medication." Offered pt water and pt took water but would not take pills. Tried to offer pt applesauce with medication pt states, "no you want to fight, got to get your weight class up." This RN has lost rapport with pt at this time and asked Ers[employer]ine [employer], Consulting civil engineer if she would like to try offering the applesauce. Holding applesauce with medication until lunch time at this time. Pt still uncooperative and speaking loudly at this time. Unable to get V/S or morning medication at this time.

## 2020-07-07 NOTE — ED Notes (Signed)
Called Pharmacy on the status of the lithium citrate, states they will send it up shortly

## 2020-07-07 NOTE — ED Notes (Signed)
Patient is IVC pending placement 

## 2020-07-07 NOTE — ED Notes (Signed)
Pt finally consented to receive fluids from this RN "I want everything that's coming to me, All of it! My lips are tore up!"  Pt given fluids with meds added

## 2020-07-08 MED ORDER — LITHIUM CITRATE 300 MG/5 ML PO SYRP
300.0000 mg | Freq: Three times a day (TID) | ORAL | Status: DC
  Administered 2020-07-08 – 2020-07-22 (×37): 300 mg via ORAL
  Filled 2020-07-08 (×50): qty 5

## 2020-07-08 NOTE — ED Notes (Signed)
Pt in room, laying on right side in bed and laughing with his face toward the mattress

## 2020-07-08 NOTE — ED Notes (Signed)
This tech and Ernest Cook, Charity fundraiser assessed pt vs. Shower and new scrubs were offered. Pt did not responded back to staff.

## 2020-07-08 NOTE — ED Notes (Signed)
Hourly rounding completed at this time, patient currently asleep in room. No complaints, stable, and in no acute distress. Q15 minute rounds and monitoring via Rover and Officer to continue. 

## 2020-07-08 NOTE — ED Notes (Signed)
Pt is awake in room at this time, laying in bed, and talking off and on. Unable at this time to determine what is being said

## 2020-07-08 NOTE — ED Notes (Signed)
Pt refusing vitals at this time. Pt encouraged to report any pain or discomfort to this tech or [employer].

## 2020-07-08 NOTE — ED Notes (Signed)
Pt given lunch tray.

## 2020-07-08 NOTE — ED Notes (Signed)
Pt states he will check his own vitals and will let this RN know what they are. Pt calm and standing at doorway. Pt denies any complaints at this time.  Unable to collect vitals at this time. Will continue to monitor pt. No distress noted and respirations even and unlabored.

## 2020-07-08 NOTE — ED Notes (Signed)
Hourly rounding completed at this time, patient currently awake in room, pt is staring with his face facing the mattress. No complaints, stable, and in no acute distress. Q15 minute rounds and monitoring via Psychologist, counselling to continue.

## 2020-07-08 NOTE — ED Notes (Signed)
Pt is not dressed out at this time, after reviewing notes from this visit, Tammy Sours, ER Director and prior charge nurses are aware and pt has been screened by security and officers. Pt is sitting on site of bed at this time as reported for last 12 hours.

## 2020-07-08 NOTE — ED Provider Notes (Signed)
Emergency Medicine Observation Re-evaluation Note  Ernest Cook is a 29 y.o. male, seen on rounds today.  Pt initially presented to the ED for complaints of Mental Health Problem Currently, the patient is resting quietly.  Physical Exam  BP 127/80 (BP Location: Left Arm)   Pulse 88   Temp 99.2 F (37.3 C) (Oral)   Resp 18   Ht 5\' 11"  (1.803 m)   Wt 90.7 kg   SpO2 100%   BMI 27.89 kg/m  Physical Exam Vitals and nursing note reviewed.  HENT:     Head: Normocephalic and atraumatic.     Right Ear: External ear normal.     Left Ear: External ear normal.     Nose: Nose normal.  Cardiovascular:     Rate and Rhythm: Normal rate.  Pulmonary:     Effort: Pulmonary effort is normal.  Abdominal:     General: There is no distension.  Neurological:     General: No focal deficit present.     Mental Status: He is alert.      ED Course / MDM  EKG:    I have reviewed the labs performed to date as well as medications administered while in observation.  Recent changes in the last 24 hours include none.  Plan  Current plan is for awaiting psych dispo. Patient is under full IVC at this time.   , MD 07/08/20 [phone]

## 2020-07-08 NOTE — ED Notes (Signed)
Dinner tray given

## 2020-07-08 NOTE — ED Notes (Signed)
Report received, care of pt assumed.  Pt resting on low bed, eyes closed, respirations even and nonlabored.  Will continue to monitor.

## 2020-07-08 NOTE — ED Notes (Signed)
Pt standing at his doorway, speaking/rambling nonstop to officer.  Pt appears to be looking in other directions and having conversations with others when no ne else is around.  Pt in and out of room.  Officer at [employer]

## 2020-07-08 NOTE — ED Notes (Signed)
Pt pacing/walking in circles in room and out the door then back to his room.  Pt offered water and refused.

## 2020-07-08 NOTE — ED Notes (Signed)
Pt pacing in doorway talking louder and to multiple people that are not present.  Pt talking in a non stop manner for last 30 mins.  Security remains close by and pt is having conversation in their direction, but not speaking directly to them.  Pt has escalated, but is not elevated to a point of needing medication at this time.  Will continue to monitor closely.  Will obtain afternoon dose of lithium from pharmacy and attempt to provide earlier.

## 2020-07-08 NOTE — ED Notes (Signed)
Into room to give AM meds, when this RN asked pt if he would be willing to take his meds he asked "what's in there".  Nurse reviewed medications with pt and he responded "I'm gonna wait for her to give them to me."  Pt sitting on side of bed, talking softly to himself.  Will continue to monitor.

## 2020-07-08 NOTE — ED Notes (Signed)
Pt  Awake at this time, ambulates to door, collects food that he had not eating that was in room and is now eating food in the bed.

## 2020-07-08 NOTE — ED Notes (Signed)
Breakfast tray given. °

## 2020-07-08 NOTE — ED Notes (Addendum)
Pt has not been into bathroom today.  Area of floor noted to be yellowish and sticky.  Pt smells of urine.  Pt offered shower and change of clothes.  Pt declines same at this time.  Pt has been ambulatory in room in periods today.  Pt remains suspicious of staff.  Pt continues to talk to himself softly and to interact with people that are not in room.

## 2020-07-08 NOTE — ED Notes (Signed)
Pt continues to pace in room and talk to people that are not there.  Pt mildly calmer at this time and is in doorway less.  Will continue to monitor.

## 2020-07-09 LAB — CBC WITH DIFFERENTIAL/PLATELET
Abs Immature Granulocytes: 0.05 10*3/uL (ref 0.00–0.07)
Basophils Absolute: 0 10*3/uL (ref 0.0–0.1)
Basophils Relative: 0 %
Eosinophils Absolute: 0.5 10*3/uL (ref 0.0–0.5)
Eosinophils Relative: 5 %
HCT: 38.7 % — ABNORMAL LOW (ref 39.0–52.0)
Hemoglobin: 12 g/dL — ABNORMAL LOW (ref 13.0–17.0)
Immature Granulocytes: 1 %
Lymphocytes Relative: 30 %
Lymphs Abs: 2.9 10*3/uL (ref 0.7–4.0)
MCH: 26.8 pg (ref 26.0–34.0)
MCHC: 31 g/dL (ref 30.0–36.0)
MCV: 86.4 fL (ref 80.0–100.0)
Monocytes Absolute: 0.9 10*3/uL (ref 0.1–1.0)
Monocytes Relative: 10 %
Neutro Abs: 5.2 10*3/uL (ref 1.7–7.7)
Neutrophils Relative %: 54 %
Platelets: 436 10*3/uL — ABNORMAL HIGH (ref 150–400)
RBC: 4.48 MIL/uL (ref 4.22–5.81)
RDW: 14.4 % (ref 11.5–15.5)
WBC: 9.5 10*3/uL (ref 4.0–10.5)
nRBC: 0 % (ref 0.0–0.2)

## 2020-07-09 LAB — COMPREHENSIVE METABOLIC PANEL
ALT: 35 U/L (ref 0–44)
AST: 37 U/L (ref 15–41)
Albumin: 4 g/dL (ref 3.5–5.0)
Alkaline Phosphatase: 69 U/L (ref 38–126)
Anion gap: 11 (ref 5–15)
BUN: 17 mg/dL (ref 6–20)
CO2: 24 mmol/L (ref 22–32)
Calcium: 8.8 mg/dL — ABNORMAL LOW (ref 8.9–10.3)
Chloride: 104 mmol/L (ref 98–111)
Creatinine, Ser: 0.86 mg/dL (ref 0.61–1.24)
GFR, Estimated: >60 mL/min (ref >60)
Glucose, Bld: 97 mg/dL (ref 70–99)
Potassium: 3.9 mmol/L (ref 3.5–5.1)
Sodium: 139 mmol/L (ref 135–145)
Total Bilirubin: 0.6 mg/dL (ref 0.3–1.2)
Total Protein: 7.1 g/dL (ref 6.5–8.1)

## 2020-07-09 LAB — ETHANOL: Alcohol, Ethyl (B): <10 mg/dL (ref <10)

## 2020-07-09 LAB — LITHIUM LEVEL: Lithium Lvl: 0.56 mmol/L — ABNORMAL LOW (ref 0.60–1.20)

## 2020-07-09 LAB — SALICYLATE LEVEL: Salicylate Lvl: <7 mg/dL — ABNORMAL LOW (ref 7.0–30.0)

## 2020-07-09 LAB — ACETAMINOPHEN LEVEL: Acetaminophen (Tylenol), Serum: <10 ug/mL — ABNORMAL LOW (ref 10–30)

## 2020-07-09 NOTE — ED Notes (Signed)
Pt refuses vitals  

## 2020-07-09 NOTE — ED Notes (Signed)
Pt consented to changed clothes then would not, pt did how ever use Deoderant and mouth care supplies provided with more cups of water

## 2020-07-09 NOTE — ED Notes (Signed)
While this nurse was attempting to assess pt, pt stayed in bed and did not move, pt stared at nurse blankly and spoke no words or motioned any to interact. Will continue to assess

## 2020-07-09 NOTE — Consult Note (Addendum)
[facility] Face-to-Face Psychiatry Consult   Reason for Consult: Consult for 29 year old man with a history of perennial or schizoaffective disorder brought into the hospital agitated and psychotic Referring Physician: Marcello Moores Patient Identification: Ernest Cook MRN:  [id number] Principal Diagnosis: Schizoaffective disorder, bipolar type (HCC) Diagnosis:  Principal Problem:   Schizoaffective disorder, bipolar type (HCC) Active Problems:   Noncompliance   Broken arm, right, closed, initial encounter   Total Time spent with patient: 1 hour  Subjective:   Ernest Cook is a 29 y.o. male patient admitted with "there is just things at my house".  Client continues to have an odd affect with impulsivity to run or leave the ED.  Haldol/Ativan/Benadryl appears to be most effective during these times.  Sometimes he will take the oral without the need of injections.  He was upset that he wanted to go home and take his medications there.  His Lithium levels were decreased on admission after not taking his medications.  This was restarted along with his other medications, including the addition of vitamin D since his levels were very low.  Due to his aggression and history of noncompliance, remains on the [facility] wait list and will coordinate a long-term injectable with his ACT team.  HPI: Patient seen chart reviewed. 29 year old man with a history of schizoaffective disorder came into the emergency room over the weekend. Agitated at the time he attempted to elope required physical restraints sustained unfortunately a fracture to his right arm. On interview today the patient is still disorganized and paranoid. Talks about how there are people breaking into his house and trying to harm him outside the hospital. Rambling story does not hold together make much sense. Patient admits he has been noncompliant with his medicine. Admits to cannabis use.  Past Psychiatric History: Past history of schizoaffective disorder and  at times in the past have been quite stable on injectable medicine but recently has decompensated with multiple hospitalizations or visits to the emergency room. He has an act team. No past suicide attempts but there is a past history of aggression when psychotic  Risk to Self: Suicidal Ideation: No (Unable to assess) Suicidal Intent: No (Unable to assess) Is patient at risk for suicide?: No Suicidal Plan?: No Access to Means: No What has been your use of drugs/alcohol within the last 12 months?: Unable to assess How many times?: 0 Other Self Harm Risks: Unable to assess Triggers for Past Attempts: Unknown Intentional Self Injurious Behavior: None Risk to Others: Homicidal Ideation: Yes-Currently Present Thoughts of Harm to Others: Yes-Currently Present Comment - Thoughts of Harm to Others: Yes, per ACT Team Current Homicidal Intent: Yes-Currently Present Current Homicidal Plan: Yes-Currently Present Describe Current Homicidal Plan: Patient had knife with plans to stab someone. Access to Homicidal Means: Yes Describe Access to Homicidal Means: Access to weapons Identified Victim: No specific person History of harm to others?: Yes Assessment of Violence: None Noted Violent Behavior Description: Per ACT Team, confrontational and verbally abusive to staff  Does patient have access to weapons?: Yes (Comment) Criminal Charges Pending?: No Does patient have a court date: No Prior Inpatient Therapy: Prior Inpatient Therapy: Yes Prior Therapy Dates: 03/2020 & 03/2016 Prior Therapy Facilty/Provider(s): [facility] Reason for Treatment: Schizophrenia Prior Outpatient Therapy: Prior Outpatient Therapy: Yes Prior Therapy Dates: Current Prior Therapy Facilty/Provider(s): [facility] Reason for Treatment: Schizophrenia Does patient have an ACCT team?: Yes Does patient have Intensive In-House Services?  : No Does patient have [facility] services? : No  Does patient have P4CC services?:  No  Past Medical History:  Past Medical History:  Diagnosis Date  . Schizo affective schizophrenia [facility](HCC)     Past Surgical History:  Procedure Laterality Date  . ORIF HUMERUS FRACTURE Right 06/27/2020   Procedure: OPEN REDUCTION INTERNAL FIXATION (ORIF) HUMERAL SHAFT FRACTURE;  Surgeon: Roby LoftsHaddix, Kevin P, MD;  Location: [facility] OR;  Service: Orthopedics;  Laterality: Right;  . SKIN GRAFT Left unknown   Pt reports left foot and leg skin graft   Family History:  Family History  Problem Relation Age of Onset  . Diabetes Mother   . Hypertension Mother    Family Psychiatric  History: None reported Social History:  Social History   Substance and Sexual Activity  Alcohol Use No     Social History   Substance and Sexual Activity  Drug Use Yes  . Types: Marijuana   Comment: "every now and again"    Social History   Socioeconomic History  . Marital status: Single    Spouse name: Not on file  . Number of children: Not on file  . Years of education: Not on file  . Highest education level: Not on file  Occupational History  . Not on file  Tobacco Use  . Smoking status: Current Every Day Smoker    Packs/day: 0.25    Types: Cigarettes  . Smokeless tobacco: Never Used  Substance and Sexual Activity  . Alcohol use: No  . Drug use: Yes    Types: Marijuana    Comment: "every now and again"  . Sexual activity: Yes    Birth control/protection: Condom    Comment: Pt reports he is attracted to men  Other Topics Concern  . Not on file  Social History Narrative  . Not on file   Social Determinants of Health   Financial Resource Strain:   . Difficulty of Paying Living Expenses: Not on file  Food Insecurity:   . Worried About Programme researcher, broadcasting/film/videounning Out of Food in the Last Year: Not on file  . Ran Out of Food in the Last Year: Not on file  Transportation Needs:   . Lack of Transportation (Medical): Not on file  . Lack of Transportation (Non-Medical): Not on file  Physical Activity:   . Days of  Exercise per Week: Not on file  . Minutes of Exercise per Session: Not on file  Stress:   . Feeling of Stress : Not on file  Social Connections:   . Frequency of Communication with Friends and Family: Not on file  . Frequency of Social Gatherings with Friends and Family: Not on file  . Attends Religious Services: Not on file  . Active Member of Clubs or Organizations: Not on file  . Attends BankerClub or Organization Meetings: Not on file  . Marital Status: Not on file   Additional Social History:    Allergies:   Allergies  Allergen Reactions  . Penicillins Other (See Comments)    Seizure when he was 19    Labs:  Results for orders placed or performed during the hospital encounter of 07/06/20 (from the past 48 hour(s))  CBC with Differential     Status: Abnormal   Collection Time: 07/09/20  1:43 AM  Result Value Ref Range   WBC 9.5 4.0 - 10.5 K/uL   RBC 4.48 4.22 - 5.81 MIL/uL   Hemoglobin 12.0 (L) 13.0 - 17.0 g/dL   HCT 03.438.7 (L) 39 - 52 %   MCV 86.4 80.0 - 100.0  fL   MCH 26.8 26.0 - 34.0 pg   MCHC 31.0 30.0 - 36.0 g/dL   RDW 16.1 09.6 - 04.5 %   Platelets 436 (H) 150 - 400 K/uL   nRBC 0.0 0.0 - 0.2 %   Neutrophils Relative % 54 %   Neutro Abs 5.2 1.7 - 7.7 K/uL   Lymphocytes Relative 30 %   Lymphs Abs 2.9 0.7 - 4.0 K/uL   Monocytes Relative 10 %   Monocytes Absolute 0.9 0.1 - 1.0 K/uL   Eosinophils Relative 5 %   Eosinophils Absolute 0.5 0.0 - 0.5 K/uL   Basophils Relative 0 %   Basophils Absolute 0.0 0.0 - 0.1 K/uL   Immature Granulocytes 1 %   Abs Immature Granulocytes 0.05 0.00 - 0.07 K/uL    Comment: Performed at [facility], [address] [place], [place] [address]  Comprehensive metabolic panel     Status: Abnormal   Collection Time: 07/09/20  1:43 AM  Result Value Ref Range   Sodium 139 135 - 145 mmol/L   Potassium 3.9 3.5 - 5.1 mmol/L   Chloride 104 98 - 111 mmol/L   CO2 24 22 - 32 mmol/L   Glucose, Bld 97 70 - 99 mg/dL    Comment: Glucose  reference range applies only to samples taken after fasting for at least 8 hours.   BUN 17 6 - 20 mg/dL   Creatinine, Ser 1.91 0.61 - 1.24 mg/dL   Calcium 8.8 (L) 8.9 - 10.3 mg/dL   Total Protein 7.1 6.5 - 8.1 g/dL   Albumin 4.0 3.5 - 5.0 g/dL   AST 37 15 - 41 U/L   ALT 35 0 - 44 U/L   Alkaline Phosphatase 69 38 - 126 U/L   Total Bilirubin 0.6 0.3 - 1.2 mg/dL   GFR, Estimated >47 >82 mL/min    Comment: (NOTE) Calculated using the CKD-EPI Creatinine Equation (2021)    Anion gap 11 5 - 15    Comment: Performed at [facility], [address] [place], [place] [address]  Ethanol     Status: None   Collection Time: 07/09/20  1:43 AM  Result Value Ref Range   Alcohol, Ethyl (B) <10 <10 mg/dL    Comment: (NOTE) Lowest detectable limit for serum alcohol is 10 mg/dL.  For medical purposes only. Performed at [facility], [address] [place], [place] [address]   Acetaminophen level     Status: Abnormal   Collection Time: 07/09/20  1:43 AM  Result Value Ref Range   Acetaminophen (Tylenol), Serum <10 (L) 10 - 30 ug/mL    Comment: (NOTE) Therapeutic concentrations vary significantly. A range of 10-30 ug/mL  may be an effective concentration for many patients. However, some  are best treated at concentrations outside of this range. Acetaminophen concentrations >150 ug/mL at 4 hours after ingestion  and >50 ug/mL at 12 hours after ingestion are often associated with  toxic reactions.  Performed at [facility], [address] [place], [place] [address]   Salicylate level     Status: Abnormal   Collection Time: 07/09/20  1:43 AM  Result Value Ref Range   Salicylate Lvl <7.0 (L) 7.0 - 30.0 mg/dL    Comment: Performed at [facility], [address] [place], [place] [address]  Lithium level     Status: Abnormal   Collection Time: 07/09/20  1:43 AM  Result Value Ref Range   Lithium Lvl 0.56 (L) 0.60 -  1.20 mmol/L    Comment: Performed at  [facility], [address] [place], [place] [address]    Current Facility-Administered Medications  Medication Dose Route Frequency Provider Last Rate Last Admin  . aspirin EC tablet 81 mg  81 mg Oral Daily Willy Eddy, MD      . atomoxetine (STRATTERA) capsule 40 mg  40 mg Oral q morning - 10a Willy Eddy, MD      . benztropine (COGENTIN) tablet 1 mg  1 mg Oral QHS Willy Eddy, MD      . cholecalciferol (VITAMIN D3) tablet 2,000 Units  2,000 Units Oral Daily Willy Eddy, MD      . diphenhydrAMINE (BENADRYL) injection 50 mg  50 mg Intramuscular Q6H PRN Clapacs, John T, MD      . haloperidol lactate (HALDOL) injection 5 mg  5 mg Intramuscular Q6H PRN Clapacs, Jackquline Denmark, MD      . HYDROcodone-acetaminophen (NORCO/VICODIN) 5-325 MG per tablet 1 tablet  1 tablet Oral Q8H PRN Willy Eddy, MD      . lithium citrate 300 MG/5ML solution 300 mg  300 mg Oral TID Willy Eddy, MD   300 mg at 07/08/20 2248  . LORazepam (ATIVAN) tablet 2 mg  2 mg Oral Q6H PRN Clapacs, Jackquline Denmark, MD       Or  . LORazepam (ATIVAN) injection 2 mg  2 mg Intramuscular Q6H PRN Clapacs, John T, MD      . LORazepam (ATIVAN) tablet 2 mg  2 mg Oral Once Clapacs, John T, MD      . methocarbamol (ROBAXIN) tablet 500 mg  500 mg Oral Q6H PRN Willy Eddy, MD      . risperiDONE (RISPERDAL) 1 MG/ML oral solution 3 mg  3 mg Oral QHS Willy Eddy, MD   3 mg at 07/08/20 2248   Current Outpatient Medications  Medication Sig Dispense Refill  . aspirin EC 81 MG EC tablet Take 1 tablet (81 mg total) by mouth daily. Swallow whole. 30 tablet 0  . atomoxetine (STRATTERA) 40 MG capsule Take 40 mg by mouth every morning.     . benztropine (COGENTIN) 1 MG tablet Take 1 mg by mouth at bedtime.     . cholecalciferol (VITAMIN D) 25 MCG tablet Take 2 tablets (2,000 Units total) by mouth daily. 60 tablet 2  . HYDROcodone-acetaminophen (NORCO/VICODIN) 5-325 MG tablet Take 1 tablet by mouth every 8  (eight) hours as needed for severe pain. 10 tablet 0  . lithium carbonate 150 MG capsule Take 3 capsules (450 mg total) by mouth 2 (two) times daily with a meal. 80 capsule 0  . lithium carbonate 300 MG capsule Take 1 capsule (300 mg total) by mouth at bedtime. 14 capsule 0  . methocarbamol (ROBAXIN) 500 MG tablet Take 1 tablet (500 mg total) by mouth every 6 (six) hours as needed for muscle spasms. 20 tablet 0  . risperiDONE (RISPERDAL) 3 MG tablet Take 1 tablet (3 mg total) by mouth at bedtime. 30 tablet 1    Musculoskeletal: Strength & Muscle Tone: within normal limits Gait & Station: normal Patient leans: N/A  Psychiatric Specialty Exam: Physical Exam Vitals and nursing note reviewed.  Constitutional:      Appearance: He is well-developed.  HENT:     Head: Normocephalic and atraumatic.  Eyes:     Conjunctiva/sclera: Conjunctivae normal.     Pupils: Pupils are equal, round, and reactive to light.  Cardiovascular:     Heart sounds: Normal heart  sounds.  Pulmonary:     Effort: Pulmonary effort is normal.  Abdominal:     Palpations: Abdomen is soft.  Musculoskeletal:        General: Normal range of motion.     Cervical back: Normal range of motion.     Comments: Right arm is in a cast from a acute fracture of the right humerus  Skin:    General: Skin is warm and dry.  Neurological:     Mental Status: He is alert.  Psychiatric:        Attention and Perception: He is inattentive.        Mood and Affect: Mood is anxious. Affect is labile.        Speech: Speech is delayed.        Thought Content: Thought content is paranoid and delusional. Thought content does not include homicidal or suicidal ideation.        Cognition and Memory: Cognition is impaired. Memory is impaired.        Judgment: Judgment is impulsive and inappropriate.     Review of Systems  Constitutional: Negative.   HENT: Negative.   Eyes: Negative.   Respiratory: Negative.   Cardiovascular: Negative.    Gastrointestinal: Negative.   Musculoskeletal: Negative.        Mild to moderate pain in the right arm where he had a fracture  Skin: Negative.   Neurological: Negative.   Psychiatric/Behavioral: Positive for behavioral problems, confusion and sleep disturbance. The patient is hyperactive.     Blood pressure 122/78, pulse 69, temperature 97.9 F (36.6 C), temperature source Oral, resp. rate 16, height 5\' 11"  (1.803 m), weight 90.7 kg, SpO2 100 %.Body mass index is 27.89 kg/m.  General Appearance: Casual  Eye Contact:  Fair  Speech:  Garbled  Volume:  Decreased  Mood:  Anxious  Affect:  Constricted and Inappropriate  Thought Process:  Disorganized  Orientation:  Negative  Thought Content:  Illogical, Delusions and Paranoid Ideation  Suicidal Thoughts:  No  Homicidal Thoughts:  No  Memory:  Immediate;   Fair Recent;   Poor Remote;   Poor  Judgement:  Impaired  Insight:  Shallow  Psychomotor Activity:  Decreased  Concentration:  Concentration: Poor  Recall:  Fair  Fund of Knowledge:  Fair  Language:  Fair  Akathisia:  No  Handed:  Right  AIMS (if indicated):     Assets:  Housing Resilience Social Support  ADL's:  Impaired  Cognition:  Impaired,  Mild  Sleep:        Treatment Plan Summary: Daily contact with patient to assess and evaluate symptoms and progress in treatment, Medication management and Plan Patient with schizoaffective disorder was agitated aggressive dangerous in his behavior as recently as yesterday. Still disorganized in his thinking today. Requires inpatient treatment for safety. Continue under IVC. Orders in place for his previous dose of lithium and Risperdal. Working on finding appropriate bed availability. Case reviewed with emergency room doctor and TTS   Schizoaffective disorder, bipolar type: Continue Lithium 300 mg TID  -Ordered Lithium level Continue Risperdal 3 mg at bedtime  -Coordinate with ACT team about a long-term  injectable  Agitation medications: -Haldol 5 mg, Ativan 2 mg, and Benadryl 50 mg PRN (oral or IM)  EPS: -Continue Cogentin 1 mg daily  Attention/focus: -Continue Strattera 40 mg daily  Vitamin D deficiency: -continue vitamin D supplement  Disposition: Recommend psychiatric Inpatient admission when medically cleared. Supportive therapy provided about ongoing stressors. Discussed crisis plan,  support from social network, calling 911, coming to the Emergency Department, and calling Suicide Hotline.  Nanine Means, NP 07/09/2020 9:46 AM

## 2020-07-09 NOTE — ED Notes (Signed)
Pt continues to sleep, when this nurse entered room to wake pt up for meds, pt looks at nurse with no response. Will attempt again when pt wakes up

## 2020-07-09 NOTE — ED Notes (Signed)
Hourly rounding completed at this time, patient currently asleep in room. No complaints, stable, and in no acute distress. Q15 minute rounds and monitoring via Rover and Officer to continue. 

## 2020-07-09 NOTE — ED Notes (Signed)
Pt allowed blood collection and urine requested from pt

## 2020-07-09 NOTE — ED Notes (Signed)
Update provided to his mother   Pt resting quietly  Mother inquiring if she can bring him a Thanksgiving meal on Thursday and I informed her that no outside food is allowed   She said  "but my food is so good"   Again I informed her that no outside food can be brought into the area  She verbalized understanding   Pt continues to await inpatient placement

## 2020-07-09 NOTE — ED Notes (Signed)
Pt drank all of medicated drink

## 2020-07-09 NOTE — ED Notes (Signed)
Report received from Amy,RN including Situation, Background, Assessment, and Recommendations. Patient alert and oriented, warm and dry, and in no acute distress. Patient made aware of Q15 minute rounds and Psychologist, counselling presence for their safety. Patient instructed to come to this nurse with needs or concerns.

## 2020-07-09 NOTE — ED Notes (Signed)
Pt laying on bed, eyes closed, respirations normal.  Will continue to monitor Q15 Min

## 2020-07-09 NOTE — BH Assessment (Signed)
TTS made contact with Ernest Cook [facility]) who confirmed pt to be on [facility] wait list.

## 2020-07-09 NOTE — ED Notes (Signed)
Pt sitting on bed staring at wall.  Ernest Cook and myself went in and placed a sheet on his bed and gave him 2 warm blankets and new pillow.  Pt was agreeable and allowed [place] to place bedding on bed.  Offered drink and sandwich tray and he refused.  Will continue to monitor Q15 min

## 2020-07-09 NOTE — BH Assessment (Addendum)
Late entry- TTS and Nanine Means, NP attempted to complete reassessment but was unsuccessful due to pt's current disoriented presentation. Pt presents with psychotic and paranoid behaviors and is currently  unable to answer questions appropriately.  Per Nanine Means, NP pt continues to meet criteria for INPT  TTS was contacted by Darnelle Catalan ([employer] rep) who confirmed tracking numbers to no longer be needed for a [facility] referral. TTS will follow up with [facility] regarding referral accordingly.   TTS was contacted by Glee Arvin (ACT Team) who was provided an update on pt's current disposition. Latoya agreed to the disposition and understands pt is currently awaiting placement for INPT.

## 2020-07-09 NOTE — ED Notes (Signed)
He is lying in bed - NAD observed   Assessment completed  He verbalizes "My arm hurts sometime"   His mucus membranes appears dry, mouth cracked from dryness   Moisturizer provided for his lips along with of water   (skin moisturizer placed in paper medicine cup for his lips)  He verbalizes  "Thank you"

## 2020-07-09 NOTE — ED Provider Notes (Signed)
Emergency Medicine Observation Re-evaluation Note  Ernest Cook is a 29 y.o. male, seen on rounds today.  Pt initially presented to the ED for complaints of Mental Health Problem Currently, the patient is resting, voices no medical complaints.  Physical Exam  BP 138/88 (BP Location: Left Arm)   Pulse 86   Temp 97.9 F (36.6 C) (Oral)   Resp 18   Ht 5\' 11"  (1.803 m)   Wt 90.7 kg   SpO2 100%   BMI 27.89 kg/m  Physical Exam General: Resting in no acute distress Cardiac: No cyanosis Lungs: Equal rise and fall Psych: Not agitated  ED Course / MDM  EKG:    I have reviewed the labs performed to date as well as medications administered while in observation.  Recent changes in the last 24 hours include patient allowed nursing to draw labs after 60+ hours in the ED; labs reviewed by me, unremarkable.  Plan  Current plan is for psychiatric admission. Patient is under full IVC at this time.   , MD 07/09/20 [phone]

## 2020-07-09 NOTE — ED Notes (Signed)
Pt standing in doorway, pt shakes head when offered drink but accepted nourishment anyway

## 2020-07-09 NOTE — ED Notes (Signed)
Pt lying on bed; eyes closed; appears to be resting with even and unlabored respirations

## 2020-07-09 NOTE — ED Notes (Addendum)
Pt awake and sitting at edge of bed, bed cleaned (was wet), new sheets and pillow with blankets given, pt appears with psychomotor retardation and has forced hard laughter with any question, "do you want something to eat? Would you like the TV on? Can we collect some lab work? Would you like to change clothes?"  Further interventions refused by pt att

## 2020-07-10 LAB — RESP PANEL BY RT-PCR (FLU A&B, COVID) ARPGX2
Influenza A by PCR: NEGATIVE
Influenza B by PCR: NEGATIVE
SARS Coronavirus 2 by RT PCR: NEGATIVE

## 2020-07-10 MED ORDER — DIPHENHYDRAMINE HCL 25 MG PO CAPS
50.0000 mg | ORAL_CAPSULE | Freq: Once | ORAL | Status: AC
  Administered 2020-07-10: 50 mg via ORAL

## 2020-07-10 MED ORDER — HALOPERIDOL 5 MG PO TABS
5.0000 mg | ORAL_TABLET | Freq: Once | ORAL | Status: AC
  Administered 2020-07-10: 5 mg via ORAL

## 2020-07-10 NOTE — ED Notes (Signed)
IVC  [facility]  WAITLIST 

## 2020-07-10 NOTE — ED Notes (Signed)
Hourly rounding completed at this time, patient currently awake in room. No complaints, stable, and in no acute distress. Q15 minute rounds and monitoring via Rover and Officer to continue. °

## 2020-07-10 NOTE — ED Notes (Signed)
Hourly rounding completed at this time, patient currently asleep in room. No complaints, stable, and in no acute distress. Q15 minute rounds and monitoring via Rover and Officer to continue. 

## 2020-07-10 NOTE — ED Notes (Signed)
Pt agreeable to talk pills from this RN. This RN showed patient the packages of pills to verify medications being given. Pt placed pills in mouth and took a sip of water from cup. Tracey EDT at bedside with this RN while patient took pills. Informed primary RN Linton Rump and Asher Muir, NP.

## 2020-07-10 NOTE — ED Notes (Signed)
Pt asleep at this time, will attempt to collect vital signs once pt is awake.

## 2020-07-10 NOTE — ED Notes (Signed)
Pt only answers questions that are asked, will not engage in discussion. Pt refuses medication for arm that is in pain, when asked if arm hurts, pt states, "a little."

## 2020-07-10 NOTE — ED Notes (Signed)
Pt remains standing in doorway, calm at this time, nurse walks by pt during safety checks and asks patient if he needs anything. Pt states he wants to call his family. This nurse lets pt know that phone hours being at 9AM and will be sure to let the nurse coming in know. Pt shakes head in appreciation. Will continue to monitor.
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
## 2020-07-10 NOTE — ED Notes (Signed)
Pt remains calm, refusing medicine.

## 2020-07-10 NOTE — ED Notes (Signed)
He is resting quietly with his eyes closed  VS to be obtained when he awakens

## 2020-07-10 NOTE — ED Notes (Addendum)
Attempted to administer pt meds at this time, pt looked at nurse during speaking and pt stares at nurse and then rolls away from nurse. Will attempt to administer meds again later

## 2020-07-10 NOTE — ED Notes (Signed)
Pt standing behind door. Nurse speaks to pt and pt questions why he is still in ED, pt questions why there is a child in room across hallway and states he needs to go home and needs to call family. Pt states we cannot keep him here by giving him food. Nurse updates pt on plan of care and pt is unhappy and argumentative with plan. Pt attempts to tell nurse that he is leaving after he calls his family to pick him up and that he gets his medications from real doctors and nurses. MD and security notified of situation at hand.

## 2020-07-10 NOTE — ED Notes (Signed)
Pt given breakfast tray

## 2020-07-10 NOTE — ED Notes (Signed)
Resumed care from Tai Skelly r rn.  Pt in room, calm and cooperative.

## 2020-07-10 NOTE — ED Notes (Signed)
Pt returns to seated position at this time.

## 2020-07-10 NOTE — BH Assessment (Signed)
Confirmed with [facility] (Robinette-[phone]), patient is on their Waitlist.  ?

## 2020-07-10 NOTE — ED Notes (Signed)
Pt offered snack and drink, pt declined at this time.

## 2020-07-10 NOTE — ED Provider Notes (Signed)
Emergency Medicine Observation Re-evaluation Note  Issaiah JAHMAL DUNAVANT is a 29 y.o. male, seen on rounds today.  Pt initially presented to the ED for complaints of Mental Health Problem Currently, the patient is awake, voices no medical complaints.  Physical Exam  BP (!) 154/95 (BP Location: Left Arm)   Pulse 92   Temp 97.9 F (36.6 C) (Oral)   Resp 15   Ht 5\' 11"  (1.803 m)   Wt 90.7 kg   SpO2 97%   BMI 27.89 kg/m  Physical Exam General: Awake, taking nighttime medications which he missed due to sleeping. Cardiac: No cyanosis Lungs: Equal rise and fall Psych: Not agitated  ED Course / MDM  EKG:    I have reviewed the labs performed to date as well as medications administered while in observation.  Recent changes in the last 24 hours include no events overnight.  Plan  Current plan is for psychiatric admission. Patient is under full IVC at this time.   , MD 07/10/20 [phone]

## 2020-07-10 NOTE — ED Notes (Signed)
Report received from Amy, RN including Situation, Background, Assessment, and Recommendations. Patient alert and oriented, warm and dry, and in no acute distress. Patient denies SI, HI, AVH and pain. Patient made aware of Q15 minute rounds and Rover and Officer presence for their safety. Patient instructed to come to this nurse with needs or concerns. 

## 2020-07-10 NOTE — ED Notes (Signed)
Pt given meal tray.

## 2020-07-11 DIAGNOSIS — F250 Schizoaffective disorder, bipolar type: Secondary | ICD-10-CM | POA: Diagnosis not present

## 2020-07-11 LAB — LITHIUM LEVEL: Lithium Lvl: 0.23 mmol/L — ABNORMAL LOW (ref 0.60–1.20)

## 2020-07-11 NOTE — ED Notes (Signed)
Pt continues to not be interactive with this nurse as has been case last 2 nights. Pt did answer questions. Pt asked if pt needed medications for pain in arm, pt states he is not taking any meds from this nurse and that he has medications at home.

## 2020-07-11 NOTE — ED Notes (Signed)
Hourly rounding completed at this time, patient currently asleep in room. No complaints, stable, and in no acute distress. Q15 minute rounds and monitoring via Rover and Officer to continue. 

## 2020-07-11 NOTE — ED Notes (Signed)
Pt woken up and medications provided, pt refuses meds. Pt vocalizes that he has medications at home he can take. Pt takes medications from day shift but has continued to refuses from this nurse for last 3 nights. Pt reminded he is in ED and staff wants pt to stay up to date on taking meds while he is here, pt has no response and does not communicate with nurse again at this time. Returns to sleep.

## 2020-07-11 NOTE — ED Notes (Signed)
Patient stripped his bed and requested that his bed be wiped down, ben was wiped down, when writer returned with clean lined, patient offered to dress his own bed. Patient has been very polite and cooperative this morning. Will continue to monitor

## 2020-07-11 NOTE — ED Notes (Signed)
Pt asleep at this time, unable to collect vitals. Will collect pt vitals once awake. 

## 2020-07-11 NOTE — ED Provider Notes (Signed)
Emergency Medicine Observation Re-evaluation Note  Ernest Cook is a 29 y.o. male, seen on rounds today.  Pt initially presented to the ED for complaints of Mental Health Problem Currently, the patient is resting.  Physical Exam  BP 102/64   Pulse 75   Temp 98 F (36.7 C)   Resp 18   Ht 1.803 m (5\' 11" )   Wt 90.7 kg   SpO2 97%   BMI 27.89 kg/m  Physical Exam Gen:  No acute distress Resp:  Breathing easily and comfortably, no accessory muscle usage Neuro:  Moving all four extremities, no gross focal neuro deficits Psych:  Resting currently, quiet when awake.  ED Course / MDM  EKG:    I have reviewed the labs performed to date as well as medications administered while in observation.  Recent changes in the last 24 hours include no significant changes.  Plan  Current plan is for transfer to [facility] when bed is available. Patient is under full IVC at this time.   [facility], MD 07/11/20 [phone]

## 2020-07-11 NOTE — ED Notes (Signed)
Pt given meal tray and ginger ale 

## 2020-07-11 NOTE — ED Notes (Signed)
Pt awake at this time, ambulatory to restroom. Pt would not stop when name was called in attempt to hand pt urine specimen cup for sample.

## 2020-07-11 NOTE — ED Notes (Signed)
Lithium level drawn and sent

## 2020-07-11 NOTE — ED Notes (Signed)
Report received from Jeannette, RN including Situation, Background, Assessment, and Recommendations. Patient alert and oriented, warm and dry, and in no acute distress. Patient denies SI, HI, AVH and pain. Patient made aware of Q15 minute rounds and Rover and Officer presence for their safety. Patient instructed to come to this nurse with needs or concerns. 

## 2020-07-11 NOTE — ED Notes (Signed)
INVOLUNTARY on [facility] waitlist

## 2020-07-11 NOTE — ED Notes (Signed)
Pt to door at this time, this nurse speaks to patient and pt requests to make phone call and asks for time. Pt informed of time and phone hours. Pt expresses understanding and returns to bed, will update oncoming nurse.

## 2020-07-11 NOTE — ED Notes (Signed)
Pt back to room, this nurse informs pt that there are meds for him to take, pt states, "I don't want to talk about no medicine, I don't take it at home and I won't from you." While pt states this he is covering back up with blanket and laying in bed. Pt does not speak to nurse again, will continue to monitor.

## 2020-07-11 NOTE — ED Notes (Signed)
Pt given ginger ale but refused meal tray

## 2020-07-11 NOTE — ED Notes (Signed)
Cousin here to visit with patient

## 2020-07-11 NOTE — ED Notes (Signed)
patient refused liquid lithium, states he wants the 3 pills.

## 2020-07-11 NOTE — ED Notes (Signed)
Pt given ginger ale.

## 2020-07-12 MED ORDER — DROPERIDOL 2.5 MG/ML IJ SOLN
5.0000 mg | Freq: Once | INTRAMUSCULAR | Status: AC
  Administered 2020-07-12: 5 mg via INTRAMUSCULAR

## 2020-07-12 NOTE — ED Notes (Signed)
Pt refuses food and drink from this nurse

## 2020-07-12 NOTE — ED Notes (Signed)
Hourly rounding completed at this time, patient currently awake in room. Pt sitting on side of bed and staring at nurse as he walks bad, nurse asks if pt needs anything and pt does not respond, pt then goes to restroom. No complaints, stable, and in no acute distress. Q15 minute rounds and monitoring via Psychologist, counselling to continue.

## 2020-07-12 NOTE — ED Notes (Signed)
Patient is IVC pending placement 

## 2020-07-12 NOTE — ED Notes (Signed)
Pt calm, awake and sitting on side of the bed at this time.

## 2020-07-12 NOTE — ED Notes (Signed)
Patient sleeping since arrival from quad area.

## 2020-07-12 NOTE — ED Notes (Addendum)
Pt standing in doorway 

## 2020-07-12 NOTE — ED Notes (Signed)
Hourly rounding completed at this time, patient currently asleep in room. No complaints, stable, and in no acute distress. Q15 minute rounds and monitoring via Rover and Officer to continue. 

## 2020-07-12 NOTE — ED Notes (Signed)
Unable to obtain vital signs, patient sleeping

## 2020-07-12 NOTE — ED Notes (Signed)
Pt will be transferred to [facility].

## 2020-07-12 NOTE — ED Notes (Signed)
Hourly rounding reveals patient in room. No complaints, stable, in no acute distress. Q15 minute rounds and monitoring via Security Cameras to continue. 

## 2020-07-12 NOTE — ED Notes (Signed)
Pt remains awake but does not allow nurse to check vitals, will continue to monitor.

## 2020-07-12 NOTE — ED Notes (Signed)
Pt asleep at this time, unable to collect vitals. Will collect pt vitals once awake. 

## 2020-07-12 NOTE — ED Notes (Signed)
Report to include situation, background, assessment and recommendations from Jeannette RN. Patient sleeping, respirations regular and unlabored. Q15 minute rounds and security camera observation to continue.    

## 2020-07-12 NOTE — BH Assessment (Signed)
Confirmed with [facility] ([phone]), patient is on their Waitlist.  

## 2020-07-12 NOTE — ED Notes (Signed)
Pt to door standing in doorway, nurse performing safety rounds and checks on pt. Asked pt if he needed anything, pt states, "I just want to go home, let me call my cousin to come pick me up.The police won't come get me and take me home but I know my cousin will. When can I use the phone?" Pt informed that phone hours are at 9AM, pt turns around, says thanks, and returns to sit on side of bed

## 2020-07-12 NOTE — ED Notes (Signed)
IVC  ON  [facility]  WAITLIST 

## 2020-07-12 NOTE — ED Notes (Signed)
Pt awake at this time, pt to restroom, unable to collect urine sample due to being in another room at time. After nurse exits that room, nurse visits pt and he is sitting on side of bed reading copy of IVC paperwork that was provided during the day. Pt states to nurse, "this says I only have to stay 24 hours, I have been here longer than that. I want to go home." This nurse informs pt of plan of care and informs pt that there are medications orders for pt, pt states, "stop playing games with me, I have medicine at home, I want to go home, I don't need your medicine." Nurse exits room, pt sitting on side of bed, will continue to monitor.

## 2020-07-12 NOTE — ED Notes (Addendum)
Pt is agitated and has a copy of his IVC paperwork. Pt states he is tried of playing games and wants to go home.  Pt wants his cousin to come get him out of here.

## 2020-07-12 NOTE — BH Assessment (Signed)
TTS was contacted [facility]) to confirm pt's wait list status.

## 2020-07-12 NOTE — ED Notes (Signed)
Pt awake but agitated at this time. Staff unable to obtain VS.

## 2020-07-12 NOTE — ED Notes (Addendum)
Pt walking out into the hall way and refusing to stop, pt rushes by the officer and breaks through EMS bay door. Pt made it through 1st EMS door and was stopped by the 2nd EMS bay door. Pt able to be redirected by officer.

## 2020-07-12 NOTE — ED Notes (Addendum)
Pt ran to ambulance bay, made it out the 1st door and was stopped by security.  EDP made aware. IM medication given as ordered.   Pt yelling, demanding to go home. EDP and charge nurse speaking with patient.

## 2020-07-12 NOTE — ED Notes (Signed)
Pt continue to sit on side of bed, stares at staff when walking by room performing safety checks.

## 2020-07-13 NOTE — ED Notes (Signed)
Shower supplies given to pt. Pt is currently taking a shower.  

## 2020-07-13 NOTE — ED Notes (Signed)
Hourly rounding reveals patient in room. No complaints, stable, in no acute distress. Q15 minute rounds and monitoring via Security Cameras to continue. 

## 2020-07-13 NOTE — ED Notes (Signed)
Lunch tray given. 

## 2020-07-13 NOTE — ED Notes (Signed)
Patient woke upset because he missed thanksgiving. He asked to call his cousin so he can pick him up . Patient is so manic and started banging the doors and windows. Officer and staff talked to him and he calmed down. Charge nurse aware.

## 2020-07-13 NOTE — ED Notes (Signed)
Report to include situation, background, assessment and recommendations from Jeannette RN. Patient sleeping, respirations regular and unlabored. Q15 minute rounds and security camera observation to continue.    

## 2020-07-13 NOTE — ED Notes (Signed)
Dinner tray given

## 2020-07-13 NOTE — ED Provider Notes (Signed)
Emergency Medicine Observation Re-evaluation Note  Ernest Cook is a 29 y.o. male, seen on rounds today.  Pt initially presented to the ED for complaints of Mental Health Problem Currently, the patient is resting in bed.  Required IM sedation earlier this morning.  Physical Exam  BP 125/80 (BP Location: Left Arm)   Pulse 93   Temp 98.2 F (36.8 C) (Oral)   Resp 16   Ht 5\' 11"  (1.803 m)   Wt 90.7 kg   SpO2 99%   BMI 27.89 kg/m  Physical Exam General: Currently resting in bed, did not disturb the patient. Psych: Currently resting in bed but required IM sedation earlier today.  ED Course / MDM  Recent lab work includes negative Covid, lithium level 0.23.  No other significant findings on lab work.  Plan  Current plan: [facility] has [facility] he is currently on the wait list.  Required IM sedation earlier this morning by psychiatry.  Patient currently resting comfortably. Patient is under full IVC at this time.   , MD 07/13/20 [id number]

## 2020-07-13 NOTE — ED Notes (Signed)
Patient allowed to use phone this morning called is cousin Dephene. Daphene appears to be very supportive and is on board with any plan the doctor decides is best for the patient.

## 2020-07-13 NOTE — ED Notes (Signed)
Mrs. Michael Boston [phone] This is his cousin whom would like to be called with any updates and if he is released. Cousin does not feel he is ready to be discharged, but will pick him up if he is being dispositioned to home.Marland Kitchen

## 2020-07-13 NOTE — ED Notes (Signed)
Patient requesting phone, phone provided.

## 2020-07-13 NOTE — ED Notes (Signed)
Psych NP at bedside to answer any of patient concerns.

## 2020-07-13 NOTE — ED Notes (Signed)
Patient provided with oral hygiene and shower supplies. Patient showered this morning and changed his clothes.

## 2020-07-13 NOTE — ED Notes (Signed)
Pt is awake. Sitting on the side of the bed. Pt allowed this tech to obtain vs. Pt is currently using the phone. No other needs found at this moment.

## 2020-07-13 NOTE — ED Notes (Signed)
Unable to get vs at this moment pt is asleep. Will obtain vs after breakfast time. Jeannette, RN aware. °

## 2020-07-13 NOTE — ED Notes (Signed)
Breakfast tray given. °

## 2020-07-14 NOTE — ED Notes (Signed)
Pt is wanting staff to contact his family so he can be released tonight.  RN explained only the doctor can release him - not his family.  Pt still adamant he is to leave tonight. Pt is irritated.

## 2020-07-14 NOTE — ED Notes (Signed)
Hourly rounding reveals patient in room. No complaints, stable, in no acute distress. Q15 minute rounds and monitoring via Security Cameras to continue. 

## 2020-07-14 NOTE — ED Notes (Signed)
Pt refusing vitals at this time, will try again later today.

## 2020-07-14 NOTE — ED Notes (Signed)
Pt currently sleeping, no acute distress noted at this time. Will continue to monitor via security cameras and Q15 min rounding. 

## 2020-07-14 NOTE — ED Notes (Signed)
Pt given breakfast tray

## 2020-07-14 NOTE — ED Notes (Signed)
Dinner tray and drink provided.  ?

## 2020-07-14 NOTE — ED Notes (Signed)
Report to include Situation, Background, Assessment, and Recommendations received from Amy RN. Patient alert and oriented, warm and dry, in no acute distress. Patient denies SI, HI, AVH and pain. Patient made aware of Q15 minute rounds and security cameras for their safety. Patient instructed to come to me with needs or concerns.  

## 2020-07-14 NOTE — BH Assessment (Signed)
[facility]([phone]), patientremain on their wait list.

## 2020-07-14 NOTE — ED Notes (Signed)
Patient woke up and asked to make a phone call. Staff member told him he can use the phone after 9 am. Patient said ok and went to his room. He waited for 30 minutes and started banging the wall, widows and doors. Then requested for the phone again. To deescalate the situation this writer allowed him to use the phone. The patient is also wearing his shoe with shoe lace intact.

## 2020-07-14 NOTE — BH Assessment (Signed)
Confirmed with [facility] (Stephanie-[phone]), patient is on their Waitlist.  

## 2020-07-14 NOTE — ED Provider Notes (Signed)
Emergency Medicine Observation Re-evaluation Note  Ernest Cook is a 29 y.o. male, seen on rounds today.  Pt initially presented to the ED for complaints of Mental Health Problem Currently, the patient is requesting to be discharged tonight had been sleeping earlier  Physical Exam  BP 124/84 (BP Location: Left Arm)   Pulse 84   Temp 98.2 F (36.8 C) (Oral)   Resp 16   Ht 1.803 m (5\' 11" )   Wt 90.7 kg   SpO2 99%   BMI 27.89 kg/m  Physical Exam General: Irritable, sitting in bed  Psych: Becoming more irritable because of inability to go home tonight  ED Course / MDM    Plan  Current plan is for placement of [facility] Patient is under full IVC at this time.   , MD 07/14/20 [phone]

## 2020-07-14 NOTE — ED Notes (Signed)
VS were not taken during the night.  Pt is agitated currently. VS will be taken when pt is more cooperative.

## 2020-07-15 NOTE — ED Notes (Signed)
Patient allowed visitor from home. Cousin at bedside

## 2020-07-15 NOTE — ED Notes (Signed)
Patient had a good visit with his cousin, patient smiling and in god mood after cousin left

## 2020-07-15 NOTE — ED Notes (Signed)
Hourly rounding reveals patient in room. No complaints, stable, in no acute distress. Q15 minute rounds and monitoring via Security Cameras to continue. 

## 2020-07-15 NOTE — ED Provider Notes (Signed)
Emergency Medicine Observation Re-evaluation Note  Ernest Cook is a 29 y.o. male, seen on rounds today.  Pt initially presented to the ED for complaints of Mental Health Problem Currently, the patient is actively hallucinating, noted to be speaking to others who are not there. He is pacing from room to room but remains cooperative.  Physical Exam  BP 124/84 (BP Location: Left Arm)   Pulse 84   Temp 98.2 F (36.8 C) (Oral)   Resp 16   Ht 5\' 11"  (1.803 m)   Wt 90.7 kg   SpO2 99%   BMI 27.89 kg/m  Physical Exam Constitutional: Walking from room to room, actively hallucinating. Eyes: Conjunctivae are normal. Head: Atraumatic. Nose: No congestion/rhinnorhea. Mouth/Throat: Mucous membranes are moist. Neck: Normal ROM Cardiovascular: No cyanosis noted. Respiratory: Normal respiratory effort. Gastrointestinal: Non-distended. Genitourinary: deferred Musculoskeletal: No lower extremity tenderness nor edema. Neurologic:  Normal speech and language. No gross focal neurologic deficits are appreciated. Skin:  Skin is warm, dry and intact. No rash noted.    ED Course / MDM  EKG:    I have reviewed the labs performed to date as well as medications administered while in observation.  Recent changes in the last 24 hours include patient gradually becoming more agitated but currently cooperative and redirectable.  Plan  Current plan is for placement at [facility]. Patient is under full IVC at this time.   [facility], MD 07/15/20 [phone]

## 2020-07-15 NOTE — ED Notes (Signed)
Patient continues to pace from room to room singing and dancing. Requested phone during phone hours, returned phone when completed. Continues to be cooperative

## 2020-07-15 NOTE — ED Notes (Signed)
Patient is IVC  on wait list for [facility]

## 2020-07-15 NOTE — ED Notes (Signed)
Patient banging on window requesting phone. Lunch provided.

## 2020-07-15 NOTE — ED Notes (Signed)
Obtained VS on pt.  

## 2020-07-15 NOTE — Consult Note (Signed)
Patient seen and evaluated in person.  He is calm and cooperative, sitting on his bed watching television.  No suicidal/homicidal ideations, hallucinations, or substance use.  His behavior appears to be returning to baseline and this is compatible with his cousin who knows him well voicing this to the RN today after her visit with him.  He is insistent he can leave and stay with his mother, then return to his home and live independently.  Let him know this would need to be coordinated with his team, including his ACT team, [employer].  He continues to want to coordinate this himself and discharge today to enjoy Thanksgiving.  Therapeutic use of empathy conveyed to the client and encouragement to continue his progress.  Nanine Means, PMHNP

## 2020-07-15 NOTE — ED Notes (Signed)
Pt getting a little agitated, wanting to use phone to call "family" to come and pick him up. Pt advised on phone hours, then stated he would return to his room to pray for himself and calm down. RN Para March made aware.

## 2020-07-15 NOTE — ED Notes (Signed)
Patient pacing in room since this morning, taking full blown conversations to himself but cooperative with meals and meds. Will continue to monitor.

## 2020-07-15 NOTE — ED Notes (Signed)

## 2020-07-15 NOTE — ED Notes (Signed)
Patient up early knocking on window requesting phone, patient re-oriented to time and phone policy. Patient back to his bed, will continue to monitor.

## 2020-07-15 NOTE — ED Notes (Signed)
VS not taken, Patient asleep. 

## 2020-07-15 NOTE — ED Notes (Signed)
Pt given meal tray.

## 2020-07-16 DIAGNOSIS — F250 Schizoaffective disorder, bipolar type: Secondary | ICD-10-CM | POA: Diagnosis not present

## 2020-07-16 NOTE — BH Assessment (Signed)
TTS was contacted by Glee Arvin [facility] ACT[facility]) asking for an update and expressing concern with pt's current verbal communication by phone. TTS provided Latoya an update regarding pt's current [facility] status and recent visit completed with pt's cousin yesterday who reported pt to be at his baseline. Latoya reports concern with pt's cousin's involvement and expressed pt to need a higher level of care in a structured facility [facility]). Per Glee Arvin "The pt's family is very dysfunctional and plans created with family supports usually result to pt returning to the ED". Latoya denies any efforts to locate placement for pt as at this time but expressed willingness to discuss pt's options with the team as he is currently homeless. TTS agrees to follow up with Latoya after pt is reassessed this morning and Latoya agrees to keep TTS updated regarding pt's care coordination.   TTS updated psych

## 2020-07-16 NOTE — ED Notes (Signed)
Pt asleep for snack time.  Will hold snack until he wakes up

## 2020-07-16 NOTE — ED Notes (Signed)
Pt was offered a shower and declined. Pt stated "I will take a shower when I get home because it is too cold here."

## 2020-07-16 NOTE — ED Notes (Signed)
Patient up early knocking on window requesting phone. Patient advised that it is not phone hours, patient asked what time was it, time provided patient said ok and went back into his room. Safety maintained will continue to monitor

## 2020-07-16 NOTE — ED Notes (Signed)
Pt given breakfast tray and the phone.

## 2020-07-16 NOTE — Consult Note (Signed)
[facility] Face-to-Face Psychiatry Consult   Reason for Consult:   Follow-up for this gentleman with schizoaffective disorder.  For short periods of time he can behave well and appear to be lucid but he continues to have daily outbursts of anger threatening behavior and hostility with poor insight poor lucidity. Referring Physician: Katrinka Blazing Patient Identification: Ernest Cook MRN:  [id number] Principal Diagnosis: Schizoaffective disorder, bipolar type (HCC) Diagnosis:  Principal Problem:   Schizoaffective disorder, bipolar type (HCC) Active Problems:   Noncompliance   Broken arm, right, closed, initial encounter   Total Time spent with patient: 30 minutes  Subjective:   Ernest Cook is a 29 y.o. male patient admitted with "I need to get out of here".  HPI: Patient continues as noted above to have daily outbursts of hostility and inappropriate anger.  Threatening statements and behavior.  Agitation.  Poor self-care poor insight.  Past Psychiatric History: Past history of chronic mental health problems with intermittent decompensations  Risk to Self: Suicidal Ideation: No (Unable to assess) Suicidal Intent: No (Unable to assess) Is patient at risk for suicide?: No Suicidal Plan?: No Access to Means: No What has been your use of drugs/alcohol within the last 12 months?: Unable to assess How many times?: 0 Other Self Harm Risks: Unable to assess Triggers for Past Attempts: Unknown Intentional Self Injurious Behavior: None Risk to Others: Homicidal Ideation: Yes-Currently Present Thoughts of Harm to Others: Yes-Currently Present Comment - Thoughts of Harm to Others: Yes, per ACT Team Current Homicidal Intent: Yes-Currently Present Current Homicidal Plan: Yes-Currently Present Describe Current Homicidal Plan: Patient had knife with plans to stab someone. Access to Homicidal Means: Yes Describe Access to Homicidal Means: Access to weapons Identified Victim: No specific person History of  harm to others?: Yes Assessment of Violence: None Noted Violent Behavior Description: Per ACT Team, confrontational and verbally abusive to staff  Does patient have access to weapons?: Yes (Comment) Criminal Charges Pending?: No Does patient have a court date: No Prior Inpatient Therapy: Prior Inpatient Therapy: Yes Prior Therapy Dates: 03/2020 & 03/2016 Prior Therapy Facilty/Provider(s): [facility] Reason for Treatment: Schizophrenia Prior Outpatient Therapy: Prior Outpatient Therapy: Yes Prior Therapy Dates: Current Prior Therapy Facilty/Provider(s): [facility] Reason for Treatment: Schizophrenia Does patient have an ACCT team?: Yes Does patient have Intensive In-House Services?  : No Does patient have [facility] services? : No Does patient have P4CC services?: No  Past Medical History:  Past Medical History:  Diagnosis Date  . Schizo affective schizophrenia [facility])     Past Surgical History:  Procedure Laterality Date  . ORIF HUMERUS FRACTURE Right 06/27/2020   Procedure: OPEN REDUCTION INTERNAL FIXATION (ORIF) HUMERAL SHAFT FRACTURE;  Surgeon: Roby Lofts, MD;  Location: [facility] OR;  Service: Orthopedics;  Laterality: Right;  . SKIN GRAFT Left unknown   Pt reports left foot and leg skin graft   Family History:  Family History  Problem Relation Age of Onset  . Diabetes Mother   . Hypertension Mother    Family Psychiatric  History: See previous Social History:  Social History   Substance and Sexual Activity  Alcohol Use No     Social History   Substance and Sexual Activity  Drug Use Yes  . Types: Marijuana   Comment: "every now and again"    Social History   Socioeconomic History  . Marital status: Single    Spouse name: Not on file  . Number of children: Not on file  . Years of education:  Not on file  . Highest education level: Not on file  Occupational History  . Not on file  Tobacco Use  . Smoking status: Current Every Day Smoker    Packs/day:  0.25    Types: Cigarettes  . Smokeless tobacco: Never Used  Substance and Sexual Activity  . Alcohol use: No  . Drug use: Yes    Types: Marijuana    Comment: "every now and again"  . Sexual activity: Yes    Birth control/protection: Condom    Comment: Pt reports he is attracted to men  Other Topics Concern  . Not on file  Social History Narrative  . Not on file   Social Determinants of Health   Financial Resource Strain:   . Difficulty of Paying Living Expenses: Not on file  Food Insecurity:   . Worried About Programme researcher, broadcasting/film/videounning Out of Food in the Last Year: Not on file  . Ran Out of Food in the Last Year: Not on file  Transportation Needs:   . Lack of Transportation (Medical): Not on file  . Lack of Transportation (Non-Medical): Not on file  Physical Activity:   . Days of Exercise per Week: Not on file  . Minutes of Exercise per Session: Not on file  Stress:   . Feeling of Stress : Not on file  Social Connections:   . Frequency of Communication with Friends and Family: Not on file  . Frequency of Social Gatherings with Friends and Family: Not on file  . Attends Religious Services: Not on file  . Active Member of Clubs or Organizations: Not on file  . Attends BankerClub or Organization Meetings: Not on file  . Marital Status: Not on file   Additional Social History:    Allergies:   Allergies  Allergen Reactions  . Penicillins Other (See Comments)    Seizure when he was 19    Labs: No results found for this or any previous visit (from the past 48 hour(s)).  Current Facility-Administered Medications  Medication Dose Route Frequency Provider Last Rate Last Admin  . aspirin EC tablet 81 mg  81 mg Oral Daily Willy Eddyobinson, Patrick, MD   81 mg at 07/16/20 1009  . atomoxetine (STRATTERA) capsule 40 mg  40 mg Oral q morning - 10a Willy Eddyobinson, Patrick, MD   40 mg at 07/16/20 1009  . benztropine (COGENTIN) tablet 1 mg  1 mg Oral QHS Willy Eddyobinson, Patrick, MD   1 mg at 07/15/20 2319  . cholecalciferol  (VITAMIN D3) tablet 2,000 Units  2,000 Units Oral Daily Charm RingsLord, Jamison Y, NP   2,000 Units at 07/16/20 1009  . diphenhydrAMINE (BENADRYL) injection 50 mg  50 mg Intramuscular Q6H PRN Sindhu Nguyen T, MD   50 mg at 07/13/20 0524  . haloperidol lactate (HALDOL) injection 5 mg  5 mg Intramuscular Q6H PRN Chimaobi Casebolt, Jackquline DenmarkJohn T, MD   5 mg at 07/13/20 0525  . HYDROcodone-acetaminophen (NORCO/VICODIN) 5-325 MG per tablet 1 tablet  1 tablet Oral Q8H PRN Willy Eddyobinson, Patrick, MD      . lithium citrate 300 MG/5ML solution 300 mg  300 mg Oral TID Willy Eddyobinson, Patrick, MD   300 mg at 07/16/20 1009  . LORazepam (ATIVAN) tablet 2 mg  2 mg Oral Q6H PRN Kerstin Crusoe, Jackquline DenmarkJohn T, MD   2 mg at 07/12/20 [id number]   Or  . LORazepam (ATIVAN) injection 2 mg  2 mg Intramuscular Q6H PRN Lj Miyamoto, Jackquline DenmarkJohn T, MD   2 mg at 07/13/20 0525  . LORazepam (ATIVAN) tablet 2  mg  2 mg Oral Once Imara Standiford T, MD      . methocarbamol (ROBAXIN) tablet 500 mg  500 mg Oral Q6H PRN Willy Eddy, MD      . risperiDONE (RISPERDAL) 1 MG/ML oral solution 3 mg  3 mg Oral QHS Willy Eddy, MD   3 mg at 07/15/20 2319   Current Outpatient Medications  Medication Sig Dispense Refill  . aspirin EC 81 MG EC tablet Take 1 tablet (81 mg total) by mouth daily. Swallow whole. 30 tablet 0  . atomoxetine (STRATTERA) 40 MG capsule Take 40 mg by mouth every morning.     . benztropine (COGENTIN) 1 MG tablet Take 1 mg by mouth at bedtime.     . cholecalciferol (VITAMIN D) 25 MCG tablet Take 2 tablets (2,000 Units total) by mouth daily. 60 tablet 2  . HYDROcodone-acetaminophen (NORCO/VICODIN) 5-325 MG tablet Take 1 tablet by mouth every 8 (eight) hours as needed for severe pain. 10 tablet 0  . lithium carbonate 150 MG capsule Take 3 capsules (450 mg total) by mouth 2 (two) times daily with a meal. 80 capsule 0  . lithium carbonate 300 MG capsule Take 1 capsule (300 mg total) by mouth at bedtime. 14 capsule 0  . methocarbamol (ROBAXIN) 500 MG tablet Take 1 tablet (500 mg  total) by mouth every 6 (six) hours as needed for muscle spasms. 20 tablet 0  . risperiDONE (RISPERDAL) 3 MG tablet Take 1 tablet (3 mg total) by mouth at bedtime. 30 tablet 1    Musculoskeletal: Strength & Muscle Tone: within normal limits Gait & Station: normal Patient leans: N/A  Psychiatric Specialty Exam: Physical Exam Vitals and nursing note reviewed.  Constitutional:      Appearance: He is well-developed.  HENT:     Head: Normocephalic and atraumatic.  Eyes:     Conjunctiva/sclera: Conjunctivae normal.     Pupils: Pupils are equal, round, and reactive to light.  Cardiovascular:     Heart sounds: Normal heart sounds.  Pulmonary:     Effort: Pulmonary effort is normal.  Abdominal:     Palpations: Abdomen is soft.  Musculoskeletal:        General: Normal range of motion.     Cervical back: Normal range of motion.  Skin:    General: Skin is warm and dry.  Neurological:     General: No focal deficit present.     Mental Status: He is alert.  Psychiatric:        Attention and Perception: He is inattentive.        Mood and Affect: Affect is labile, angry and inappropriate.        Speech: Speech is tangential.        Behavior: Behavior is agitated and aggressive.        Thought Content: Thought content is paranoid.        Cognition and Memory: Cognition is impaired. Memory is impaired.        Judgment: Judgment is impulsive and inappropriate.     Review of Systems  Constitutional: Negative.   HENT: Negative.   Eyes: Negative.   Respiratory: Negative.   Cardiovascular: Negative.   Gastrointestinal: Negative.   Musculoskeletal: Negative.   Skin: Negative.   Neurological: Negative.   Psychiatric/Behavioral: Positive for agitation and behavioral problems. The patient is hyperactive.     Blood pressure 99/60, pulse 84, temperature 97.8 F (36.6 C), temperature source Oral, resp. rate 18, height 5\' 11"  (1.803 m), weight 90.7  kg, SpO2 100 %.Body mass index is 27.89  kg/m.  General Appearance: Casual  Eye Contact:  Minimal  Speech:  Pressured  Volume:  Increased  Mood:  Angry  Affect:  Inappropriate  Thought Process:  Disorganized  Orientation:  Negative  Thought Content:  Illogical  Suicidal Thoughts:  No  Homicidal Thoughts:  Yes.  without intent/plan  Memory:  Immediate;   Fair Recent;   Poor Remote;   Poor  Judgement:  Impaired  Insight:  Shallow  Psychomotor Activity:  Increased  Concentration:  Concentration: Poor  Recall:  Poor  Fund of Knowledge:  Fair  Language:  Fair  Akathisia:  No  Handed:  Right  AIMS (if indicated):     Assets:  Desire for Improvement  ADL's:  Impaired  Cognition:  Impaired,  Mild  Sleep:        Treatment Plan Summary: Plan Patient continues to be agitated despite for medication dosages.  Continues to have outbursts that would make him very difficult to manage on our psychiatric ward.  Continues to be most appropriate for [facility] to state [facility] which is still pending and we are checking on regularly.  Arm seems to be healing well  Disposition: Recommend psychiatric Inpatient admission when medically cleared.  Mordecai Rasmussen, MD 07/16/2020 4:23 PM

## 2020-07-16 NOTE — ED Provider Notes (Signed)
Emergency Medicine Observation Re-evaluation Note  Bluford WILDER KUROWSKI is a 29 y.o. male, seen on rounds today.  Pt initially presented to the ED for complaints of Mental Health Problem Currently, the patient is resting calmly.  Physical Exam  BP 112/60 (BP Location: Left Arm)   Pulse 92   Temp 98.1 F (36.7 C) (Oral)   Resp 18   Ht 5\' 11"  (1.803 m)   Wt 90.7 kg   SpO2 99%   BMI 27.89 kg/m  Physical Exam Vitals and nursing note reviewed.  HENT:     Head: Normocephalic and atraumatic.     Right Ear: External ear normal.     Left Ear: External ear normal.     Nose: Nose normal.     Mouth/Throat:     Mouth: Mucous membranes are moist.  Eyes:     Extraocular Movements: Extraocular movements intact.     Conjunctiva/sclera: Conjunctivae normal.     Pupils: Pupils are equal, round, and reactive to light.  Cardiovascular:     Pulses: Normal pulses.  Pulmonary:     Effort: Pulmonary effort is normal.  Abdominal:     General: There is no distension.  Skin:    Capillary Refill: Capillary refill takes less than 2 seconds.  Neurological:     Mental Status: He is alert and oriented to person, place, and time.  Psychiatric:        Mood and Affect: Mood normal.      ED Course / MDM  EKG:    I have reviewed the labs performed to date as well as medications administered while in observation.  Recent changes in the last 24 hours include none.  Plan  Current plan is for OSH placement. Patient is under full IVC at this time.   , MD 07/16/20 [phone]

## 2020-07-16 NOTE — ED Notes (Addendum)
Pt asked to use the phone again. Staff has been very lenient with phone rules and patient. Pt was on the phone for about 30 min this time and when asked to finish up conversation pt loudly said, "I have my cousin on the phone and yall need to let me out of here cause I dont need to be here, my cousin came to the second floor to get me and its wrong yall wont let me out. I want to speak to the doctor. HE needs to let me out. I will get off the phone when I speak to the doctor." Staff told pt that he had one minute to finish his conversation.  Pt did end his conversation and turn phone over, but he is very loud and angry about still being here.  Pt continues to pace in locked unit.  lw edt

## 2020-07-16 NOTE — ED Notes (Signed)
Patient asked to return the phone, has been on phone over 1 hour, patient stated you gonna have t beat me up to get this phone, security made aware.

## 2020-07-16 NOTE — BH Assessment (Signed)
Confirmed with [facility]([phone]), patientis on their Waitlist.

## 2020-07-16 NOTE — BH Assessment (Signed)
TTS was contacted by Tomasa Hose [facility] repeat admission coordinator) requesting an update on pt's current presentation and need for referral. TTS provided the information, explored pt becoming priority status and confirmed pt to remain on the wait list at this time.

## 2020-07-16 NOTE — ED Notes (Signed)
patieng knocking on window requesting something to eat and drink.

## 2020-07-16 NOTE — ED Notes (Signed)
Pt pacing from room to room and throughout the hallway in the back unit. Pt steadily talking to self and very manic acting  in posture.

## 2020-07-16 NOTE — BH Assessment (Signed)
TTS was contacted by Dillon Bjork ([facility] SW [phone]) requesting to receive an update per request from pt's ACT Team. TTS provided Lupita Leash with pt's current presentation, disposition and [facility] wait list status. Lupita Leash expressed concern for pt's rapid decline from his baseline since July and the need for pt to receive additional support outside of his current ACT Team for stabilization.   Per Dr. Toni Amend pt continues to meet criteria for INPT

## 2020-07-17 NOTE — ED Notes (Signed)
No v-signs patient sleeping °

## 2020-07-17 NOTE — ED Provider Notes (Signed)
Emergency Medicine Observation Re-evaluation Note  Ernest Cook is a 29 y.o. male, seen on rounds today.  Pt initially presented to the ED for complaints of Mental Health Problem Currently, the patient is resting comfortably.  Physical Exam  BP 105/66   Pulse 81   Temp 97.6 F (36.4 C) (Oral)   Resp 18   Ht 5\' 11"  (1.803 m)   Wt 90.7 kg   SpO2 100%   BMI 27.89 kg/m  Physical Exam General: No acute distress Cardiac: Well-perfused extremities Lungs: No respiratory distress Psych: Appropriate mood and affect  ED Course / MDM  EKG:    I have reviewed the labs performed to date as well as medications administered while in observation.  Recent changes in the last 24 hours include none.  Plan  Current plan is for placement. Patient is under full IVC at this time.   , MD 07/17/20 1540

## 2020-07-17 NOTE — ED Notes (Signed)
Unable to obtain vitals due to patient sleeping at this time. Will continue to monitor.   

## 2020-07-17 NOTE — ED Notes (Signed)
Patient up early sitting on side of bed, Breakfast and scheduled meds provided. Shower offered patient decined shower " stated ill take a shower when I get home".

## 2020-07-17 NOTE — ED Notes (Signed)
No snack given to pt or vitals obtained d/t pt being asleep at this time.

## 2020-07-17 NOTE — ED Notes (Signed)
Cousin Daphene called to check on patient status. (Daphene [phone])

## 2020-07-17 NOTE — ED Notes (Signed)

## 2020-07-18 NOTE — ED Notes (Signed)
Pt pleasant, smiling, and having normal conversation at this time. Pt denies arm pain, states it keeps "popping". Given breakfast tray. Denies further needs.

## 2020-07-18 NOTE — ED Notes (Signed)
Snack was given to patient at this time 

## 2020-07-18 NOTE — ED Notes (Signed)
Unable to obtain vitals due to patient sleeping. Will continue to monitor.   

## 2020-07-18 NOTE — BH Assessment (Signed)
[facility]([phone]), patientremain on their wait list.

## 2020-07-18 NOTE — ED Notes (Signed)
Pt remains asleep. Will obtain vitals once awake.  

## 2020-07-18 NOTE — ED Notes (Signed)

## 2020-07-18 NOTE — ED Provider Notes (Signed)
Emergency Medicine Observation Re-evaluation Note  Ernest Cook is a 29 y.o. male, seen on rounds today.  Pt initially presented to the ED for complaints of Mental Health Problem No acute events overnight  Physical Exam  BP 125/75 (BP Location: Left Arm)   Pulse 88   Temp 97.8 F (36.6 C) (Oral)   Resp 16   Ht 1.803 m (5\' 11" )   Wt 90.7 kg   SpO2 100%   BMI 27.89 kg/m  Physical Exam General: No aggressive behavior  Psych: Calm and cooperative, appropriate mood  ED Course / MDM    Plan  Current plan is for [facility] Patient is under full IVC at this time.   [facility], MD 07/18/20 [phone]

## 2020-07-18 NOTE — BH Assessment (Signed)
Confirmed with [facility] ([phone]), patient is on their Waitlist.  

## 2020-07-18 NOTE — ED Notes (Signed)
INVOLUNTARY/still on wait list at [facility]

## 2020-07-19 NOTE — ED Notes (Signed)
Hourly rounding completed at this time, patient currently asleep in room. No complaints, stable, and in no acute distress. Q15 minute rounds and monitoring via Security Cameras to continue. 

## 2020-07-19 NOTE — ED Notes (Signed)
VS will be taken when patient is finished with shower.

## 2020-07-19 NOTE — ED Notes (Signed)
Report received from Amy, RN including  Situation, Background, Assessment, and Recommendations. Patient alert and oriented, warm and dry, in no acute distress. Patient denies SI, HI, AVH and pain. Patient made aware of Q15 minute rounds and security cameras for their safety. Patient instructed to come to this nurse with needs or concerns. 

## 2020-07-19 NOTE — ED Notes (Signed)
Hourly rounding completed at this time, patient currently awake in room. No complaints, stable, and in no acute distress. Q15 minute rounds and monitoring via Security Cameras to continue. 

## 2020-07-19 NOTE — BH Assessment (Signed)
[facility](E.[phone]), patientremainon theirwait list.

## 2020-07-19 NOTE — ED Notes (Signed)
Pt sleeping, VS will be taken when pt wakes. 

## 2020-07-19 NOTE — ED Notes (Signed)
Pt given a sandwich tray and a cup of sprite.  

## 2020-07-19 NOTE — ED Notes (Signed)
Pt was compliant with vitals being taken and given meal tray. Pt also requested and was given graham crackers, peanut butter and milk.

## 2020-07-19 NOTE — ED Provider Notes (Signed)
Emergency Medicine Observation Re-evaluation Note  Ernest Cook is a 29 y.o. male, seen on rounds today.    Physical Exam  BP 113/74 (BP Location: Right Arm)   Pulse 82   Temp 97.6 F (36.4 C) (Oral)   Resp 18   Ht 5\' 11"  (1.803 m)   Wt 90.7 kg   SpO2 100%   BMI 27.89 kg/m  Physical Exam General: Patient resting comfortably in bed Lungs: Patient in no respiratory distress Psych: Patient not agitated  ED Course / MDM  EKG:      Plan  Patient is currently under involuntary commitment.  On the wait list at [facility]   [facility], MD 07/19/20 [phone]

## 2020-07-19 NOTE — ED Notes (Addendum)
Pt stated he wanted to rest.  Will attempt VS later.

## 2020-07-19 NOTE — ED Notes (Signed)
Unable to obtain vitals due to patient sleeping. Will continue to monitor.   

## 2020-07-19 NOTE — ED Notes (Signed)
Pt given meal tray.

## 2020-07-19 NOTE — ED Notes (Signed)
Pt given lunch tray.

## 2020-07-19 NOTE — BH Assessment (Signed)
Confirmed with [facility] ([phone]), patient is on their Waitlist. 

## 2020-07-19 NOTE — ED Notes (Signed)
Pt given shower supplies and new scrubs, Pt is currently showering.

## 2020-07-20 NOTE — ED Notes (Signed)
Hourly rounding completed at this time, patient currently asleep in room. No complaints, stable, and in no acute distress. Q15 minute rounds and monitoring via Security Cameras to continue. 

## 2020-07-20 NOTE — BH Assessment (Signed)
[facility](Tara-[phone]), patientremainon theirwait list

## 2020-07-20 NOTE — ED Notes (Signed)
Hourly rounding reveals patient in room. No complaints, stable, in no acute distress. Q15 minute rounds and monitoring via Security Cameras to continue. 

## 2020-07-20 NOTE — ED Notes (Signed)
Report to include Situation, Background, Assessment, and Recommendations received from Amy RN. Patient alert and oriented, warm and dry, in no acute distress. Patient denies SI, HI, AVH and pain. Patient made aware of Q15 minute rounds and security cameras for their safety. Patient instructed to come to me with needs or concerns.  

## 2020-07-20 NOTE — ED Notes (Signed)
Pt sleeping. VS will be taken once awake. 

## 2020-07-20 NOTE — ED Notes (Signed)
IVC renewed by Dr. Clapacs 

## 2020-07-20 NOTE — ED Provider Notes (Signed)
Emergency Medicine Observation Re-evaluation Note  Ernest Cook is a 29 y.o. male initially presented to the ED for complaints of Mental Health Problem Currently, the patient is pacing around his room, but no acute distress.  Physical Exam  BP 118/75 (BP Location: Left Arm)   Pulse 72   Temp 97.9 F (36.6 C) (Oral)   Resp 18   Ht 5\' 11"  (1.803 m)   Wt 90.7 kg   SpO2 100%   BMI 27.89 kg/m  Physical Exam General: Patient pacing, but has been pleasant today per report. Psych: Pleasant and redirectable today.  ED Course / MDM  No new labs over the past 24 hours.  Plan  Current plan is for placement at [facility]. Patient is under full IVC at this time.   [facility], MD 07/20/20 1425

## 2020-07-20 NOTE — ED Notes (Signed)
PT IVC papers need to be renewed on 07/20/20 by 5:12 PM.

## 2020-07-20 NOTE — ED Notes (Signed)
Pt given crackers, peanut butter and juice per request.

## 2020-07-20 NOTE — ED Notes (Signed)
Pt asleep at this time, unable to collect vitals. Will collect pt vitals once awake. 

## 2020-07-21 NOTE — ED Provider Notes (Signed)
Emergency Medicine Observation Re-evaluation Note  Ernest Cook is a 29 y.o. male, seen on rounds today.  Pt initially presented to the ED for complaints of Mental Health Problem Currently, the patient is resting calmly.  Physical Exam  BP 125/80   Pulse 75   Temp 98 F (36.7 C) (Oral)   Resp 18   Ht 5\' 11"  (1.803 m)   Wt 90.7 kg   SpO2 100%   BMI 27.89 kg/m  Physical Exam Vitals and nursing note reviewed.  HENT:     Head: Normocephalic and atraumatic.     Right Ear: External ear normal.     Left Ear: External ear normal.     Nose: Nose normal.  Cardiovascular:     Rate and Rhythm: Normal rate.  Pulmonary:     Effort: Pulmonary effort is normal.  Neurological:     Mental Status: He is alert.  Psychiatric:        Mood and Affect: Mood normal.      ED Course / MDM  EKG:    I have reviewed the labs performed to date as well as medications administered while in observation.  Recent changes in the last 24 hours include none.  Plan  Current plan is for psych hospitalization pending bed availability. Patient is under full IVC at this time.   , MD 07/21/20 1407

## 2020-07-21 NOTE — ED Notes (Signed)
Hourly rounding reveals patient in room. No complaints, stable, in no acute distress. Q15 minute rounds and monitoring via Security Cameras to continue. 

## 2020-07-21 NOTE — ED Notes (Signed)
Pt given meal tray.

## 2020-07-21 NOTE — BH Assessment (Signed)
[facility]([phone]), patientremainon theirwait list

## 2020-07-21 NOTE — ED Notes (Signed)
Patient IVC and pending placement.

## 2020-07-21 NOTE — ED Notes (Signed)
VS not taken, patient asleep 

## 2020-07-21 NOTE — ED Notes (Signed)
Message sent to pharmacy for missing dose of lithium

## 2020-07-21 NOTE — ED Notes (Signed)
Report to include Situation, Background, Assessment, and Recommendations received from Ariel RN. Patient alert and oriented, warm and dry, in no acute distress. Patient denies SI, HI, AVH and pain. Patient made aware of Q15 minute rounds and security cameras for their safety. Patient instructed to come to me with needs or concerns.  

## 2020-07-21 NOTE — ED Notes (Signed)
Pt given breakfast tray

## 2020-07-21 NOTE — ED Notes (Signed)
Pt given lunch tray.

## 2020-07-22 MED ORDER — LITHIUM CARBONATE 150 MG PO CAPS
150.0000 mg | ORAL_CAPSULE | Freq: Three times a day (TID) | ORAL | Status: DC
  Administered 2020-07-23 – 2020-07-27 (×12): 150 mg via ORAL
  Filled 2020-07-22 (×17): qty 1

## 2020-07-22 NOTE — ED Notes (Signed)
Dinner tray given

## 2020-07-22 NOTE — ED Notes (Signed)
Vitals not obtained due to put sleeping

## 2020-07-22 NOTE — ED Notes (Signed)
Hourly rounding reveals patient in room. No complaints, stable, in no acute distress. Q15 minute rounds and monitoring via Security Cameras to continue. 

## 2020-07-22 NOTE — ED Notes (Signed)
Pt given breakfast tray

## 2020-07-22 NOTE — BH Assessment (Signed)
[facility]([phone]), patientremainon theirwait list

## 2020-07-22 NOTE — ED Notes (Signed)
Pt denied wanting to take a shower at this time.

## 2020-07-22 NOTE — ED Notes (Addendum)
No vitals taken. Patient sleeping

## 2020-07-22 NOTE — ED Notes (Signed)
Pt calm and cooperative. Given graham crackers and peanut butter.

## 2020-07-22 NOTE — ED Notes (Signed)
Patient is IVC pending placement 

## 2020-07-22 NOTE — ED Provider Notes (Signed)
Emergency Medicine Observation Re-evaluation Note  Maycen VIKAS WEGMANN is a 29 y.o. male, seen on rounds today.  Pt initially presented to the ED for complaints of Mental Health Problem Currently, the patient is lying in bed comfortably, no distress has no complaints..  Physical Exam  BP 109/65 (BP Location: Right Arm)   Pulse 80   Temp 98.6 F (37 C) (Oral)   Resp 16   Ht 5\' 11"  (1.803 m)   Wt 90.7 kg   SpO2 100%   BMI 27.89 kg/m  Physical Exam General: Calm cooperative.  Resting in bed. Cardiac: Regular rate and rhythm Lungs: Clear to auscultation bilaterally Psych: Calm cooperative and pleasant today.    ED Course / MDM   No new lab work over the past 24 hours.  Plan  Current plan is for placement at [facility] hospital once a bed is available.. Patient is under full IVC at this time.   [facility], MD 07/22/20 1407

## 2020-07-22 NOTE — ED Notes (Signed)
Report to include Situation, Background, Assessment, and Recommendations received from Sarah RN. Patient alert and oriented, warm and dry, in no acute distress. Patient denies SI, HI, AVH and pain. Patient made aware of Q15 minute rounds and security cameras for their safety. Patient instructed to come to me with needs or concerns.  

## 2020-07-22 NOTE — ED Notes (Signed)
Pt requested for medication to be switched to pill form due to medication not tasting good. Pt has been consistently taking his medications without issue. Pt states "I take all my other pills in pill form can I take this one as a pill too?" Discussed with Dr. Erma Heritage and OK with this. Spoke with pharmacy about what the dose conversion would be and order switched.

## 2020-07-23 NOTE — ED Notes (Signed)
Hourly rounding reveals patient in room. No complaints, stable, in no acute distress. Q15 minute rounds and monitoring via Security Cameras to continue. 

## 2020-07-23 NOTE — ED Provider Notes (Signed)
Emergency Medicine Observation Re-evaluation Note  Ernest Cook is a 29 y.o. male, seen on rounds today.  Pt initially presented to the ED for complaints of Mental Health Problem Currently, the patient is sitting on the edge of his bed, denies complaints.  Physical Exam  BP 116/71 (BP Location: Right Arm)   Pulse 100   Temp 98.4 F (36.9 C) (Oral)   Resp 18   Ht 5\' 11"  (1.803 m)   Wt 90.7 kg   SpO2 100%   BMI 27.89 kg/m  Physical Exam Constitutional: Resting comfortably. Eyes: Conjunctivae are normal. Head: Atraumatic. Nose: No congestion/rhinnorhea. Mouth/Throat: Mucous membranes are moist. Neck: Normal ROM Cardiovascular: No cyanosis noted. Respiratory: Normal respiratory effort. Gastrointestinal: Non-distended. Genitourinary: deferred Musculoskeletal: No lower extremity tenderness nor edema. Neurologic:  Normal speech and language. No gross focal neurologic deficits are appreciated. Skin:  Skin is warm, dry and intact. No rash noted.    ED Course / MDM  EKG:    I have reviewed the labs performed to date as well as medications administered while in observation.  Recent changes in the last 24 hours include none.  Plan  Current plan is for placement at [facility] when bed available. Patient is under full IVC at this time.   [facility], MD 07/23/20 [phone]

## 2020-07-23 NOTE — ED Notes (Signed)
No v-signs patient sleeping at this time 

## 2020-07-23 NOTE — ED Notes (Signed)
Patient eating breakfast and nurse gave him hygiene items to take a shower, v/s were obtained, Patient is compliant and without signs of distress and no behavioral issues noted, Patient denies Si/hi or avh, will continue to monitor.

## 2020-07-23 NOTE — ED Notes (Signed)
Patient calm and cooperative, talking on the phone, no signs of distress.

## 2020-07-23 NOTE — ED Notes (Signed)
Meal was given with beverage.  

## 2020-07-23 NOTE — ED Notes (Signed)
VS not taken, patient asleep 

## 2020-07-23 NOTE — ED Notes (Signed)
Patient is sleeping, no signs of distress, will continue to monitor.  

## 2020-07-23 NOTE — ED Notes (Signed)
Patient ate 100% of supper and beverage, no signs of distress, laying with His shoes on, states that He feels ok, and He denies Si/hi or avh.

## 2020-07-24 NOTE — ED Notes (Signed)
Patient took all po medications, no signs of distress, patient ate 100% of breakfast and beverage, and Patient took shower, nurse and security will continue to monitor.

## 2020-07-24 NOTE — ED Notes (Signed)
IVC  [facility]  WAITLIST 

## 2020-07-24 NOTE — BH Assessment (Signed)
[facility] (Pam-[phone]), patient remains on their wait list.

## 2020-07-24 NOTE — ED Provider Notes (Signed)
Emergency Medicine Observation Re-evaluation Note  Ernest Cook is a 29 y.o. male, seen on rounds today.  Pt initially presented to the ED for complaints of Mental Health Problem Currently, the patient is resting.  Physical Exam  BP 111/72 (BP Location: Right Arm)   Pulse 67   Temp 98.4 F (36.9 C) (Oral)   Resp 18   Ht 5\' 11"  (1.803 m)   Wt 90.7 kg   SpO2 100%   BMI 27.89 kg/m  Physical Exam Constitutional:      Appearance: He is not ill-appearing or toxic-appearing.  HENT:     Head: Atraumatic.  Eyes:     Extraocular Movements: Extraocular movements intact.     Pupils: Pupils are equal, round, and reactive to light.  Pulmonary:     Effort: Pulmonary effort is normal.  Abdominal:     General: There is no distension.  Musculoskeletal:        General: No deformity.  Skin:    General: Skin is warm and dry.  Neurological:     General: No focal deficit present.     Cranial Nerves: No cranial nerve deficit.     ED Course / MDM  EKG:    I have reviewed the labs performed to date as well as medications administered while in observation.  Recent changes in the last 24 hours include none.  Plan  Current plan is for inpatient psychiatric care. Patient is under full IVC at this time.   , MD 07/24/20 [phone]

## 2020-07-24 NOTE — ED Notes (Signed)
Unable to obtain vitals due to patient sleeping. Will continue to monitor.   

## 2020-07-24 NOTE — ED Notes (Signed)
Patient is sleeping, will obtain v/s when He is awake.

## 2020-07-24 NOTE — ED Notes (Signed)
Patient ate 100% of lunch and beverage.  

## 2020-07-24 NOTE — ED Notes (Signed)
Patient is awake at this time, knocked on the door, Tech went to check on Him, patient wanted snack , Tech gave patient crackers and a drink, He is calm and cooperative.

## 2020-07-24 NOTE — ED Notes (Signed)
IVC/ Remains on [facility] Waitlist

## 2020-07-24 NOTE — ED Notes (Signed)
VOL  PENDING  PLACEMENT 

## 2020-07-24 NOTE — ED Notes (Signed)

## 2020-07-25 DIAGNOSIS — F250 Schizoaffective disorder, bipolar type: Secondary | ICD-10-CM | POA: Diagnosis not present

## 2020-07-25 MED ORDER — RISPERIDONE MICROSPHERES ER 50 MG IM SRER
50.0000 mg | INTRAMUSCULAR | Status: DC
  Administered 2020-07-25: 50 mg via INTRAMUSCULAR
  Filled 2020-07-25 (×2): qty 2

## 2020-07-25 NOTE — ED Notes (Signed)
Pt received dinner tray.

## 2020-07-25 NOTE — Consult Note (Signed)
[facility] Face-to-Face Psychiatry Consult   Reason for Consult: Follow-up consult for this gentleman with schizoaffective disorder who has been in the emergency room awaiting [facility] Referring Physician: Katrinka Blazing Patient Identification: Ernest Cook MRN:  [id number] Principal Diagnosis: Schizoaffective disorder, bipolar type (HCC) Diagnosis:  Principal Problem:   Schizoaffective disorder, bipolar type (HCC) Active Problems:   Noncompliance   Broken arm, right, closed, initial encounter   Total Time spent with patient: 20 minutes  Subjective:   Ernest Cook is a 29 y.o. male patient admitted with "I think this is done good".  HPI: Patient seen chart reviewed.  This patient has been waiting in the emergency room for an extended period of time with the hope that we would have been able to transfer him to the state hospital because of his repeated episodes of aggressive psychosis.  For the last few days he has been behaving well.  Not showing any aggression.  Nursing reports that he has been consistently polite.  On interview today the patient says he feels now that he is glad he came here and got some rest and got away from stress.  He talks about some of the things recently that have been getting on his mind and upsetting him that he thinks drove him into psychosis.  He does not have any complaints about his medicine.  Denies hallucinations and is not showing any aggression.  He has been able to talk on the phone with his act team and his family.  Past Psychiatric History: Patient has a long history of schizoaffective disorder but also a long history of stability interrupted only recently by multiple hospitalizations  Risk to Self: Suicidal Ideation: No (Unable to assess) Suicidal Intent: No (Unable to assess) Is patient at risk for suicide?: No Suicidal Plan?: No Access to Means: No What has been your use of drugs/alcohol within the last 12 months?: Unable to  assess How many times?: 0 Other Self Harm Risks: Unable to assess Triggers for Past Attempts: Unknown Intentional Self Injurious Behavior: None Risk to Others: Homicidal Ideation: Yes-Currently Present Thoughts of Harm to Others: Yes-Currently Present Comment - Thoughts of Harm to Others: Yes, per ACT Team Current Homicidal Intent: Yes-Currently Present Current Homicidal Plan: Yes-Currently Present Describe Current Homicidal Plan: Patient had knife with plans to stab someone. Access to Homicidal Means: Yes Describe Access to Homicidal Means: Access to weapons Identified Victim: No specific person History of harm to others?: Yes Assessment of Violence: None Noted Violent Behavior Description: Per ACT Team, confrontational and verbally abusive to staff  Does patient have access to weapons?: Yes (Comment) Criminal Charges Pending?: No Does patient have a court date: No Prior Inpatient Therapy: Prior Inpatient Therapy: Yes Prior Therapy Dates: 03/2020 & 03/2016 Prior Therapy Facilty/Provider(s): [facility] Reason for Treatment: Schizophrenia Prior Outpatient Therapy: Prior Outpatient Therapy: Yes Prior Therapy Dates: Current Prior Therapy Facilty/Provider([facility] Reason for Treatment: Schizophrenia Does patient have an ACCT team?: Yes Does patient have Intensive In-House Services?  : No Does patient have [facility] services? : No Does patient have P4CC services?: No  Past Medical History:  Past Medical History:  Diagnosis Date  . Schizo affective schizophrenia [facility])     Past Surgical History:  Procedure Laterality Date  . ORIF HUMERUS FRACTURE Right 06/27/2020   Procedure: OPEN REDUCTION INTERNAL FIXATION (ORIF) HUMERAL SHAFT FRACTURE;  Surgeon: Roby Lofts, MD;  Location: [facility] OR;  Service: Orthopedics;  Laterality: Right;  . SKIN GRAFT  Left unknown   Pt reports left foot and leg skin graft   Family History:  Family History  Problem Relation Age of Onset  .  Diabetes Mother   . Hypertension Mother    Family Psychiatric  History: See previous Social History:  Social History   Substance and Sexual Activity  Alcohol Use No     Social History   Substance and Sexual Activity  Drug Use Yes  . Types: Marijuana   Comment: "every now and again"    Social History   Socioeconomic History  . Marital status: Single    Spouse name: Not on file  . Number of children: Not on file  . Years of education: Not on file  . Highest education level: Not on file  Occupational History  . Not on file  Tobacco Use  . Smoking status: Current Every Day Smoker    Packs/day: 0.25    Types: Cigarettes  . Smokeless tobacco: Never Used  Substance and Sexual Activity  . Alcohol use: No  . Drug use: Yes    Types: Marijuana    Comment: "every now and again"  . Sexual activity: Yes    Birth control/protection: Condom    Comment: Pt reports he is attracted to men  Other Topics Concern  . Not on file  Social History Narrative  . Not on file   Social Determinants of Health   Financial Resource Strain:   . Difficulty of Paying Living Expenses: Not on file  Food Insecurity:   . Worried About Programme researcher, broadcasting/film/videounning Out of Food in the Last Year: Not on file  . Ran Out of Food in the Last Year: Not on file  Transportation Needs:   . Lack of Transportation (Medical): Not on file  . Lack of Transportation (Non-Medical): Not on file  Physical Activity:   . Days of Exercise per Week: Not on file  . Minutes of Exercise per Session: Not on file  Stress:   . Feeling of Stress : Not on file  Social Connections:   . Frequency of Communication with Friends and Family: Not on file  . Frequency of Social Gatherings with Friends and Family: Not on file  . Attends Religious Services: Not on file  . Active Member of Clubs or Organizations: Not on file  . Attends BankerClub or Organization Meetings: Not on file  . Marital Status: Not on file   Additional Social History:     Allergies:   Allergies  Allergen Reactions  . Penicillins Other (See Comments)    Seizure when he was 19    Labs: No results found for this or any previous visit (from the past 48 hour(s)).  Current Facility-Administered Medications  Medication Dose Route Frequency Provider Last Rate Last Admin  . aspirin EC tablet 81 mg  81 mg Oral Daily Willy Eddyobinson, Patrick, MD   81 mg at 07/25/20 1000  . atomoxetine (STRATTERA) capsule 40 mg  40 mg Oral q morning - 10a Willy Eddyobinson, Patrick, MD   40 mg at 07/25/20 1000  . benztropine (COGENTIN) tablet 1 mg  1 mg Oral QHS Willy Eddyobinson, Patrick, MD   1 mg at 07/24/20 2333  . cholecalciferol (VITAMIN D3) tablet 2,000 Units  2,000 Units Oral Daily Charm RingsLord, Jamison Y, NP   2,000 Units at 07/25/20 1000  . diphenhydrAMINE (BENADRYL) injection 50 mg  50 mg Intramuscular Q6H PRN Camaria Gerald, Jackquline DenmarkJohn T, MD   50 mg at 07/13/20 0524  . haloperidol lactate (HALDOL) injection 5 mg  5 mg Intramuscular Q6H PRN Trigo Winterbottom, Jackquline Denmark, MD   5 mg at 07/13/20 0525  . HYDROcodone-acetaminophen (NORCO/VICODIN) 5-325 MG per tablet 1 tablet  1 tablet Oral Q8H PRN Willy Eddy, MD      . lithium carbonate capsule 150 mg  150 mg Oral TID Shaune Pollack, MD   150 mg at 07/25/20 1000  . LORazepam (ATIVAN) tablet 2 mg  2 mg Oral Q6H PRN Shadiamond Koska, Jackquline Denmark, MD   2 mg at 07/12/20 9935   Or  . LORazepam (ATIVAN) injection 2 mg  2 mg Intramuscular Q6H PRN Reynolds Kittel, Jackquline Denmark, MD   2 mg at 07/13/20 0525  . LORazepam (ATIVAN) tablet 2 mg  2 mg Oral Once Kenda Kloehn T, MD      . methocarbamol (ROBAXIN) tablet 500 mg  500 mg Oral Q6H PRN Willy Eddy, MD      . risperiDONE (RISPERDAL) 1 MG/ML oral solution 3 mg  3 mg Oral QHS Willy Eddy, MD   3 mg at 07/24/20 2333  . risperiDONE microspheres (RISPERDAL CONSTA) injection 50 mg  50 mg Intramuscular Q14 Days Christon Gallaway, Jackquline Denmark, MD       Current Outpatient Medications  Medication Sig Dispense Refill  . aspirin EC 81 MG EC tablet Take 1 tablet (81 mg  total) by mouth daily. Swallow whole. 30 tablet 0  . atomoxetine (STRATTERA) 40 MG capsule Take 40 mg by mouth every morning.     . benztropine (COGENTIN) 1 MG tablet Take 1 mg by mouth at bedtime.     . cholecalciferol (VITAMIN D) 25 MCG tablet Take 2 tablets (2,000 Units total) by mouth daily. 60 tablet 2  . HYDROcodone-acetaminophen (NORCO/VICODIN) 5-325 MG tablet Take 1 tablet by mouth every 8 (eight) hours as needed for severe pain. 10 tablet 0  . lithium carbonate 150 MG capsule Take 3 capsules (450 mg total) by mouth 2 (two) times daily with a meal. 80 capsule 0  . lithium carbonate 300 MG capsule Take 1 capsule (300 mg total) by mouth at bedtime. 14 capsule 0  . methocarbamol (ROBAXIN) 500 MG tablet Take 1 tablet (500 mg total) by mouth every 6 (six) hours as needed for muscle spasms. 20 tablet 0  . risperiDONE (RISPERDAL) 3 MG tablet Take 1 tablet (3 mg total) by mouth at bedtime. 30 tablet 1    Musculoskeletal: Strength & Muscle Tone: within normal limits Gait & Station: normal Patient leans: N/A  Psychiatric Specialty Exam: Physical Exam Vitals and nursing note reviewed.  Constitutional:      Appearance: He is well-developed.  HENT:     Head: Normocephalic and atraumatic.  Eyes:     Conjunctiva/sclera: Conjunctivae normal.     Pupils: Pupils are equal, round, and reactive to light.  Cardiovascular:     Heart sounds: Normal heart sounds.  Pulmonary:     Effort: Pulmonary effort is normal.  Abdominal:     Palpations: Abdomen is soft.  Musculoskeletal:        General: Normal range of motion.     Cervical back: Normal range of motion.  Skin:    General: Skin is warm and dry.  Neurological:     General: No focal deficit present.     Mental Status: He is alert.  Psychiatric:        Mood and Affect: Mood normal.        Thought Content: Thought content normal.     Review of Systems  Constitutional: Negative.  HENT: Negative.   Eyes: Negative.   Respiratory:  Negative.   Cardiovascular: Negative.   Gastrointestinal: Negative.   Musculoskeletal: Negative.   Skin: Negative.   Neurological: Negative.   Psychiatric/Behavioral: Negative.     Blood pressure 112/71, pulse 88, temperature 98.1 F (36.7 C), temperature source Oral, resp. rate 16, height 5\' 11"  (1.803 m), weight 90.7 kg, SpO2 100 %.Body mass index is 27.89 kg/m.  General Appearance: Casual  Eye Contact:  Fair  Speech:  Clear and Coherent  Volume:  Normal  Mood:  Euthymic  Affect:  Congruent  Thought Process:  Goal Directed  Orientation:  Full (Time, Place, and Person)  Thought Content:  Logical  Suicidal Thoughts:  No  Homicidal Thoughts:  No  Memory:  Immediate;   Fair Recent;   Fair Remote;   Fair  Judgement:  Fair  Insight:  Fair  Psychomotor Activity:  Normal  Concentration:  Concentration: Fair  Recall:  of Knowledge:  Fair  Language:  Fair  Akathisia:  Negative  Handed:  Right  AIMS (if indicated):     Assets:  Desire for Improvement Housing Resilience Social Support  ADL's:  Intact  Cognition:  WNL  Sleep:        Treatment Plan Summary: Plan Lithium level tomorrow.  Patient reminded me that he is due for his Risperdal injection so we will give him that tonight.  Reassess over the next day and talk to his act team.  If we cannot get [facility] into [facility] he may be getting well enough that we could consider discharge  Disposition: Supportive therapy provided about ongoing stressors. Discussed crisis plan, support from social network, calling 911, coming to the Emergency Department, and calling Suicide Hotline.  [employer], MD 07/25/2020 5:11 PM

## 2020-07-25 NOTE — ED Notes (Signed)
Patient was given long acting Risperidone injection in left deltoid. No complaints from patient.

## 2020-07-25 NOTE — ED Notes (Signed)
IVC/  PENDING  PLACEMENT 

## 2020-07-25 NOTE — BH Assessment (Signed)
[facility] ([phone]), patient remains on wait list.

## 2020-07-25 NOTE — ED Provider Notes (Signed)
Emergency Medicine Observation Re-evaluation Note  Ernest Cook is a 29 y.o. male, seen on rounds today.  Pt initially presented to the ED for complaints of Mental Health Problem Currently, the patient is resting comfortably.  Physical Exam  BP 112/71 (BP Location: Left Arm)   Pulse 88   Temp 98.1 F (36.7 C) (Oral)   Resp 16   Ht 5\' 11"  (1.803 m)   Wt 90.7 kg   SpO2 100%   BMI 27.89 kg/m  Physical Exam General: No acute distress Cardiac: Well-perfused extremities Lungs: No respiratory distress Psych: Appropriate mood and affect  ED Course / MDM  EKG:    I have reviewed the labs performed to date as well as medications administered while in observation.  Recent changes in the last 24 hours include none.  Plan  Current plan is for placement. Patient is under full IVC at this time.   , MD 07/25/20 [phone]

## 2020-07-26 NOTE — ED Notes (Addendum)
Pt given breakfast tray and orange juice. Used shower items removed.

## 2020-07-26 NOTE — ED Provider Notes (Signed)
Emergency Medicine Observation Re-evaluation Note  Ernest Cook is a 29 y.o. male, seen on rounds today.  Pt initially presented to the ED for complaints of Mental Health Problem Currently, the patient is calm and has no acute complaints.  Physical Exam  BP 116/84 (BP Location: Left Arm)   Pulse 71   Temp 98.5 F (36.9 C) (Oral)   Resp 18   Ht 5\' 11"  (1.803 m)   Wt 90.7 kg   SpO2 100%   BMI 27.89 kg/m  Physical Exam General: Alert and oriented. Cardiac: Good peripheral perfusion. Lungs: Normal respiratory effort. Psych: Calm and behaving appropriately.  ED Course / MDM  I have reviewed the labs performed to date as well as medications administered while in observation.  There have been no recent changes in the last 24 hours.  Plan  Current plan is for disposition per psychiatry team. Patient is under full IVC at this time.   , MD 07/26/20 [phone]

## 2020-07-26 NOTE — BH Assessment (Signed)
[facility] ([phone]), patient remains on their wait list.

## 2020-07-26 NOTE — ED Notes (Signed)
Pt given shower supplies. Pt in shower at this time

## 2020-07-26 NOTE — ED Notes (Signed)
Hourly rounding completed at this time, patient currently asleep in room. No complaints, stable, and in no acute distress. Q15 minute rounds and monitoring via Security Cameras to continue. 

## 2020-07-26 NOTE — ED Notes (Signed)
Hourly rounding completed at this time, patient currently asleep in room. No complaints, stable, and in no acute distress. Q15 minute rounds and monitoring via Rover and Officer to continue. 

## 2020-07-26 NOTE — ED Notes (Signed)
Pt states he does not want a snack tonight.

## 2020-07-26 NOTE — ED Notes (Signed)
Unable to obtain vitals due to patient sleeping. Will continue to monitor.   

## 2020-07-26 NOTE — ED Notes (Signed)
Pt has good conversation with this nurse at this time about tv shows and basketball. Pt reports understanding of plan for night

## 2020-07-26 NOTE — ED Notes (Signed)
Hourly rounding completed at this time, patient currently awake in room. No complaints, stable, and in no acute distress. Q15 minute rounds and monitoring via Security Cameras to continue. 
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
## 2020-07-26 NOTE — ED Notes (Signed)
Report received from Dorothy, RN including  Situation, Background, Assessment, and Recommendations. Patient alert and oriented, warm and dry, in no acute distress. Patient denies SI, HI, AVH and pain. Patient made aware of Q15 minute rounds and security cameras for their safety. Patient instructed to come to this nurse with needs or concerns. 

## 2020-07-27 LAB — LITHIUM LEVEL: Lithium Lvl: 0.25 mmol/L — ABNORMAL LOW (ref 0.60–1.20)

## 2020-07-27 MED ORDER — LITHIUM CARBONATE ER 450 MG PO TBCR
450.0000 mg | EXTENDED_RELEASE_TABLET | Freq: Two times a day (BID) | ORAL | Status: DC
  Administered 2020-07-27 – 2020-08-01 (×10): 450 mg via ORAL
  Filled 2020-07-27 (×10): qty 1
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
## 2020-07-27 NOTE — ED Provider Notes (Signed)
Emergency Medicine Observation Re-evaluation Note  Ernest Cook is a 29 y.o. male, seen on rounds today.  Pt initially presented to the ED for complaints of Mental Health Problem Currently, the patient is resting calmly.  Physical Exam  BP 114/69 (BP Location: Left Arm)   Pulse 91   Temp 98.5 F (36.9 C) (Oral)   Resp 16   Ht 5\' 11"  (1.803 m)   Wt 90.7 kg   SpO2 100%   BMI 27.89 kg/m  Physical Exam Vitals and nursing note reviewed.  HENT:     Head: Normocephalic and atraumatic.     Right Ear: External ear normal.     Left Ear: External ear normal.     Nose: Nose normal.  Cardiovascular:     Rate and Rhythm: Normal rate.  Pulmonary:     Effort: No respiratory distress.  Neurological:     Mental Status: Mental status is at baseline.  Psychiatric:        Mood and Affect: Mood normal.      ED Course / MDM  EKG:    I have reviewed the labs performed to date as well as medications administered while in observation.  Recent changes in the last 24 hours include none.  Plan  Current plan is for awaiting psych bed. Patient is under full IVC at this time.   , MD 07/27/20 1535

## 2020-07-27 NOTE — BH Assessment (Signed)
Pt's mother called to discuss concerns she has regarding pt being in the hospital since November 19 and thoughts that he will get worse due to being stuck in a room and having limited programming/interactions. Pt's mother expressed the belief that pt can now express his feelings better and that she thinks he could be successful if he were to return home; she shares thoughts that it's a waste of space that someone else could be occupying for him to remain here.  Pt's mother stated she had been in contact with pt's ACT Team and had requested they come see pt at the hospital but stated they have not yet done so and that they have given her multiple excuses as to why they cannot do so. She states they haven't been answering the crisis line or return her calls. Advised pt's mother document when she calls pt's ACT Team due to her concerns that they are not monitoring pt, even while in the hospital, and that they are not being forthcoming with her.  Pt's mother expressed concerns that pt's mood will get worse if he continues to stay in the hospital, especially if he misses Christmas b/c he's already missed Thanksgiving and he's family-oriented. Ensured pt's mother I would document this conversation so it would be accessible to pt's team to read and she expressed appreciation.  [place], mother: [phone]   Pt's mother called back a short time later and stated she had just gotten off of the phone with pt's ACT Team and that they reported to her that it was pt's psychiatrist, Dr. Toni Amend, to told them that he did not believe pt was ready to be d/c home. Pt's mother stated she had talked to one of pt's nurses (Amy, RN) earlier and that she had informed pt's mother that it was pt's ACT Team who had told Dr. Toni Amend that pt was not ready to be d/c. Clinician pulled up Dr. Shary Key most recent note, dated 07/26/2020, and informed pt's mother that it appears that the information that the nurse provided her  earlier was accurate. Clinician advised pt's mother speak with pt's ACT Team to ensure it wasn't a different team member that spoke to Dr. Toni Amend but that what she was told by pt's nurse is what is written in Dr. Shary Key note. Pt's mother expressed a belief that it was the ACT Team that was being dishonest with her but that she wanted to ensure this was so.

## 2020-07-27 NOTE — ED Notes (Signed)
No v-signs patient sleeping at this time 

## 2020-07-27 NOTE — ED Notes (Signed)
Hourly rounding completed at this time, patient currently asleep in room. No complaints, stable, and in no acute distress. Q15 minute rounds and monitoring via Security Cameras to continue. 

## 2020-07-27 NOTE — ED Notes (Addendum)
Hourly rounding completed at this time, patient currently asleep in room. No complaints, stable, and in no acute distress. Q15 minute rounds and monitoring via Security Cameras to continue. 

## 2020-07-27 NOTE — ED Notes (Signed)
Noted that pt has order for lithium level due, pt refuses blood draw at this time but agrees to take medications. Pt is not as talkative and increasingly withdrawn and guarded as compared to when this nurse arrived to shift.

## 2020-07-27 NOTE — BH Assessment (Signed)
[facility]-[phone]),patient remains on their wait list. 

## 2020-07-27 NOTE — ED Notes (Signed)
Attempt made to collect lithium level  -  He is sleeping  Verbally refused lab draw at this time

## 2020-07-27 NOTE — ED Notes (Signed)
Pt asleep at this time, unable to collect vitals. Will collect pt vitals once awake. 

## 2020-07-28 NOTE — ED Notes (Signed)
Pt calm and pleasant this morning. Takes meds without issue. Denies pain in his arm. Pt ate 100% of meal tray. Would like to shower. Denies further needs.

## 2020-07-28 NOTE — ED Notes (Signed)
Pt sleeping. Will obtain vitals when awake.

## 2020-07-28 NOTE — BH Assessment (Signed)
This Clinical research associate contacted [facility](Shane-[phone]),patient remains on their wait list.

## 2020-07-28 NOTE — BH Assessment (Signed)
[facility](Robinette-[phone]), patientremainon theirwait list

## 2020-07-28 NOTE — ED Notes (Signed)
VS to be obtained when pt is awake 

## 2020-07-28 NOTE — ED Notes (Signed)
Hourly rounding reveals patient in room. No complaints, stable, in no acute distress. Q15 minute rounds and monitoring via Security Cameras to continue. 

## 2020-07-28 NOTE — ED Notes (Signed)
Report to include Situation, Background, Assessment, and Recommendations received from Sarah RN. Patient alert and oriented, warm and dry, in no acute distress. Patient denies SI, HI, AVH and pain. Patient made aware of Q15 minute rounds and security cameras for their safety. Patient instructed to come to me with needs or concerns.  

## 2020-07-28 NOTE — ED Provider Notes (Signed)
Emergency Medicine Observation Re-evaluation Note  Ernest Cook is a 29 y.o. male, seen on rounds today.  Pt initially presented to the ED for complaints of Mental Health Problem Currently, the patient is resting comfortably.  Physical Exam  BP 119/67 (BP Location: Left Arm)   Pulse 94   Temp 98.1 F (36.7 C) (Oral)   Resp 16   Ht 5\' 11"  (1.803 m)   Wt 90.7 kg   SpO2 99%   BMI 27.89 kg/m  Physical Exam Constitutional:      Appearance: He is not ill-appearing or toxic-appearing.  HENT:     Head: Atraumatic.  Eyes:     Extraocular Movements: Extraocular movements intact.     Pupils: Pupils are equal, round, and reactive to light.  Pulmonary:     Effort: Pulmonary effort is normal.  Abdominal:     General: There is no distension.  Musculoskeletal:        General: No deformity.  Skin:    General: Skin is warm and dry.  Neurological:     General: No focal deficit present.     Cranial Nerves: No cranial nerve deficit.      ED Course / MDM  EKG:    I have reviewed the labs performed to date as well as medications administered while in observation.  Recent changes in the last 24 hours include none.  Plan  Current plan is for inpatient psychiatric care. Patient is under full IVC at this time.   , MD 07/28/20 [phone]

## 2020-07-28 NOTE — ED Notes (Signed)
Pt given meal tray and sprite. 

## 2020-07-29 NOTE — ED Notes (Signed)
Hourly rounding reveals patient in room. No complaints, stable, in no acute distress. Q15 minute rounds and monitoring via Security Cameras to continue. 

## 2020-07-29 NOTE — ED Notes (Signed)
Patient is IVC pending placement 

## 2020-07-29 NOTE — ED Provider Notes (Signed)
Emergency Medicine Observation Re-evaluation Note  Ernest Cook is a 29 y.o. male, seen on rounds today.  Pt initially presented to the ED for complaints of Mental Health Problem Currently, the patient is calm, watching television in bed.  Physical Exam  BP 118/79   Pulse 87   Temp 98.4 F (36.9 C) (Oral)   Resp 18   Ht 5\' 11"  (1.803 m)   Wt 90.7 kg   SpO2 99%   BMI 27.89 kg/m  Physical Exam General: Alert, pleasant in no distress without complaint Cardiac: Appears well perfused Lungs: Clear speech no distress Psych: He is calm, no concerns or agitation noted at this time  ED Course / MDM  EKG:    I have reviewed the labs performed to date as well as medications administered while in observation.  Recent changes in the last 24 hours include awaiting inpatient psychiatric care.  Plan  Current plan is for awaiting inpatient psychiatric care. Patient is under full IVC at this time.   , MD 07/29/20 [phone]

## 2020-07-29 NOTE — ED Notes (Signed)
Meal tray given 

## 2020-07-29 NOTE — ED Notes (Signed)
Lunch tray given. 

## 2020-07-29 NOTE — ED Notes (Signed)
VS not taken, patient asleep 

## 2020-07-29 NOTE — ED Notes (Signed)
Pt using the phone. 

## 2020-07-29 NOTE — ED Notes (Signed)
Pt asleep. VS will be taken once pt is awake.

## 2020-07-30 NOTE — ED Notes (Signed)
Snack and beverage given. 

## 2020-07-30 NOTE — ED Notes (Signed)
No v-signs patient sleeping at this time 

## 2020-07-30 NOTE — ED Notes (Signed)
Patient sleeping, vital signs deferred. 

## 2020-07-30 NOTE — ED Provider Notes (Signed)
Emergency Medicine Observation Re-evaluation Note  Ernest Cook is a 29 y.o. male, seen on rounds today.  Pt initially presented to the ED for complaints of Mental Health Problem Currently, the patient is resting comfortably in bed talking on the phone initially.  No complaints..  Physical Exam  BP 132/73   Pulse 91   Temp 98.6 F (37 C) (Oral)   Resp 17   Ht 5\' 11"  (1.803 m)   Wt 90.7 kg   SpO2 99%   BMI 27.89 kg/m  Physical Exam General: Calm cooperative Cardiac: Regular rate and rhythm around 80 bpm Lungs: Clear to auscultation bilaterally Psych: Calm, pleasant  ED Course / MDM   No new lab work over the last 24 hours.  Plan  Current plan is for placement at [facility], currently on wait list. Patient is under full IVC at this time.   [facility], MD 07/30/20 1133

## 2020-07-30 NOTE — ED Notes (Signed)
Pt given meal tray.

## 2020-07-30 NOTE — BH Assessment (Signed)
This Clinical research associate [phone]),patient remains on their wait list.

## 2020-07-30 NOTE — ED Notes (Signed)
Pt observed sleeping   VS to be obtained when he awakens  

## 2020-07-30 NOTE — ED Notes (Signed)
Report to include Situation, Background, Assessment, and Recommendations received from William RN. Patient alert and oriented, warm and dry, in no acute distress. Patient denies SI, HI, AVH and pain. Patient made aware of Q15 minute rounds and security cameras for their safety. Patient instructed to come to me with needs or concerns.  

## 2020-07-30 NOTE — BH Assessment (Signed)
[facility]([phone]), patientremainson theirwait list

## 2020-07-30 NOTE — ED Notes (Signed)
IVC  [facility]  WAITLIST 

## 2020-07-30 NOTE — BH Assessment (Signed)
[facility]([phone]), patientremainon theirwait list

## 2020-07-31 NOTE — ED Notes (Signed)
Pt becoming agitated, banging on the walls in the 3 bed unit.  Security and Dr. Toni Amend at bedside.

## 2020-07-31 NOTE — ED Notes (Signed)
Hourly rounding reveals patient in room. No complaints, stable, in no acute distress. Q15 minute rounds and monitoring via Security Cameras to continue. 

## 2020-07-31 NOTE — ED Notes (Signed)
VS not taken, patient asleep 

## 2020-07-31 NOTE — ED Notes (Signed)
Pharmacy sent wrong med, called again for the [facility]

## 2020-07-31 NOTE — Consult Note (Signed)
[facility]   Reason for Consult: Follow-up consult for 29 year old man with schizoaffective disorder Referring Physician: Marcello MooresIsaac Patient Identification: Oneita Krasevis M Rackley MRN:  [id number] Principal Diagnosis: Schizoaffective disorder, bipolar type (HCC) Diagnosis:  Principal Problem:   Schizoaffective disorder, bipolar type (HCC) Active Problems:   Noncompliance   Broken arm, right, closed, initial encounter   Total Time spent with patient: 30 minutes  Subjective:   Stephanos Melrose NakayamaM Brennan is a 29 y.o. male patient admitted with "I want to go".  HPI: Patient seen chart reviewed.  This is an updated note for this 29 year old man with schizoaffective disorder who has been waiting an extended time in the emergency room holding area.  Patient became irritable today and expressed his frustration and wanted to talk about discharge options.  He was very angry when I went back and talk with him and had been shouting and kicking on the wall.  During the time we were together he did stop kicking the wall but dominated the conversation shouting throughout changing topics frequently.  Clearly very angry.  Did not threaten me did not show physical aggression towards me.  I have made an attempt to try and discussed with him his mental health problems but his pressured speech intervened every time and he did not want to listen to what I had to say.  Past Psychiatric History: Long history of schizoaffective disorder.  Recent decompensation with several hospitalizations and reports of violent behavior outside the hospital  Risk to Self: Suicidal Ideation: No (Unable to assess) Suicidal Intent: No (Unable to assess) Is patient at risk for suicide?: No Suicidal Plan?: No Access to Means: No What has been your use of drugs/alcohol within the last 12 months?: Unable to assess How many times?: 0 Other Self Harm Risks: Unable to assess Triggers for Past Attempts: Unknown Intentional Self  Injurious Behavior: None Risk to Others: Homicidal Ideation: Yes-Currently Present Thoughts of Harm to Others: Yes-Currently Present Comment - Thoughts of Harm to Others: Yes, per ACT Team Current Homicidal Intent: Yes-Currently Present Current Homicidal Plan: Yes-Currently Present Describe Current Homicidal Plan: Patient had knife with plans to stab someone. Access to Homicidal Means: Yes Describe Access to Homicidal Means: Access to weapons Identified Victim: No specific person History of harm to others?: Yes Assessment of Violence: None Noted Violent Behavior Description: Per ACT Team, confrontational and verbally abusive to staff  Does patient have access to weapons?: Yes (Comment) Criminal Charges Pending?: No Does patient have a court date: No Prior Inpatient Therapy: Prior Inpatient Therapy: Yes Prior Therapy Dates: 03/2020 & 03/2016 [facility] Reason for Treatment: Schizophrenia Prior Outpatient Therapy: Prior Outpatient Therapy: Yes Prior Therapy Dates: Current Prior Therapy [facility] Reason for Treatment: Schizophrenia Does patient have an ACCT team?: Yes Does patient have Intensive In-House Services?  : No Does patient have [facility] services? : No Does patient have P4CC services?: No  Past Medical History:  Past Medical History:  Diagnosis Date  . Schizo affective schizophrenia [facility](HCC)     Past Surgical History:  Procedure Laterality Date  . ORIF HUMERUS FRACTURE Right 06/27/2020   Procedure: OPEN REDUCTION INTERNAL FIXATION (ORIF) HUMERAL SHAFT FRACTURE;  Surgeon: Roby LoftsHaddix, Kevin P, MD;  Location: [facility] OR;  Service: Orthopedics;  Laterality: Right;  . SKIN GRAFT Left unknown   Pt reports left foot and leg skin graft   Family History:  Family History  Problem Relation Age of Onset  . Diabetes Mother   .  Hypertension Mother    Family Psychiatric  History: See previous Social History:  Social History    Substance and Sexual Activity  Alcohol Use No     Social History   Substance and Sexual Activity  Drug Use Yes  . Types: Marijuana   Comment: "every now and again"    Social History   Socioeconomic History  . Marital status: Single    Spouse name: Not on file  . Number of children: Not on file  . Years of education: Not on file  . Highest education level: Not on file  Occupational History  . Not on file  Tobacco Use  . Smoking status: Current Every Day Smoker    Packs/day: 0.25    Types: Cigarettes  . Smokeless tobacco: Never Used  Substance and Sexual Activity  . Alcohol use: No  . Drug use: Yes    Types: Marijuana    Comment: "every now and again"  . Sexual activity: Yes    Birth control/protection: Condom    Comment: Pt reports he is attracted to men  Other Topics Concern  . Not on file  Social History Narrative  . Not on file   Social Determinants of Health   Financial Resource Strain: Not on file  Food Insecurity: Not on file  Transportation Needs: Not on file  Physical Activity: Not on file  Stress: Not on file  Social Connections: Not on file   Additional Social History:    Allergies:   Allergies  Allergen Reactions  . Penicillins Other (See Comments)    Seizure when he was 19    Labs: No results found for this or any previous visit (from the past 48 hour(s)).  Current Facility-Administered Medications  Medication Dose Route Frequency Provider Last Rate Last Admin  . aspirin EC tablet 81 mg  81 mg Oral Daily Willy Eddy, MD   81 mg at 07/31/20 0956  . atomoxetine (STRATTERA) capsule 40 mg  40 mg Oral q morning - 10a Willy Eddy, MD   40 mg at 07/30/20 0932  . benztropine (COGENTIN) tablet 1 mg  1 mg Oral QHS Willy Eddy, MD   1 mg at 07/31/20 0023  . cholecalciferol (VITAMIN D3) tablet 2,000 Units  2,000 Units Oral Daily Charm Rings, NP   2,000 Units at 07/31/20 0956  . diphenhydrAMINE (BENADRYL) injection 50 mg  50 mg  Intramuscular Q6H PRN Coal Nearhood T, MD   50 mg at 07/13/20 0524  . haloperidol lactate (HALDOL) injection 5 mg  5 mg Intramuscular Q6H PRN Tola Meas, Jackquline Denmark, MD   5 mg at 07/13/20 0525  . HYDROcodone-acetaminophen (NORCO/VICODIN) 5-325 MG per tablet 1 tablet  1 tablet Oral Q8H PRN Willy Eddy, MD      . lithium carbonate (ESKALITH) CR tablet 450 mg  450 mg Oral Q12H Jameria Bradway, Jackquline Denmark, MD   450 mg at 07/31/20 1002  . LORazepam (ATIVAN) tablet 2 mg  2 mg Oral Q6H PRN Daianna Vasques, Jackquline Denmark, MD   2 mg at 07/30/20 0934   Or  . LORazepam (ATIVAN) injection 2 mg  2 mg Intramuscular Q6H PRN Emanuel Campos, Jackquline Denmark, MD   2 mg at 07/13/20 0525  . LORazepam (ATIVAN) tablet 2 mg  2 mg Oral Once [place] T, MD      . methocarbamol (ROBAXIN) tablet 500 mg  500 mg Oral Q6H PRN Willy Eddy, MD      . risperiDONE (RISPERDAL) 1 MG/ML oral solution 3 mg  3 mg Oral QHS Willy Eddy, MD   3 mg at 07/31/20 0020  . risperiDONE microspheres (RISPERDAL CONSTA) injection 50 mg  50 mg Intramuscular Q14 Days Cybil Senegal, Jackquline Denmark, MD   50 mg at 07/25/20 2305   Current Outpatient Medications  Medication Sig Dispense Refill  . aspirin EC 81 MG EC tablet Take 1 tablet (81 mg total) by mouth daily. Swallow whole. 30 tablet 0  . atomoxetine (STRATTERA) 40 MG capsule Take 40 mg by mouth every morning.     . benztropine (COGENTIN) 1 MG tablet Take 1 mg by mouth at bedtime.     . cholecalciferol (VITAMIN D) 25 MCG tablet Take 2 tablets (2,000 Units total) by mouth daily. 60 tablet 2  . HYDROcodone-acetaminophen (NORCO/VICODIN) 5-325 MG tablet Take 1 tablet by mouth every 8 (eight) hours as needed for severe pain. 10 tablet 0  . lithium carbonate 150 MG capsule Take 3 capsules (450 mg total) by mouth 2 (two) times daily with a meal. 80 capsule 0  . lithium carbonate 300 MG capsule Take 1 capsule (300 mg total) by mouth at bedtime. 14 capsule 0  . methocarbamol (ROBAXIN) 500 MG tablet Take 1 tablet (500 mg total) by mouth every 6  (six) hours as needed for muscle spasms. 20 tablet 0  . risperiDONE (RISPERDAL) 3 MG tablet Take 1 tablet (3 mg total) by mouth at bedtime. 30 tablet 1    Musculoskeletal: Strength & Muscle Tone: within normal limits Gait & Station: normal Patient leans: N/A  Psychiatric Specialty Exam: Physical Exam Vitals and nursing note reviewed.  Constitutional:      Appearance: He is well-developed and well-nourished.  HENT:     Head: Normocephalic and atraumatic.  Eyes:     Conjunctiva/sclera: Conjunctivae normal.     Pupils: Pupils are equal, round, and reactive to light.  Cardiovascular:     Heart sounds: Normal heart sounds.  Pulmonary:     Effort: Pulmonary effort is normal.  Abdominal:     Palpations: Abdomen is soft.  Musculoskeletal:        General: Normal range of motion.     Cervical back: Normal range of motion.  Skin:    General: Skin is warm and dry.  Neurological:     General: No focal deficit present.     Mental Status: He is alert.  Psychiatric:        Attention and Perception: He is inattentive.        Mood and Affect: Affect is labile and angry.        Speech: Speech is rapid and pressured and tangential.        Behavior: Behavior is agitated.        Thought Content: Thought content is paranoid. Thought content does not include homicidal or suicidal ideation.        Cognition and Memory: Cognition is impaired. Memory is impaired.        Judgment: Judgment is impulsive.     Review of Systems  Constitutional: Negative.   HENT: Negative.   Eyes: Negative.   Respiratory: Negative.   Cardiovascular: Negative.   Gastrointestinal: Negative.   Musculoskeletal: Negative.   Skin: Negative.   Neurological: Negative.   Psychiatric/Behavioral: Negative.     Blood pressure 131/76, pulse 96, temperature 97.6 F (36.4 C), temperature source Axillary, resp. rate 15, height 5\' 11"  (1.803 m), weight 90.7 kg, SpO2 100 %.Body mass index is 27.89 kg/m.  General Appearance:  Casual  Eye Contact:  Fair  Speech:  Pressured  Volume:  Increased  Mood:  Angry and Irritable  Affect:  Congruent and Labile  Thought Process:  Disorganized  Orientation:  Full (Time, Place, and Person)  Thought Content:  Illogical, Paranoid Ideation, Rumination and Tangential  Suicidal Thoughts:  No  Homicidal Thoughts:  No  Memory:  Immediate;   Fair Recent;   Poor Remote;   Fair  Judgement:  Impaired  Insight:  Lacking  Psychomotor Activity:  Restlessness  Concentration:  Concentration: Poor  Recall:  [employer] of Knowledge:  Fair  Language:  Fair  Akathisia:  No  Handed:  Right  AIMS (if indicated):     Assets:  Desire for Improvement Physical Health  ADL's:  Impaired  Cognition:  Impaired,  Mild  Sleep:        Treatment Plan Summary: Daily contact with patient to assess and evaluate symptoms and progress in treatment, Medication management and Plan 29 year old man with schizoaffective disorder.  He has remained in the emergency room holding area because the recommendation of our team in consultation with his act team is that he go to the state hospital for longer term hospitalization.  This is based on multiple hospitalizations with violent aggressive behavior repeatedly and poor recent functioning outside the hospital even when on medication.  As is unfortunately the case in the system these days it is a very long slow uncertain process referring someone to the state hospital.  At this point we do not know when that might happen.  I have spoken to several members of his act team.  Some of them strongly emphasized his violence outside the hospital and feel without question that he needs longer hospitalization.  Some of them seem to question that a bit.  It was suggested that I speak with Dr. Alla German with the act team and I am glad to do that anytime he wishes to give me a call.  Meanwhile I have increased his lithium dose after his last blood level.  I am going to review his  medicine and will probably be adding Depakote back again.  At this point we do not yet have a plan to discharge him home although I remain open to continuing to discuss that.  I understand how frustrating it must be to have this extended hospitalization and did not try to make the patient feel that he was unjustified.  Disposition: Recommend psychiatric Inpatient admission when medically cleared.  Mordecai Rasmussen, MD 07/31/2020 12:46 PM

## 2020-07-31 NOTE — ED Notes (Signed)
PT talking on phone. Cooperative to take medicines

## 2020-07-31 NOTE — ED Notes (Signed)
Checked again for Consuelo Pandy, still not here

## 2020-07-31 NOTE — ED Notes (Signed)
PT denies SI/HI at this time. PT calm, cooperative and pleasant.

## 2020-07-31 NOTE — ED Provider Notes (Signed)
Emergency Medicine Observation Re-evaluation Note  Ernest Cook is a 29 y.o. male, seen on rounds today.  Pt initially presented to the ED for complaints of Mental Health Problem Currently, the patient is calm, has no complained of any arm pain. Nursing reports he has been pleasant.  Physical Exam  BP 131/76   Pulse 96   Temp 97.6 F (36.4 C) (Axillary)   Resp 15   Ht 5\' 11"  (1.803 m)   Wt 90.7 kg   SpO2 100%   BMI 27.89 kg/m  Physical Exam General: NAD Cardiac: Well perfused Lungs: Normal WOB, no distress Psych: Calm, resting  ED Course / MDM  EKG:    I have reviewed the labs performed to date as well as medications administered while in observation.  Recent changes in the last 24 hours include none.  Plan  Current plan is for IVC, seeking placement - likely [facility]. Patient is under full IVC at this time.   , MD 07/31/20 1056

## 2020-07-31 NOTE — ED Notes (Signed)
Called pharmacy to sent Strattera

## 2020-07-31 NOTE — ED Notes (Signed)
Informed MD CLapacs that pt wishes to speak with him

## 2020-08-01 DIAGNOSIS — Z0289 Encounter for other administrative examinations: Secondary | ICD-10-CM

## 2020-08-01 MED ORDER — METHOCARBAMOL 500 MG PO TABS: 500.0000 mg | ORAL_TABLET | Freq: Four times a day (QID) | ORAL | 1 refills | Status: DC | PRN

## 2020-08-01 MED ORDER — RISPERIDONE 3 MG PO TABS: 3.0000 mg | ORAL_TABLET | Freq: Every day | ORAL | 1 refills | Status: DC

## 2020-08-01 MED ORDER — ATOMOXETINE HCL 40 MG PO CAPS: 40.0000 mg | ORAL_CAPSULE | Freq: Every morning | ORAL | 1 refills | Status: DC

## 2020-08-01 MED ORDER — RISPERIDONE MICROSPHERES ER 50 MG IM SRER: 50.0000 mg | INTRAMUSCULAR | 1 refills | Status: DC

## 2020-08-01 MED ORDER — LITHIUM CARBONATE ER 450 MG PO TBCR: 450.0000 mg | EXTENDED_RELEASE_TABLET | Freq: Two times a day (BID) | ORAL | 1 refills | Status: DC

## 2020-08-01 MED ORDER — ASPIRIN 81 MG PO TBEC: 81.0000 mg | DELAYED_RELEASE_TABLET | Freq: Every day | ORAL | 1 refills | Status: DC

## 2020-08-01 MED ORDER — BENZTROPINE MESYLATE 1 MG PO TABS: 1.0000 mg | ORAL_TABLET | Freq: Every day | ORAL | 1 refills | Status: DC

## 2020-08-01 MED ORDER — VITAMIN D3 25 MCG PO TABS: 2000.0000 [IU] | ORAL_TABLET | Freq: Every day | ORAL | 1 refills | Status: AC

## 2020-08-01 NOTE — ED Notes (Signed)
Patient up early this morning, showered and changed into clean clothes. Oral hygiene supplies provided

## 2020-08-01 NOTE — Consult Note (Signed)
[facility] Face-to-Face Psychiatry Consult   Reason for Consult: Consult and follow-up for this 29 year old man with schizoaffective disorder who has been in the emergency room for several weeks Referring Physician: Scotty Court Patient Identification: Ernest Cook MRN:  [id number] Principal Diagnosis: Schizoaffective disorder, bipolar type (HCC) Diagnosis:  Principal Problem:   Schizoaffective disorder, bipolar type (HCC) Active Problems:   Noncompliance   Broken arm, right, closed, initial encounter   Total Time spent with patient: 45 minutes  Subjective:   Ernest Cook is a 29 y.o. male patient admitted with "I am doing better".  HPI: Patient has been seen and reevaluated.  Case has been reviewed with emergency room staff and TTS.  I have spoken with the patient's mother and with the patient's act team.  29 year old man with schizoaffective disorder who has had several visits to the hospital in the last several months related to what seems to be a worsening of his psychotic disorder.  This time around we had made a decision to hold him in the emergency room because we had the [facility]  The chances of an acceptance there are much higher if the patient is coming from an emergency room then from an inpatient ward.  While in the emergency room the patient has been continued on his antipsychotic and mood stabilizing medicines.  He has been regularly evaluated and his physical needs have been attended to.  Early on patient was more prone to outbursts of anger and agitation but over the last week or more these have declined a great deal.  This week he did have 1 episode of increased anger and agitation specifically over his frustration at being in the emergency room but was redirectable and calm himself down well.  He has been compliant with medication and not shown any side effects or problems.  Patient today reports that he feels like he is doing well.  He denies any  psychotic symptoms.  Denies suicidal or homicidal ideation.  Denies any thought or intention of harming anyone back in his home community.  His mother has been consulted and she after speaking with him nearly daily feels that he is doing very well and would be safe to come home  Past Psychiatric History: Multiple prior admissions.  He has an act team and [facility] and has had extended periods of stability in the past  Risk to Self: Suicidal Ideation: No (Unable to assess) Suicidal Intent: No (Unable to assess) Is patient at risk for suicide?: No Suicidal Plan?: No Access to Means: No What has been your use of drugs/alcohol within the last 12 months?: Unable to assess How many times?: 0 Other Self Harm Risks: Unable to assess Triggers for Past Attempts: Unknown Intentional Self Injurious Behavior: None Risk to Others: Homicidal Ideation: Yes-Currently Present Thoughts of Harm to Others: Yes-Currently Present Comment - Thoughts of Harm to Others: Yes, per ACT Team Current Homicidal Intent: Yes-Currently Present Current Homicidal Plan: Yes-Currently Present Describe Current Homicidal Plan: Patient had knife with plans to stab someone. Access to Homicidal Means: Yes Describe Access to Homicidal Means: Access to weapons Identified Victim: No specific person History of harm to others?: Yes Assessment of Violence: None Noted Violent Behavior Description: Per ACT Team, confrontational and verbally abusive to staff  Does patient have access to weapons?: Yes (Comment) Criminal Charges Pending?: No Does patient have a court date: No Prior Inpatient Therapy: Prior Inpatient Therapy: Yes Prior Therapy Dates: 03/2020 & 03/2016 Prior  Therapy Facilty/Provider(s): [facility] Reason for Treatment: Schizophrenia Prior Outpatient Therapy: Prior Outpatient Therapy: Yes Prior Therapy Dates: Current Prior Therapy [facility] Reason for Treatment: Schizophrenia Does  patient have an ACCT team?: Yes Does patient have Intensive In-House Services?  : No Does patient have [facility] services? : No Does patient have P4CC services?: No  Past Medical History:  Past Medical History:  Diagnosis Date  . Schizo affective schizophrenia [employer](HCC)     Past Surgical History:  Procedure Laterality Date  . ORIF HUMERUS FRACTURE Right 06/27/2020   Procedure: OPEN REDUCTION INTERNAL FIXATION (ORIF) HUMERAL SHAFT FRACTURE;  Surgeon: Roby LoftsHaddix, Kevin P, MD;  Location: [facility] OR;  Service: Orthopedics;  Laterality: Right;  . SKIN GRAFT Left unknown   Pt reports left foot and leg skin graft   Family History:  Family History  Problem Relation Age of Onset  . Diabetes Mother   . Hypertension Mother    Family Psychiatric  History: See previous Social History:  Social History   Substance and Sexual Activity  Alcohol Use No     Social History   Substance and Sexual Activity  Drug Use Yes  . Types: Marijuana   Comment: "every now and again"    Social History   Socioeconomic History  . Marital status: Single    Spouse name: Not on file  . Number of children: Not on file  . Years of education: Not on file  . Highest education level: Not on file  Occupational History  . Not on file  Tobacco Use  . Smoking status: Current Every Day Smoker    Packs/day: 0.25    Types: Cigarettes  . Smokeless tobacco: Never Used  Substance and Sexual Activity  . Alcohol use: No  . Drug use: Yes    Types: Marijuana    Comment: "every now and again"  . Sexual activity: Yes    Birth control/protection: Condom    Comment: Pt reports he is attracted to men  Other Topics Concern  . Not on file  Social History Narrative  . Not on file   Social Determinants of Health   Financial Resource Strain: Not on file  Food Insecurity: Not on file  Transportation Needs: Not on file  Physical Activity: Not on file  Stress: Not on file  Social Connections: Not on file   Additional Social  History:    Allergies:   Allergies  Allergen Reactions  . Penicillins Other (See Comments)    Seizure when he was 19    Labs: No results found for this or any previous visit (from the past 48 hour(s)).  Current Facility-Administered Medications  Medication Dose Route Frequency Provider Last Rate Last Admin  . aspirin EC tablet 81 mg  81 mg Oral Daily Willy Eddyobinson, Patrick, MD   81 mg at 08/01/20 1025  . atomoxetine (STRATTERA) capsule 40 mg  40 mg Oral q morning - 10a Willy Eddyobinson, Patrick, MD   40 mg at 08/01/20 1026  . benztropine (COGENTIN) tablet 1 mg  1 mg Oral QHS Willy Eddyobinson, Patrick, MD   1 mg at 07/31/20 2148  . cholecalciferol (VITAMIN D3) tablet 2,000 Units  2,000 Units Oral Daily Charm RingsLord, Jamison Y, NP   2,000 Units at 08/01/20 1025  . diphenhydrAMINE (BENADRYL) injection 50 mg  50 mg Intramuscular Q6H PRN Fisher Hargadon, Jackquline DenmarkJohn T, MD   50 mg at 07/13/20 0524  . haloperidol lactate (HALDOL) injection 5 mg  5 mg Intramuscular Q6H PRN Letita Prentiss, Jackquline DenmarkJohn T, MD  5 mg at 07/13/20 0525  . HYDROcodone-acetaminophen (NORCO/VICODIN) 5-325 MG per tablet 1 tablet  1 tablet Oral Q8H PRN Willy Eddy, MD      . lithium carbonate (ESKALITH) CR tablet 450 mg  450 mg Oral Q12H Tanazia Achee, Jackquline Denmark, MD   450 mg at 08/01/20 1027  . LORazepam (ATIVAN) tablet 2 mg  2 mg Oral Q6H PRN Wafa Martes, Jackquline Denmark, MD   2 mg at 07/30/20 0934   Or  . LORazepam (ATIVAN) injection 2 mg  2 mg Intramuscular Q6H PRN Antonyo Hinderer, Jackquline Denmark, MD   2 mg at 07/13/20 0525  . LORazepam (ATIVAN) tablet 2 mg  2 mg Oral Once Bassel Gaskill T, MD      . methocarbamol (ROBAXIN) tablet 500 mg  500 mg Oral Q6H PRN Willy Eddy, MD      . risperiDONE (RISPERDAL) 1 MG/ML oral solution 3 mg  3 mg Oral QHS Willy Eddy, MD   3 mg at 07/31/20 2147  . risperiDONE microspheres (RISPERDAL CONSTA) injection 50 mg  50 mg Intramuscular Q14 Days Robinette Esters, Jackquline Denmark, MD   50 mg at 07/25/20 2305   Current Outpatient Medications  Medication Sig Dispense Refill  . aspirin  EC 81 MG EC tablet Take 1 tablet (81 mg total) by mouth daily. Swallow whole. 30 tablet 0  . atomoxetine (STRATTERA) 40 MG capsule Take 40 mg by mouth every morning.     . benztropine (COGENTIN) 1 MG tablet Take 1 mg by mouth at bedtime.     . cholecalciferol (VITAMIN D) 25 MCG tablet Take 2 tablets (2,000 Units total) by mouth daily. 60 tablet 2  . HYDROcodone-acetaminophen (NORCO/VICODIN) 5-325 MG tablet Take 1 tablet by mouth every 8 (eight) hours as needed for severe pain. 10 tablet 0  . lithium carbonate 150 MG capsule Take 3 capsules (450 mg total) by mouth 2 (two) times daily with a meal. 80 capsule 0  . lithium carbonate 300 MG capsule Take 1 capsule (300 mg total) by mouth at bedtime. 14 capsule 0  . methocarbamol (ROBAXIN) 500 MG tablet Take 1 tablet (500 mg total) by mouth every 6 (six) hours as needed for muscle spasms. 20 tablet 0  . risperiDONE (RISPERDAL) 3 MG tablet Take 1 tablet (3 mg total) by mouth at bedtime. 30 tablet 1    Musculoskeletal: Strength & Muscle Tone: within normal limits Gait & Station: normal Patient leans: N/A  Psychiatric Specialty Exam: Physical Exam Vitals and nursing note reviewed.  Constitutional:      Appearance: He is well-developed and well-nourished.  HENT:     Head: Normocephalic and atraumatic.  Eyes:     Conjunctiva/sclera: Conjunctivae normal.     Pupils: Pupils are equal, round, and reactive to light.  Cardiovascular:     Heart sounds: Normal heart sounds.  Pulmonary:     Effort: Pulmonary effort is normal.  Abdominal:     Palpations: Abdomen is soft.  Musculoskeletal:        General: Normal range of motion.     Cervical back: Normal range of motion.  Skin:    General: Skin is warm and dry.  Neurological:     General: No focal deficit present.     Mental Status: He is alert.  Psychiatric:        Attention and Perception: Attention normal.        Mood and Affect: Mood normal.        Speech: Speech normal.  Behavior:  Behavior is cooperative.        Thought Content: Thought content normal.        Cognition and Memory: Cognition normal.        Judgment: Judgment normal.     Review of Systems  Constitutional: Negative.   HENT: Negative.   Eyes: Negative.   Respiratory: Negative.   Cardiovascular: Negative.   Gastrointestinal: Negative.   Musculoskeletal: Negative.   Skin: Negative.   Neurological: Negative.   Psychiatric/Behavioral: Negative.     Blood pressure 117/69, pulse 83, temperature 98.3 F (36.8 C), temperature source Oral, resp. rate 15, height 5\' 11"  (1.803 m), weight 90.7 kg, SpO2 99 %.Body mass index is 27.89 kg/m.  General Appearance: Casual  Eye Contact:  Good  Speech:  Clear and Coherent  Volume:  Normal  Mood:  Euthymic  Affect:  Constricted  Thought Process:  Goal Directed  Orientation:  Full (Time, Place, and Person)  Thought Content:  Logical  Suicidal Thoughts:  No  Homicidal Thoughts:  No  Memory:  Immediate;   Fair Recent;   Fair Remote;   Fair  Judgement:  Fair  Insight:  Fair  Psychomotor Activity:  Normal  Concentration:  Concentration: Fair  Recall:  of Knowledge:  Fair  Language:  Fair  Akathisia:  No  Handed:  Right  AIMS (if indicated):     Assets:  Desire for Improvement  ADL's:  Impaired  Cognition:  Impaired,  Mild  Sleep:        Treatment Plan Summary: Daily contact with patient to assess and evaluate symptoms and progress in treatment, Medication management and Plan He has been clear for the last couple weeks that we are very unlikely to be successful in referring him to the state hospital.  He has not shown any escalation in violence particularly not interpersonal violence.  Meanwhile he is cooperative with treatment.  Patient has been counseled several times about the importance not only of medicine compliance and staying away from marijuana but making every effort to avoid angry violent threatening behavior.  At this point it seems  like it is in the best interest of the patient to discharge him from the emergency room.  There is no longer any acute sign of risk of violence or self injury.  Patient agrees to follow-up with his act team and prescriptions will be sent to his pharmacy.  Family is agreeable.  Act team is aware of the discharge plan.  Disposition: No evidence of imminent risk to self or others at present.   Patient does not meet criteria for psychiatric inpatient admission. Supportive therapy provided about ongoing stressors. Discussed crisis plan, support from social network, calling 911, coming to the Emergency Department, and calling Suicide Hotline.  [employer], MD 08/01/2020 2:12 PM

## 2020-08-01 NOTE — ED Notes (Signed)
Patients grandmother Ms. Jasmine December to pick up patient.

## 2020-08-01 NOTE — ED Notes (Signed)
Red scrubs changed

## 2020-08-01 NOTE — ED Notes (Signed)
Spoke with Ms. Ernest Cook patients mother, reports that his grandmother Ms. Ernest Cook will be here within 1 hour to pick patient up. TTS called and made aware, patient awaiting discharge papers.

## 2020-08-01 NOTE — BH Assessment (Signed)
[phone]), patientremainson theirwait list

## 2020-08-01 NOTE — ED Provider Notes (Signed)
Procedures     ----------------------------------------- 2:14 PM on 08/01/2020 ----------------------------------------- Advised by psychiatry the patient is improved today, psychiatrically stable and back to baseline.  He will be discharged with his grandmother and follow-up with his ACT team.     Sharman Cheek, MD 08/01/20 1415

## 2020-08-01 NOTE — ED Provider Notes (Signed)
Emergency Medicine Observation Re-evaluation Note  Ernest Cook is a 29 y.o. male, seen on rounds today.  Pt initially presented to the ED for complaints of Mental Health Problem Currently, the patient is calm.  Physical Exam  BP 117/69 (BP Location: Right Arm)   Pulse 83   Temp 98.3 F (36.8 C) (Oral)   Resp 15   Ht 5\' 11"  (1.803 m)   Wt 90.7 kg   SpO2 99%   BMI 27.89 kg/m  Physical Exam General: nad Lungs: unlabored breathing Psych: calm, cooperative  ED Course / MDM  EKG:    I have reviewed the labs performed to date as well as medications administered while in observation.  Recent changes in the last 24 hours include none.  Plan  Current plan is for psych disposition / placement. Patient is under full IVC at this time.   , MD 08/01/20 1110

## 2020-08-08 ENCOUNTER — Emergency Department
Admission: EM | Admit: 2020-08-08 | Discharge: 2020-08-21 | Disposition: A | Discharge status: Other Acute Inpatient Hospital | Payer: Medicaid Other | Attending: Emergency Medicine | Admitting: Emergency Medicine

## 2020-08-08 DIAGNOSIS — Z79899 Other long term (current) drug therapy: Secondary | ICD-10-CM | POA: Insufficient documentation

## 2020-08-08 DIAGNOSIS — F250 Schizoaffective disorder, bipolar type: Secondary | ICD-10-CM | POA: Diagnosis not present

## 2020-08-08 DIAGNOSIS — Z7982 Long term (current) use of aspirin: Secondary | ICD-10-CM | POA: Diagnosis not present

## 2020-08-08 DIAGNOSIS — F259 Schizoaffective disorder, unspecified: Secondary | ICD-10-CM | POA: Insufficient documentation

## 2020-08-08 DIAGNOSIS — F17210 Nicotine dependence, cigarettes, uncomplicated: Secondary | ICD-10-CM | POA: Insufficient documentation

## 2020-08-08 DIAGNOSIS — Z20822 Contact with and (suspected) exposure to covid-19: Secondary | ICD-10-CM | POA: Diagnosis not present

## 2020-08-08 DIAGNOSIS — F29 Unspecified psychosis not due to a substance or known physiological condition: Secondary | ICD-10-CM | POA: Diagnosis not present

## 2020-08-08 DIAGNOSIS — F1220 Cannabis dependence, uncomplicated: Secondary | ICD-10-CM | POA: Diagnosis present

## 2020-08-08 DIAGNOSIS — F23 Brief psychotic disorder: Secondary | ICD-10-CM

## 2020-08-08 LAB — COMPREHENSIVE METABOLIC PANEL
ALT: 29 U/L (ref 0–44)
AST: 28 U/L (ref 15–41)
Albumin: 4.5 g/dL (ref 3.5–5.0)
Alkaline Phosphatase: 166 U/L — ABNORMAL HIGH (ref 38–126)
Anion gap: 8 (ref 5–15)
BUN: 16 mg/dL (ref 6–20)
CO2: 29 mmol/L (ref 22–32)
Calcium: 10.3 mg/dL (ref 8.9–10.3)
Chloride: 102 mmol/L (ref 98–111)
Creatinine, Ser: 1.14 mg/dL (ref 0.61–1.24)
GFR, Estimated: >60 mL/min (ref >60)
Glucose, Bld: 107 mg/dL — ABNORMAL HIGH (ref 70–99)
Potassium: 4.5 mmol/L (ref 3.5–5.1)
Sodium: 139 mmol/L (ref 135–145)
Total Bilirubin: 0.7 mg/dL (ref 0.3–1.2)
Total Protein: 8 g/dL (ref 6.5–8.1)

## 2020-08-08 LAB — URINE DRUG SCREEN, QUALITATIVE (ARMC ONLY)
Amphetamines, Ur Screen: NOT DETECTED
Barbiturates, Ur Screen: NOT DETECTED
Benzodiazepine, Ur Scrn: NOT DETECTED
Cannabinoid 50 Ng, Ur ~~LOC~~: NOT DETECTED
Cocaine Metabolite,Ur ~~LOC~~: NOT DETECTED
MDMA (Ecstasy)Ur Screen: NOT DETECTED
Methadone Scn, Ur: NOT DETECTED
Opiate, Ur Screen: NOT DETECTED
Phencyclidine (PCP) Ur S: NOT DETECTED
Tricyclic, Ur Screen: NOT DETECTED

## 2020-08-08 LAB — CBC
HCT: 42.4 % (ref 39.0–52.0)
Hemoglobin: 13.5 g/dL (ref 13.0–17.0)
MCH: 26.8 pg (ref 26.0–34.0)
MCHC: 31.8 g/dL (ref 30.0–36.0)
MCV: 84.1 fL (ref 80.0–100.0)
Platelets: 448 10*3/uL — ABNORMAL HIGH (ref 150–400)
RBC: 5.04 MIL/uL (ref 4.22–5.81)
RDW: 14.9 % (ref 11.5–15.5)
WBC: 13.2 10*3/uL — ABNORMAL HIGH (ref 4.0–10.5)
nRBC: 0 % (ref 0.0–0.2)

## 2020-08-08 LAB — ETHANOL: Alcohol, Ethyl (B): <10 mg/dL (ref <10)

## 2020-08-08 LAB — ACETAMINOPHEN LEVEL: Acetaminophen (Tylenol), Serum: <10 ug/mL — ABNORMAL LOW (ref 10–30)

## 2020-08-08 LAB — SALICYLATE LEVEL: Salicylate Lvl: <7 mg/dL — ABNORMAL LOW (ref 7.0–30.0)

## 2020-08-08 MED ORDER — LORAZEPAM 2 MG PO TABS
2.0000 mg | ORAL_TABLET | Freq: Once | ORAL | Status: AC
  Administered 2020-08-08: 20:00:00 2 mg via ORAL
  Filled 2020-08-08: qty 1

## 2020-08-08 MED ORDER — HALOPERIDOL LACTATE 5 MG/ML IJ SOLN: 10.0000 mg | INTRAMUSCULAR | Status: AC

## 2020-08-08 MED ORDER — HALOPERIDOL 5 MG PO TABS
10.0000 mg | ORAL_TABLET | ORAL | Status: AC
  Administered 2020-08-08: 20:00:00 10 mg via ORAL
  Filled 2020-08-08: qty 2

## 2020-08-08 NOTE — ED Notes (Signed)
Hourly rounding completed at this time, patient currently awake in room. No complaints, stable, and in no acute distress. Q15 minute rounds and monitoring via Rover and Officer to continue. °

## 2020-08-08 NOTE — ED Notes (Signed)
Pt given cup of sprite in seemingly pleasant mood. Pt refusing covid swab two minutes later in aggressive manner, telling this tech to leave his room.

## 2020-08-08 NOTE — ED Provider Notes (Signed)
Emergency Medicine Observation Re-evaluation Note  Ernest Cook is a 29 y.o. male, seen on rounds today.  Pt initially presented to the ED for complaints of Psychiatric Evaluation Currently, the patient repeatedly walking in and out of his room requesting various things but very tangential and non-sensical. Refusing to follow directions to go back into his room and raising his voice.   Physical Exam  BP 131/80   Pulse 95   Temp 98.6 F (37 C) (Oral)   Resp 20   Ht 5\' 9"  (1.753 m)   Wt 90.7 kg   SpO2 99%   BMI 29.53 kg/m  Physical Exam General: resting calmly, not agitated.  Cardiac: RRR  Lungs: no distress, equal chest rise  Psych: disorganized. Tangential, not following directions and shouting obscenities at staff.   ED Course / MDM  EKG:    I have reviewed the labs performed to date as well as medications administered while in observation.  Recent changes in the last 24 hours include IM sedation meds for above noted behavior after patient refused PO.  Plan  Current plan is for pending psych consult. Patient is under full IVC at this time.   , MD 08/09/20 [id number]

## 2020-08-08 NOTE — ED Provider Notes (Signed)
[facility] Emergency Department Provider Note  ____________________________________________  Time seen: Approximately 11:48 PM  I have reviewed the triage vital signs and the nursing notes.   HISTORY  Chief Complaint Psychiatric Evaluation    Level 5 Caveat: Portions of the History and Physical including HPI and review of systems are unable to be completely obtained due to patient being a poor historian   HPI Ernest Cook is a 29 y.o. male with a history of schizoaffective disorder who was sent to the ED from [facility] under IVC due to agitation, paranoia, disorganized thought.  Patient is difficult to orient or converse with at this time.      Past Medical History:  Diagnosis Date  . Schizo affective schizophrenia [facility])      Patient Active Problem List   Diagnosis Date Noted  . Closed displaced comminuted fracture of shaft of right humerus 06/26/2020  . Broken arm, right, closed, initial encounter 06/25/2020  . Noncompliance 03/31/2016  . Cannabis use disorder, moderate, dependence (HCC) 03/26/2016  . Tobacco use disorder 03/26/2016  . Schizoaffective disorder, bipolar type (HCC) 03/25/2016  . Involuntary commitment 03/24/2016     Past Surgical History:  Procedure Laterality Date  . ORIF HUMERUS FRACTURE Right 06/27/2020   Procedure: OPEN REDUCTION INTERNAL FIXATION (ORIF) HUMERAL SHAFT FRACTURE;  Surgeon: Roby Lofts, MD;  Location: [facility] OR;  Service: Orthopedics;  Laterality: Right;  . SKIN GRAFT Left unknown   Pt reports left foot and leg skin graft     Prior to Admission medications   Medication Sig Start Date End Date Taking? Authorizing Provider  aspirin EC 81 MG EC tablet Take 1 tablet (81 mg total) by mouth daily. Swallow whole. 08/02/20  Yes Clapacs, Ernest Denmark, MD  atomoxetine (STRATTERA) 40 MG capsule Take 1 capsule (40 mg total) by mouth every morning. 08/01/20  Yes Clapacs, Ernest Denmark, MD  benztropine (COGENTIN) 1 MG tablet Take 1  tablet (1 mg total) by mouth at bedtime. 08/01/20  Yes Clapacs, Ernest Denmark, MD  haloperidol (HALDOL) 10 MG tablet Take 10 mg by mouth at bedtime. 08/01/20  Yes [provider]  lithium carbonate (ESKALITH) 450 MG CR tablet Take 1 tablet (450 mg total) by mouth every 12 (twelve) hours. 08/01/20  Yes Clapacs, Ernest Denmark, MD  methocarbamol (ROBAXIN) 500 MG tablet Take 1 tablet (500 mg total) by mouth every 6 (six) hours as needed for muscle spasms. 08/01/20  Yes Clapacs, Ernest Denmark, MD  risperiDONE (RISPERDAL) 3 MG tablet Take 1 tablet (3 mg total) by mouth at bedtime. 08/01/20  Yes Clapacs, Ernest Denmark, MD  risperiDONE microspheres (RISPERDAL CONSTA) 50 MG injection Inject 2 mLs (50 mg total) into the muscle every 14 (fourteen) days. 08/08/20  Yes Clapacs, Ernest Denmark, MD  Vitamin D3 (VITAMIN D) 25 MCG tablet Take 2 tablets (2,000 Units total) by mouth daily. 08/01/20 10/30/20 Yes Clapacs, Ernest Denmark, MD     Allergies Penicillins   Family History  Problem Relation Age of Onset  . Diabetes Mother   . Hypertension Mother     Social History Social History   Tobacco Use  . Smoking status: Current Every Day Smoker    Packs/day: 0.25    Types: Cigarettes  . Smokeless tobacco: Never Used  Substance Use Topics  . Alcohol use: No  . Drug use: Yes    Types: Marijuana    Comment: "every now and again"    Review of Systems Level 5 Caveat: Portions of the History  and Physical including HPI and review of systems are unable to be completely obtained due to patient being a poor historian   Constitutional:   No known fever.  ENT:   No rhinorrhea. Cardiovascular:   No chest pain or syncope. Respiratory:   No dyspnea or cough. Gastrointestinal:   Negative for abdominal pain, vomiting and diarrhea.  Musculoskeletal:   Negative for focal pain or swelling ____________________________________________   PHYSICAL EXAM:  VITAL SIGNS: ED Triage Vitals [08/08/20 1917]  Enc Vitals Group     BP 131/80     Pulse  Rate 95     Resp 20     Temp 98.6 F (37 C)     Temp Source Oral     SpO2 99 %     Weight 199 lb 15.3 oz (90.7 kg)     Height 5\' 9"  (1.753 m)     Head Circumference      Peak Flow      Pain Score      Pain Loc      Pain Edu?      Excl. in GC?     Vital signs reviewed, nursing assessments reviewed. Minimally cooperative with exam  Constitutional:   Awake and alert, oriented to self Eyes:   Conjunctivae are normal. EOMI.  ENT      Head:   Normocephalic and atraumatic.      Nose:   No congestion/rhinnorhea.             Neck:   No meningismus. Full ROM.  Cardiovascular:   RRR. Respiratory:   Normal respiratory effort without tachypnea/retractions. Musculoskeletal:   Normal range of motion in all extremities. No joint effusions.  No lower extremity tenderness.  No edema. Neurologic:   Pressured speech, disorganized thought process, agitated, labile mood Motor grossly intact.  Skin:    Skin is warm, dry and intact. No rash noted.  No petechiae, purpura, or bullae.  ____________________________________________    LABS (pertinent positives/negatives) (all labs ordered are listed, but only abnormal results are displayed) Labs Reviewed  COMPREHENSIVE METABOLIC PANEL - Abnormal; Notable for the following components:      Result Value   Glucose, Bld 107 (*)    Alkaline Phosphatase 166 (*)    All other components within normal limits  SALICYLATE LEVEL - Abnormal; Notable for the following components:   Salicylate Lvl <7.0 (*)    All other components within normal limits  ACETAMINOPHEN LEVEL - Abnormal; Notable for the following components:   Acetaminophen (Tylenol), Serum <10 (*)    All other components within normal limits  CBC - Abnormal; Notable for the following components:   WBC 13.2 (*)    Platelets 448 (*)    All other components within normal limits  RESP PANEL BY RT-PCR (FLU A&B, COVID) ARPGX2  ETHANOL  URINE DRUG SCREEN, QUALITATIVE ([facility] ONLY)    ____________________________________________   EKG    ____________________________________________    RADIOLOGY  No results found.  ____________________________________________   PROCEDURES Procedures  ____________________________________________    CLINICAL IMPRESSION / ASSESSMENT AND PLAN / ED COURSE  Medications ordered in the ED: Medications  haloperidol (HALDOL) tablet 10 mg (10 mg Oral Given 08/08/20 1939)    Or  haloperidol lactate (HALDOL) injection 10 mg ( Intramuscular See Alternative 08/08/20 1939)  LORazepam (ATIVAN) tablet 2 mg (2 mg Oral Given 08/08/20 1939)    Pertinent labs & imaging results that were available during my care of the patient were reviewed by me  and considered in my medical decision making (see chart for details).   Ernest Cook was evaluated in Emergency Department on 08/08/2020 for the symptoms described in the history of present illness. He was evaluated in the context of the global COVID-19 pandemic, which necessitated consideration that the patient might be at risk for infection with the SARS-CoV-2 virus that causes COVID-19. Institutional protocols and algorithms that pertain to the evaluation of patients at risk for COVID-19 are in a state of rapid change based on information released by regulatory bodies including the CDC and federal and state organizations. These policies and algorithms were followed during the patient's care in the ED.   Patient presents with acute psychosis, suspected medication noncompliance.  Will maintain IVC, give patient Haldol and Ativan to help calm him and start treating his psychosis.  Will seek psychiatry evaluation tomorrow when he is hopefully less agitated.  The patient has been placed in psychiatric observation due to the need to provide a safe environment for the patient while obtaining psychiatric consultation and evaluation, as well as ongoing medical and medication management to treat the  patient's condition.  The patient has been placed under full IVC at this time.       ____________________________________________   FINAL CLINICAL IMPRESSION(S) / ED DIAGNOSES    Final diagnoses:  Acute psychosis [facility])     ED Discharge Orders    None      Portions of this note were generated with dragon dictation software. Dictation errors may occur despite best attempts at proofreading.   Sharman Cheek, MD 08/08/20 2351

## 2020-08-08 NOTE — BH Assessment (Signed)
Unable to assess pt due to him being medicated. This Clinical research associate will attempt to assess pt in the event he arouses.

## 2020-08-08 NOTE — ED Notes (Signed)
Hourly rounding completed at this time, patient currently asleep in room. No complaints, stable, and in no acute distress. Q15 minute rounds and monitoring via Rover and Officer to continue. 
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
## 2020-08-08 NOTE — ED Notes (Signed)
Attempted to dress pt out into hospital provided clothes. Pt is refusing to change pants at this time. Security aware and Raquel, Consulting civil engineer. Clothing that pt did change into collect and placed in pt belongings bag and labeled. These include:  1 pair or navy shoes 1 navy shirt 1 orange jacket

## 2020-08-09 DIAGNOSIS — F250 Schizoaffective disorder, bipolar type: Secondary | ICD-10-CM

## 2020-08-09 MED ORDER — ASPIRIN EC 81 MG PO TBEC
81.0000 mg | DELAYED_RELEASE_TABLET | Freq: Every day | ORAL | Status: DC
  Administered 2020-08-09 – 2020-08-21 (×11): 81 mg via ORAL
  Filled 2020-08-09 (×11): qty 1

## 2020-08-09 MED ORDER — VITAMIN D 25 MCG (1000 UNIT) PO TABS
2000.0000 [IU] | ORAL_TABLET | Freq: Every day | ORAL | Status: DC
  Administered 2020-08-12 – 2020-08-21 (×10): 2000 [IU] via ORAL
  Filled 2020-08-09 (×11): qty 2

## 2020-08-09 MED ORDER — RISPERIDONE 3 MG PO TABS
3.0000 mg | ORAL_TABLET | Freq: Every day | ORAL | Status: DC
  Administered 2020-08-09 – 2020-08-11 (×3): 3 mg via ORAL
  Filled 2020-08-09 (×3): qty 1

## 2020-08-09 MED ORDER — HALOPERIDOL LACTATE 5 MG/ML IJ SOLN
5.0000 mg | Freq: Once | INTRAMUSCULAR | Status: AC
  Administered 2020-08-09: 06:00:00 5 mg via INTRAMUSCULAR
  Filled 2020-08-09: qty 1

## 2020-08-09 MED ORDER — HALOPERIDOL 5 MG PO TABS
10.0000 mg | ORAL_TABLET | Freq: Every day | ORAL | Status: DC
  Administered 2020-08-09: 20:00:00 10 mg via ORAL
  Filled 2020-08-09: qty 2

## 2020-08-09 MED ORDER — DIPHENHYDRAMINE HCL 50 MG/ML IJ SOLN
50.0000 mg | Freq: Once | INTRAMUSCULAR | Status: AC
  Administered 2020-08-09: 50 mg via INTRAMUSCULAR

## 2020-08-09 MED ORDER — LITHIUM CARBONATE ER 450 MG PO TBCR
450.0000 mg | EXTENDED_RELEASE_TABLET | Freq: Two times a day (BID) | ORAL | Status: DC
  Administered 2020-08-09 – 2020-08-12 (×5): 450 mg via ORAL
  Filled 2020-08-09 (×6): qty 1

## 2020-08-09 MED ORDER — LORAZEPAM 2 MG PO TABS
2.0000 mg | ORAL_TABLET | Freq: Once | ORAL | Status: AC
  Administered 2020-08-11: 09:00:00 2 mg via ORAL
  Filled 2020-08-09 (×2): qty 1

## 2020-08-09 MED ORDER — DIPHENHYDRAMINE HCL 50 MG/ML IJ SOLN
50.0000 mg | Freq: Once | INTRAMUSCULAR | Status: DC
  Filled 2020-08-09: qty 1

## 2020-08-09 MED ORDER — LORAZEPAM 2 MG/ML IJ SOLN
2.0000 mg | Freq: Once | INTRAMUSCULAR | Status: AC
  Administered 2020-08-09: 06:00:00 2 mg via INTRAMUSCULAR
  Filled 2020-08-09: qty 1

## 2020-08-09 MED ORDER — ATOMOXETINE HCL 10 MG PO CAPS
40.0000 mg | ORAL_CAPSULE | Freq: Every day | ORAL | Status: DC
  Administered 2020-08-11 – 2020-08-16 (×6): 40 mg via ORAL
  Filled 2020-08-09 (×8): qty 4

## 2020-08-09 MED ORDER — BENZTROPINE MESYLATE 1 MG PO TABS
1.0000 mg | ORAL_TABLET | Freq: Every day | ORAL | Status: DC
  Administered 2020-08-09 – 2020-08-20 (×12): 1 mg via ORAL
  Filled 2020-08-09 (×12): qty 1

## 2020-08-09 MED ORDER — DIPHENHYDRAMINE HCL 25 MG PO CAPS
50.0000 mg | ORAL_CAPSULE | Freq: Once | ORAL | Status: AC
  Administered 2020-08-09: 23:00:00 50 mg via ORAL

## 2020-08-09 NOTE — ED Notes (Signed)
Pt refused to take injections, security into room to assist with force medication. With security presence pt agreed to take and force medication was not required.

## 2020-08-09 NOTE — ED Notes (Signed)
Pt awake at this time, enters hallway and requests mt dew. Pt informed that there is no mt dew in hospital, pt jerks and states, "get me mt dew." and he then requests sprite. Provided to pt, immediately drinks it all and requests more which is provided. PT standing in hallway at this time

## 2020-08-09 NOTE — ED Notes (Signed)
Patient knocking on window requesting something to eat, lunch box provided, patient with inapropriate sexual gestures and speaking inappropriate sexual remarks. Will continue to monitor. Safety maintained.

## 2020-08-09 NOTE — ED Notes (Signed)
Pt IVC/pending psych consult. 

## 2020-08-09 NOTE — ED Notes (Signed)
Dinner tray provided

## 2020-08-09 NOTE — ED Notes (Signed)
Pt asked to return to room by this nurse. Pt is hard to understand but speaks about going to the store, going home, getting mt. Dew, pt then asks "what're you going to do, push me?" pt reassured that this is not the case. Pt then turns and enters room and requests water, provided by this nurse. Will continue to monitor. Security is present in [place] for safety

## 2020-08-09 NOTE — ED Notes (Signed)
Pt continues to escalate and become more agitated. Pt will not listen to staff requests. Pt raising voice and pointing middle finger toward staff stating "fuck off". MD notified of continued change at this time

## 2020-08-09 NOTE — BH Assessment (Signed)
Patient referred to [facility], pending review.    State Referral Form and supporting documentation completed and faxed to [employer]. Pending  Authorization/Tracking number.   Verbal screening completed with [facility]([phone]), information faxed and confirmed it was received.

## 2020-08-09 NOTE — ED Notes (Signed)
Patient banging on side window regularly requesting to use the phone. Diner tray, snacks and drinks provide. Patient continues to bang on window pace in room talking to himself.

## 2020-08-09 NOTE — ED Notes (Signed)
Patient continuously knocking on window, talking gibberish, unable to comprehend patient unless, frequently requesting something to eat and drink. Safety maintained.

## 2020-08-09 NOTE — ED Notes (Signed)
Patient pacing in unit. Banging on window and door.  Patient appears to be interacting with internal stimuli. Patient talking to walls and using hand gestures.  EDP Derrill Kay made aware of behaviors verbal order for benadryl 50mg  po to be given to help patient relax and rest.

## 2020-08-09 NOTE — ED Notes (Signed)
Hourly rounding completed at this time, patient currently asleep in room. No complaints, stable, and in no acute distress. Q15 minute rounds and monitoring via Rover and Officer to continue. 

## 2020-08-09 NOTE — Discharge Instructions (Signed)
Psychosis Psychosis, also called thought disturbance, refers to a severe loss of contact with reality. People having a psychotic episode are not able to think clearly, and their emotions and responses do not match with what is actually happening. People having a psychotic episode may have false beliefs about what is happening or who they are (delusions). They may see, hear, taste, smell, or feel things that are not present (hallucinations). They may also be very upset (agitated), have chaotic behavior, or be very quiet and withdrawn. What are the causes? This condition may be caused by:  Very serious mental health (psychiatric) conditions such as schizophrenia, bipolar disorder, or major depression.  Use of drugs such as hallucinogens or alcohol.  Medical conditions such as delirium or neurological disorders. What are the signs or symptoms? Symptoms of this condition include:  Delusions, such as: ? Feeling a lot of fear or suspicion (paranoia). ? Believing something that is odd, unrealistic, or false, such as believing that you are someone else.  Hallucinations, such as: ? Hearing or seeing things, smelling odors, experiencing tastes, or feeling bodily sensations. ? Command hallucinations that direct you to do something that could be dangerous.  Disorganized thinking, such as thoughts that jump from one idea to another in a way that does not make sense.  Disorganized speech, such as saying things that do not make sense, echoing others, or using words based on their sound rather than their meaning.  Inappropriate behavior, such as talking to yourself, showing a clear increase or decrease in activity, or intruding on unfamiliar people. How is this diagnosed? This condition is diagnosed based on an assessment by a health care provider.  The health care provider may ask questions about: ? Your thoughts, feelings, and behavior. ? Any medical conditions you have. ? Any use of alcohol or  drugs.  One or more of the following may also be done: ? A physical exam. ? Blood tests. ? Brain imaging, such as a CT scan or MRI. ? A brain wave study (electroencephalogram, or EEG). The health care provider may refer you to a mental health professional for further tests. How is this treated? Treatment for this condition may depend on the cause of the psychosis. Treatment may include one or more of the following:  Supportive care and monitoring in the emergency room or hospital. You may need to stay in the hospital if you are a danger to yourself or others.  Taking antipsychotic medicines to reduce symptoms and to balance chemicals in the brain.  Treating an underlying medical condition.  Stopping or reducing drugs that are causing psychosis.  Therapy and other supportive programs, such as: ? Ongoing treatment and care from a mental health professional. ? Individual or family therapy. ? Training to learn new skills to cope with the psychosis and prevent further episodes. Follow these instructions at home:  Take over-the-counter and prescription medicines only as told by your health care provider.  Consult a health care provider before taking over-the-counter medicines, herbs, or supplements.  Surround yourself with people who care about you and can help manage your condition.  Keep stress under control. Stress may trigger psychosis and make symptoms worse.  Maintain a healthy lifestyle. This includes: ? Eating a healthy diet. ? Getting enough sleep. ? Exercising regularly. ? Avoiding alcohol, nicotine, and recreational drugs.  Keep all follow-up visits as told by your health care provider. This is important. Contact a health care provider if:  Medicines do not seem to be helping.    You or others notice that you: ? Continue to see, smell, or feel things that are not there. ? Hear voices telling you to do things. ? Feel extremely fearful and suspicious that someone or  something will harm you. ? Feel unable to leave your house. ? Have trouble taking care of yourself.  You have side effects of medicines, such as: ? Changes in sleep patterns. ? Dizziness. ? Weight gain. ? Restlessness. ? Movement changes. ? Shaking that you cannot control (tremors). Get help right away if:  You have serious side effects of medicine, such as: ? Swelling of the face, lips, tongue, or throat. ? Fever, confusion, muscle spasms, or seizures.  You have serious thoughts about harming yourself or hurting others. If you ever feel like you may hurt yourself or others, or have thoughts about taking your own life, get help right away. You can go to your nearest emergency department or call:  Your local emergency services (911 in the [place]  A suicide crisis helpline, such as the National Suicide Prevention Lifeline at 1-800-273-8255. This is open 24 hours a day. Summary  Psychosis refers to a severe loss of contact with reality. People having a psychotic episode are not able to think clearly, and they may have delusions or hallucinations.  Psychosis is a serious medical condition that should be treated by a medical professional as soon as possible. Being checked and treated right away can stop or reduce symptoms. This prevents more serious problems from developing.  In some cases, treatment may include taking antipsychotic medicines to reduce symptoms and to balance chemicals in the brain.  Support programs may help you learn new skills to cope with the psychosis and prevent further episodes. This information is not intended to replace advice given to you by your health care provider. Make sure you discuss any questions you have with your health care provider. Document Revised: 10/16/2017 Document Reviewed: 09/15/2017 Elsevier Patient Education  2020 [employer]  

## 2020-08-09 NOTE — ED Notes (Signed)
Pt is asleep at this time.

## 2020-08-09 NOTE — ED Notes (Signed)
Assumed care of patient, sleeping comfortably at present time. Safety maintained.

## 2020-08-09 NOTE — ED Notes (Signed)
IVC, pend placement 

## 2020-08-09 NOTE — ED Notes (Signed)
Patient brought to [facility] to room BHU 6. Report received from [facility]. Patient barking at Clinical research associate and other staff. Patient was directed to stand out of wheelchair and go into room BHU 6. Patient was visually upset and punched padded door and was cursing. Patient appears to have pressured speech. Patient's pants and under garment removed by Thayer Ohm RN with security assisting for safety reasons. Patient was redressed in appropriate underwear and burgundy scrub pants.

## 2020-08-09 NOTE — ED Notes (Signed)
This nurse collects pt vitals. While vitals are being collected, pt speaks about sexual actions with this nurse, this nurse informs pt that there are none of that occurring in ED. Pt is still hard to understand with his speech but can be understood stating, "do that white boy shit." "pull out that dick and we can suck it off." "just going to fuck and get it done" Pt asked to remain in room but then states that he is leaving. Pt informed again that he is here under IVC and will stay here for now. Pt continues to speak to self in hallway at this time.

## 2020-08-09 NOTE — ED Notes (Signed)
Was given  Verbal order to give patient night medications early per EDP Derrill Kay. Patient took medications with no issues with this Clinical research associate and security standing by for safety. Patient continues to state he needs to go home.

## 2020-08-09 NOTE — ED Provider Notes (Signed)
Emergency Medicine Observation Re-evaluation Note  Ernest Cook is a 29 y.o. male, seen on rounds today.  Pt initially presented to the ED for complaints of Psychiatric Evaluation and Schizophrenia (Pt has a hx of schizoaffective disorder. ) Currently, the patient is mildly agitated.  Physical Exam  BP (!) 142/83   Pulse (!) 101   Temp 98.6 F (37 C) (Oral)   Resp 18   Ht 5\' 9"  (1.753 m)   Wt 90.7 kg   SpO2 97%   BMI 29.53 kg/m  Physical Exam General: No acute distress Cardiac: Well-perfused extremities Lungs: No respiratory distress Psych: Appropriate mood and affect  ED Course / MDM  EKG:    I have reviewed the labs performed to date as well as medications administered while in observation.  Recent changes in the last 24 hours include none.  Plan  Current plan is for placement. Patient is under full IVC at this time.   , MD 08/09/20 1434

## 2020-08-09 NOTE — Consult Note (Signed)
[facility] Face-to-Face Psychiatry Consult   Reason for Consult: Consult for patient with schizoaffective disorder Referring Physician:  Vicente Males Patient Identification: Ernest Cook MRN:  [id number] Principal Diagnosis: Schizoaffective disorder, bipolar type (HCC) Diagnosis:  Principal Problem:   Schizoaffective disorder, bipolar type (HCC) Active Problems:   Cannabis use disorder, moderate, dependence (HCC)   Total Time spent with patient: 1 hour  Subjective:   Ernest Cook is a 29 y.o. male patient admitted with "you know all I had was a male".  HPI: Patient seen chart reviewed.  The sentence above is an example of the word salad he is currently producing.  Brought into the emergency room by EMS yesterday with a report that he had been violent and threatening towards family.  Reports also that he had been violent and threatening to EMS staff.  In the ER he was spitting cursing behaving inappropriately and in an agitated manner.  Today when I went to see him he was pacing and when he spoke it was in a disorganized mishmash of things that did not make much sense but he was not physically aggressive or threatening.  He was able to answer a few questions and claims that he has been compliant with medicine.  Claims that he had not used any drugs.  Patient was just discharged from the hospital short time ago within the last week.  Past Psychiatric History: Long history of multiple hospitalizations and emergency room visits.  Apparently had been stable for a long time until earlier this year he decompensated and now has had one hospitalization after another  Risk to Self:   Risk to Others:   Prior Inpatient Therapy:   Prior Outpatient Therapy:    Past Medical History:  Past Medical History:  Diagnosis Date  . Schizo affective schizophrenia [facility])     Past Surgical History:  Procedure Laterality Date  . ORIF HUMERUS FRACTURE Right 06/27/2020   Procedure: OPEN REDUCTION INTERNAL FIXATION  (ORIF) HUMERAL SHAFT FRACTURE;  Surgeon: Roby Lofts, MD;  Location: [facility] OR;  Service: Orthopedics;  Laterality: Right;  . SKIN GRAFT Left unknown   Pt reports left foot and leg skin graft   Family History:  Family History  Problem Relation Age of Onset  . Diabetes Mother   . Hypertension Mother    Family Psychiatric  History: None Social History:  Social History   Substance and Sexual Activity  Alcohol Use No     Social History   Substance and Sexual Activity  Drug Use Yes  . Types: Marijuana   Comment: "every now and again"    Social History   Socioeconomic History  . Marital status: Single    Spouse name: Not on file  . Number of children: Not on file  . Years of education: Not on file  . Highest education level: Not on file  Occupational History  . Not on file  Tobacco Use  . Smoking status: Current Every Day Smoker    Packs/day: 0.25    Types: Cigarettes  . Smokeless tobacco: Never Used  Substance and Sexual Activity  . Alcohol use: No  . Drug use: Yes    Types: Marijuana    Comment: "every now and again"  . Sexual activity: Yes    Birth control/protection: Condom    Comment: Pt reports he is attracted to men  Other Topics Concern  . Not on file  Social History Narrative  . Not on file   Social Determinants of Health  Financial Resource Strain: Not on file  Food Insecurity: Not on file  Transportation Needs: Not on file  Physical Activity: Not on file  Stress: Not on file  Social Connections: Not on file   Additional Social History:    Allergies:   Allergies  Allergen Reactions  . Penicillins Other (See Comments)    Seizure when he was 19    Labs:  Results for orders placed or performed during the hospital encounter of 08/08/20 (from the past 48 hour(s))  Comprehensive metabolic panel     Status: Abnormal   Collection Time: 08/08/20  7:34 PM  Result Value Ref Range   Sodium 139 135 - 145 mmol/L   Potassium 4.5 3.5 - 5.1 mmol/L    Chloride 102 98 - 111 mmol/L   CO2 29 22 - 32 mmol/L   Glucose, Bld 107 (H) 70 - 99 mg/dL    Comment: Glucose reference range applies only to samples taken after fasting for at least 8 hours.   BUN 16 6 - 20 mg/dL   Creatinine, Ser 3.66 0.61 - 1.24 mg/dL   Calcium 44.0 8.9 - 34.7 mg/dL   Total Protein 8.0 6.5 - 8.1 g/dL   Albumin 4.5 3.5 - 5.0 g/dL   AST 28 15 - 41 U/L   ALT 29 0 - 44 U/L   Alkaline Phosphatase 166 (H) 38 - 126 U/L   Total Bilirubin 0.7 0.3 - 1.2 mg/dL   GFR, Estimated >42 >59 mL/min    Comment: (NOTE) Calculated using the CKD-EPI Creatinine Equation (2021)    Anion gap 8 5 - 15    Comment: Performed at [facility], [address] [place], [place] [address]  Ethanol     Status: None   Collection Time: 08/08/20  7:34 PM  Result Value Ref Range   Alcohol, Ethyl (B) <10 <10 mg/dL    Comment: (NOTE) Lowest detectable limit for serum alcohol is 10 mg/dL.  For medical purposes only. Performed at [facility], [address] [place], [place] [address]   Salicylate level     Status: Abnormal   Collection Time: 08/08/20  7:34 PM  Result Value Ref Range   Salicylate Lvl <7.0 (L) 7.0 - 30.0 mg/dL    Comment: Performed at [facility], [address] [place], [place] [address]  Acetaminophen level     Status: Abnormal   Collection Time: 08/08/20  7:34 PM  Result Value Ref Range   Acetaminophen (Tylenol), Serum <10 (L) 10 - 30 ug/mL    Comment: (NOTE) Therapeutic concentrations vary significantly. A range of 10-30 ug/mL  may be an effective concentration for many patients. However, some  are best treated at concentrations outside of this range. Acetaminophen concentrations >150 ug/mL at 4 hours after ingestion  and >50 ug/mL at 12 hours after ingestion are often associated with  toxic reactions.  Performed at [facility], [address] [place], [place] [address]   cbc     Status: Abnormal   Collection Time: 08/08/20   7:34 PM  Result Value Ref Range   WBC 13.2 (H) 4.0 - 10.5 K/uL   RBC 5.04 4.22 - 5.81 MIL/uL   Hemoglobin 13.5 13.0 - 17.0 g/dL   HCT 06.3 01.6 - 01.0 %   MCV 84.1 80.0 - 100.0 fL   MCH 26.8 26.0 - 34.0 pg   MCHC 31.8 30.0 - 36.0 g/dL   RDW 93.2 35.5 - 73.2 %   Platelets 448 (H)  150 - 400 K/uL   nRBC 0.0 0.0 - 0.2 %    Comment: Performed at [facility], [address] [place], [place] [address]  Urine Drug Screen, Qualitative     Status: None   Collection Time: 08/08/20  7:34 PM  Result Value Ref Range   Tricyclic, Ur Screen NONE DETECTED NONE DETECTED   Amphetamines, Ur Screen NONE DETECTED NONE DETECTED   MDMA (Ecstasy)Ur Screen NONE DETECTED NONE DETECTED   Cocaine Metabolite,Ur [place] NONE DETECTED NONE DETECTED   Opiate, Ur Screen NONE DETECTED NONE DETECTED   Phencyclidine (PCP) Ur S NONE DETECTED NONE DETECTED   Cannabinoid 50 Ng, Ur [place] NONE DETECTED NONE DETECTED   Barbiturates, Ur Screen NONE DETECTED NONE DETECTED   Benzodiazepine, Ur Scrn NONE DETECTED NONE DETECTED   Methadone Scn, Ur NONE DETECTED NONE DETECTED    Comment: (NOTE) Tricyclics + metabolites, urine    Cutoff 1000 ng/mL Amphetamines + metabolites, urine  Cutoff 1000 ng/mL MDMA (Ecstasy), urine              Cutoff 500 ng/mL Cocaine Metabolite, urine          Cutoff 300 ng/mL Opiate + metabolites, urine        Cutoff 300 ng/mL Phencyclidine (PCP), urine         Cutoff 25 ng/mL Cannabinoid, urine                 Cutoff 50 ng/mL Barbiturates + metabolites, urine  Cutoff 200 ng/mL Benzodiazepine, urine              Cutoff 200 ng/mL Methadone, urine                   Cutoff 300 ng/mL  The urine drug screen provides only a preliminary, unconfirmed analytical test result and should not be used for non-medical purposes. Clinical consideration and professional judgment should be applied to any positive drug screen result due to possible interfering substances. A more specific alternate chemical  method must be used in order to obtain a confirmed analytical result. Gas chromatography / mass spectrometry (GC/MS) is the preferred confirm atory method. Performed at [facility], [address], [place], [place] [address]     Current Facility-Administered Medications  Medication Dose Route Frequency Provider Last Rate Last Admin  . LORazepam (ATIVAN) tablet 2 mg  2 mg Oral Once Gilles Chiquito, MD       Current Outpatient Medications  Medication Sig Dispense Refill  . aspirin EC 81 MG EC tablet Take 1 tablet (81 mg total) by mouth daily. Swallow whole. 30 tablet 1  . atomoxetine (STRATTERA) 40 MG capsule Take 1 capsule (40 mg total) by mouth every morning. 30 capsule 1  . benztropine (COGENTIN) 1 MG tablet Take 1 tablet (1 mg total) by mouth at bedtime. 30 tablet 1  . haloperidol (HALDOL) 10 MG tablet Take 10 mg by mouth at bedtime.    Marland Kitchen lithium carbonate (ESKALITH) 450 MG CR tablet Take 1 tablet (450 mg total) by mouth every 12 (twelve) hours. 60 tablet 1  . methocarbamol (ROBAXIN) 500 MG tablet Take 1 tablet (500 mg total) by mouth every 6 (six) hours as needed for muscle spasms. 60 tablet 1  . risperiDONE (RISPERDAL) 3 MG tablet Take 1 tablet (3 mg total) by mouth at bedtime. 30 tablet 1  . risperiDONE microspheres (RISPERDAL CONSTA) 50 MG injection Inject 2 mLs (50 mg total) into the muscle every 14 (fourteen) days.  1 each 1  . Vitamin D3 (VITAMIN D) 25 MCG tablet Take 2 tablets (2,000 Units total) by mouth daily. 60 tablet 1    Musculoskeletal: Strength & Muscle Tone: within normal limits Gait & Station: normal Patient leans: N/A  Psychiatric Specialty Exam: Physical Exam Vitals and nursing note reviewed.  Constitutional:      Appearance: He is well-developed and well-nourished.  HENT:     Head: Normocephalic and atraumatic.  Eyes:     Conjunctiva/sclera: Conjunctivae normal.     Pupils: Pupils are equal, round, and reactive to light.  Cardiovascular:      Heart sounds: Normal heart sounds.  Pulmonary:     Effort: Pulmonary effort is normal.  Abdominal:     Palpations: Abdomen is soft.  Musculoskeletal:        General: Normal range of motion.     Cervical back: Normal range of motion.  Skin:    General: Skin is warm and dry.  Neurological:     General: No focal deficit present.     Mental Status: He is alert.  Psychiatric:        Attention and Perception: He is inattentive.        Mood and Affect: Affect is labile.        Speech: Speech is rapid and pressured and tangential.        Behavior: Behavior is agitated. Behavior is not aggressive.        Thought Content: Thought content is paranoid and delusional.        Cognition and Memory: Cognition is impaired. Memory is impaired.        Judgment: Judgment is impulsive.     Review of Systems  Constitutional: Negative.   HENT: Negative.   Eyes: Negative.   Respiratory: Negative.   Cardiovascular: Negative.   Gastrointestinal: Negative.   Musculoskeletal: Negative.   Skin: Negative.   Neurological: Negative.   Psychiatric/Behavioral: Positive for behavioral problems. The patient is hyperactive.     Blood pressure 132/76, pulse 76, temperature 98.6 F (37 C), temperature source Oral, resp. rate 14, height 5\' 9"  (1.753 m), weight 90.7 kg, SpO2 97 %.Body mass index is 29.53 kg/m.  General Appearance: Casual  Eye Contact:  Good  Speech:  Pressured  Volume:  Increased  Mood:  Euphoric and Irritable  Affect:  Inappropriate and Labile  Thought Process:  Disorganized  Orientation:  Full (Time, Place, and Person)  Thought Content:  Illogical, Delusions, Paranoid Ideation, Rumination and Tangential  Suicidal Thoughts:  No  Homicidal Thoughts:  No  Memory:  Immediate;   Fair Recent;   Poor Remote;   Poor  Judgement:  Impaired  Insight:  Lacking  Psychomotor Activity:  Increased and Restlessness  Concentration:  Concentration: Poor  Recall:  Poor  Fund of Knowledge:  Fair   Language:  Fair  Akathisia:  No  Handed:  Right  AIMS (if indicated):     Assets:  Desire for Improvement Physical Health Resilience  ADL's:  Impaired  Cognition:  Impaired,  Mild  Sleep:        Treatment Plan Summary: Daily contact with patient to assess and evaluate symptoms and progress in treatment, Medication management and Plan After only a few days out of the hospital he returns to once again manic and psychotic.  At this point we do not know whether he was noncompliant with medicine or if it was just from cannabis use or if he was not sleeping but in any  case he has gone right back to the worst of his violent mania.  This despite all of our efforts to get him medication managed overall along recent hospitalization.  Also in spite of his act team's devotion.  [facility].  I will get him back on his medicine here but we are going to work on referral to longer term hospitalization  Disposition: Recommend psychiatric Inpatient admission when medically cleared.  Mordecai RasmussenJohn Makynna Manocchio, MD 08/09/2020 3:39 PM

## 2020-08-09 NOTE — ED Notes (Signed)
Patient continues to bang on window when ignored he begins to pace in the rooms speaking to himself.

## 2020-08-09 NOTE — ED Notes (Signed)
Pt asleep at this time, unable to collect vitals. Will collect pt vitals once awake. 

## 2020-08-09 NOTE — ED Notes (Addendum)
Patient knocking on window requesting something to drink, patient making inappropriate sexual remarks to nurse and licking his lips then laughing. Patient thoughts redirected to his request for a drink. patient turned away and went back into bed.

## 2020-08-09 NOTE — ED Notes (Addendum)
Hourly rounding completed at this time, patient currently awake in hallway. No complaints, stable, and in no acute distress. Q15 minute rounds and monitoring via Rover and Officer to continue. 

## 2020-08-09 NOTE — ED Notes (Signed)
Patient knocking on window and stopping staff walking by door saying, "I need to go home." This writer advised patient he is unable to leave at this time. Patient is pacing in small locked unit.

## 2020-08-09 NOTE — ED Notes (Signed)
Report to Selena Batten, RN, pt to move to [facility]

## 2020-08-09 NOTE — ED Notes (Signed)
lunch provided.  

## 2020-08-09 NOTE — BH Assessment (Signed)
   Referral check:   [facility] ([phone]) Pt currently under review.
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
## 2020-08-10 DIAGNOSIS — F250 Schizoaffective disorder, bipolar type: Secondary | ICD-10-CM | POA: Diagnosis not present

## 2020-08-10 MED ORDER — LORAZEPAM 2 MG PO TABS
2.0000 mg | ORAL_TABLET | Freq: Once | ORAL | Status: AC
  Administered 2020-08-10: 20:00:00 2 mg via ORAL
  Filled 2020-08-10 (×2): qty 1

## 2020-08-10 MED ORDER — HALOPERIDOL 5 MG PO TABS
10.0000 mg | ORAL_TABLET | Freq: Two times a day (BID) | ORAL | Status: DC
  Administered 2020-08-10 – 2020-08-12 (×4): 10 mg via ORAL
  Filled 2020-08-10 (×5): qty 2

## 2020-08-10 MED ORDER — ZIPRASIDONE MESYLATE 20 MG IM SOLR
20.0000 mg | Freq: Once | INTRAMUSCULAR | Status: AC
  Administered 2020-08-10: 03:00:00 20 mg via INTRAMUSCULAR

## 2020-08-10 NOTE — ED Notes (Signed)
Spoke with ED provider Dr. Larinda Buttery about patient not taking medications on first shift and if this writer could try and give 2200 medication now along with one time dose of ativan 2mg  po that was ordered earlier in day that patient refused.  This writer was able to get patient to cooperate and take all medications po with no issues.

## 2020-08-10 NOTE — ED Notes (Signed)
Pt would not take PO medication.  Pt is fixated on using the phone and is unable to carry on a conversation.

## 2020-08-10 NOTE — ED Notes (Signed)
Patient currently in the bed laying down. Will continue to monitor.

## 2020-08-10 NOTE — ED Notes (Signed)
Pt can be heard speaking loudly in nurse's station.  Pt appears to be responding to internal stimuli.

## 2020-08-10 NOTE — ED Notes (Signed)
Snack tray, chocolate milk provided

## 2020-08-10 NOTE — ED Notes (Signed)
Patient banging on window and door patient redirected by ED tech.

## 2020-08-10 NOTE — ED Notes (Signed)
Pt continues to beat on the window and door.  When asked what his needs are he rambles words that do not make any sense.

## 2020-08-10 NOTE — ED Notes (Addendum)
Pt with an outburst of yelling and banging on the [facility] door.  Pt was able to be re-directed.

## 2020-08-10 NOTE — ED Notes (Signed)
Patient refusing vitals at this time.

## 2020-08-10 NOTE — ED Notes (Signed)
Pt banging on window in BHU 7.  Security entered unit to tell patient that was not acceptable.  Pt hypersexual, making inappropriate comments to security officers.

## 2020-08-10 NOTE — ED Notes (Addendum)
Pt yelling through [facility] door at another patient and banging on door. Pt still refusing to take PO medications.

## 2020-08-10 NOTE — ED Notes (Signed)
Patient continues to knock on window and door and being disruptive. Patient redirected by Clinical research associate. Writer gave patient some juice and asked patient to try and get some more rest and made patient aware of time.

## 2020-08-10 NOTE — ED Provider Notes (Signed)
Emergency Medicine Observation Re-evaluation Note  Ernest Cook is a 29 y.o. male, seen on rounds today.  Pt initially presented to the ED for complaints of Psychiatric Evaluation and Schizophrenia (Pt has a hx of schizoaffective disorder. ) Currently, the patient is alert, pacing about, occasionally banging on windows in the BHU.  Physical Exam  BP 132/76 (BP Location: Right Arm)   Pulse 76   Temp 98.6 F (37 C) (Oral)   Resp 14   Ht 5\' 9"  (1.753 m)   Wt 90.7 kg   SpO2 97%   BMI 29.53 kg/m  Physical Exam General: Fully alert, pacing back-and-forth Cardiac: Appears well perfused Lungs: Does not appear to have respiratory distress Psych: Appears elevated mood, somewhat agitated, pacing about occasionally banging on the window with hand  ED Course / MDM  EKG:    I have reviewed the labs performed to date as well as medications administered while in observation.  Recent changes in the last 24 hours include he required intramuscular medications for calming and sedation last night.  Plan  Current plan is for maintain IVC and I have notified Dr. of event last night and KC may be able to further adjust medications. Patient is under full IVC at this time.   Toni Amend, MD 08/10/20 [phone]

## 2020-08-10 NOTE — ED Notes (Signed)
Pt knocking on window of BHU 7.

## 2020-08-10 NOTE — ED Provider Notes (Signed)
----------------------------------------- °  6:30 AM on 08/10/2020 -----------------------------------------  Patient received 20mg  IM Geodon for agitation after verbal redirection attempted.  Received with good effect.  Currently calm.   , MD 08/10/20 0630

## 2020-08-10 NOTE — ED Notes (Signed)
Patient beating on door. Demanding to go home. Patient states, "my family take care of me, I need to go home and see the babies."  When this writer redirected him on beating on door patient started hitting door harder.

## 2020-08-10 NOTE — ED Notes (Signed)
IVC/  PENDING  PLACEMENT 

## 2020-08-10 NOTE — ED Notes (Signed)
Pt refusing VS.  Pt wanting to call his mom so he can go home.

## 2020-08-10 NOTE — ED Notes (Signed)
Patient received IM medication in left deltoid by this Clinical research associate. Security was with Clinical research associate for safety. Patient complied with medication admin with no issues.

## 2020-08-10 NOTE — ED Notes (Signed)
RN unable to get VS due to pt agitation.

## 2020-08-10 NOTE — ED Notes (Signed)
Pt has paced all day, knocking on window or door every few minutes.

## 2020-08-10 NOTE — ED Notes (Addendum)
Pt is extremely disorganized with word salad and garbled speech.  Unable to carry on any type of conversation.  Will not take any medications by mouth.

## 2020-08-10 NOTE — Consult Note (Signed)
[facility] Face-to-Face Psychiatry Consult   Reason for Consult: Consult for 29 year old man with a history of schizoaffective disorder.  Follow-up in the emergency room Referring Physician: Quale Patient Identification: Ernest Cook MRN:  [id number] Principal Diagnosis: Schizoaffective disorder, bipolar type (HCC) Diagnosis:  Principal Problem:   Schizoaffective disorder, bipolar type (HCC) Active Problems:   Cannabis use disorder, moderate, dependence (HCC)   Total Time spent with patient: 30 minutes  Subjective:   Ernest Cook is a 29 y.o. male patient admitted with "I am ready to go home".  HPI: Patient seen chart reviewed.  29 year old man well-known to our service came back to the hospital after a short period out in the community.  Once again displaying manic symptoms.  Today continues to be hyperactive aggressive kicking on walls banging on windows in order to get attention.  Pressured speech inappropriate affect agitation and anger.  Only intermittent cooperation with medicine  Past Psychiatric History: Long history of schizoaffective disorder as noted previously  Risk to Self:   Risk to Others:   Prior Inpatient Therapy:   Prior Outpatient Therapy:    Past Medical History:  Past Medical History:  Diagnosis Date  . Schizo affective schizophrenia [facility])     Past Surgical History:  Procedure Laterality Date  . ORIF HUMERUS FRACTURE Right 06/27/2020   Procedure: OPEN REDUCTION INTERNAL FIXATION (ORIF) HUMERAL SHAFT FRACTURE;  Surgeon: Roby Lofts, MD;  Location: [facility] OR;  Service: Orthopedics;  Laterality: Right;  . SKIN GRAFT Left unknown   Pt reports left foot and leg skin graft   Family History:  Family History  Problem Relation Age of Onset  . Diabetes Mother   . Hypertension Mother    Family Psychiatric  History: See previous Social History:  Social History   Substance and Sexual Activity  Alcohol Use No     Social History   Substance and Sexual Activity   Drug Use Yes  . Types: Marijuana   Comment: "every now and again"    Social History   Socioeconomic History  . Marital status: Single    Spouse name: Not on file  . Number of children: Not on file  . Years of education: Not on file  . Highest education level: Not on file  Occupational History  . Not on file  Tobacco Use  . Smoking status: Current Every Day Smoker    Packs/day: 0.25    Types: Cigarettes  . Smokeless tobacco: Never Used  Substance and Sexual Activity  . Alcohol use: No  . Drug use: Yes    Types: Marijuana    Comment: "every now and again"  . Sexual activity: Yes    Birth control/protection: Condom    Comment: Pt reports he is attracted to men  Other Topics Concern  . Not on file  Social History Narrative  . Not on file   Social Determinants of Health   Financial Resource Strain: Not on file  Food Insecurity: Not on file  Transportation Needs: Not on file  Physical Activity: Not on file  Stress: Not on file  Social Connections: Not on file   Additional Social History:    Allergies:   Allergies  Allergen Reactions  . Penicillins Other (See Comments)    Seizure when he was 19    Labs:  Results for orders placed or performed during the hospital encounter of 08/08/20 (from the past 48 hour(s))  Comprehensive metabolic panel     Status: Abnormal   Collection Time:  08/08/20  7:34 PM  Result Value Ref Range   Sodium 139 135 - 145 mmol/L   Potassium 4.5 3.5 - 5.1 mmol/L   Chloride 102 98 - 111 mmol/L   CO2 29 22 - 32 mmol/L   Glucose, Bld 107 (H) 70 - 99 mg/dL    Comment: Glucose reference range applies only to samples taken after fasting for at least 8 hours.   BUN 16 6 - 20 mg/dL   Creatinine, Ser 5.64 0.61 - 1.24 mg/dL   Calcium 33.2 8.9 - 95.1 mg/dL   Total Protein 8.0 6.5 - 8.1 g/dL   Albumin 4.5 3.5 - 5.0 g/dL   AST 28 15 - 41 U/L   ALT 29 0 - 44 U/L   Alkaline Phosphatase 166 (H) 38 - 126 U/L   Total Bilirubin 0.7 0.3 - 1.2 mg/dL    GFR, Estimated >88 >41 mL/min    Comment: (NOTE) Calculated using the CKD-EPI Creatinine Equation (2021)    Anion gap 8 5 - 15    Comment: Performed at [facility], [address] [place], [place] [address]  Ethanol     Status: None   Collection Time: 08/08/20  7:34 PM  Result Value Ref Range   Alcohol, Ethyl (B) <10 <10 mg/dL    Comment: (NOTE) Lowest detectable limit for serum alcohol is 10 mg/dL.  For medical purposes only. Performed at [facility], [address] [place], [place] [address]   Salicylate level     Status: Abnormal   Collection Time: 08/08/20  7:34 PM  Result Value Ref Range   Salicylate Lvl <7.0 (L) 7.0 - 30.0 mg/dL    Comment: Performed at [facility], [address] [place], [place] [address]  Acetaminophen level     Status: Abnormal   Collection Time: 08/08/20  7:34 PM  Result Value Ref Range   Acetaminophen (Tylenol), Serum <10 (L) 10 - 30 ug/mL    Comment: (NOTE) Therapeutic concentrations vary significantly. A range of 10-30 ug/mL  may be an effective concentration for many patients. However, some  are best treated at concentrations outside of this range. Acetaminophen concentrations >150 ug/mL at 4 hours after ingestion  and >50 ug/mL at 12 hours after ingestion are often associated with  toxic reactions.  Performed at [facility], [address] [place], [place] [address]   cbc     Status: Abnormal   Collection Time: 08/08/20  7:34 PM  Result Value Ref Range   WBC 13.2 (H) 4.0 - 10.5 K/uL   RBC 5.04 4.22 - 5.81 MIL/uL   Hemoglobin 13.5 13.0 - 17.0 g/dL   HCT 20.2 54.2 - 70.6 %   MCV 84.1 80.0 - 100.0 fL   MCH 26.8 26.0 - 34.0 pg   MCHC 31.8 30.0 - 36.0 g/dL   RDW 23.7 62.8 - 31.5 %   Platelets 448 (H) 150 - 400 K/uL   nRBC 0.0 0.0 - 0.2 %    Comment: Performed at [facility], [address] [place], [place] [address]  Urine Drug Screen, Qualitative     Status: None   Collection  Time: 08/08/20  7:34 PM  Result Value Ref Range   Tricyclic, Ur Screen NONE DETECTED NONE DETECTED   Amphetamines, Ur Screen NONE DETECTED NONE DETECTED   MDMA (Ecstasy)Ur Screen NONE DETECTED NONE DETECTED   Cocaine Metabolite,Ur [place] NONE DETECTED NONE DETECTED   Opiate, Ur Screen NONE DETECTED NONE DETECTED   Phencyclidine (  PCP) Ur S NONE DETECTED NONE DETECTED   Cannabinoid 50 Ng, Ur [place] NONE DETECTED NONE DETECTED   Barbiturates, Ur Screen NONE DETECTED NONE DETECTED   Benzodiazepine, Ur Scrn NONE DETECTED NONE DETECTED   Methadone Scn, Ur NONE DETECTED NONE DETECTED    Comment: (NOTE) Tricyclics + metabolites, urine    Cutoff 1000 ng/mL Amphetamines + metabolites, urine  Cutoff 1000 ng/mL MDMA (Ecstasy), urine              Cutoff 500 ng/mL Cocaine Metabolite, urine          Cutoff 300 ng/mL Opiate + metabolites, urine        Cutoff 300 ng/mL Phencyclidine (PCP), urine         Cutoff 25 ng/mL Cannabinoid, urine                 Cutoff 50 ng/mL Barbiturates + metabolites, urine  Cutoff 200 ng/mL Benzodiazepine, urine              Cutoff 200 ng/mL Methadone, urine                   Cutoff 300 ng/mL  The urine drug screen provides only a preliminary, unconfirmed analytical test result and should not be used for non-medical purposes. Clinical consideration and professional judgment should be applied to any positive drug screen result due to possible interfering substances. A more specific alternate chemical method must be used in order to obtain a confirmed analytical result. Gas chromatography / mass spectrometry (GC/MS) is the preferred confirm atory method. Performed at [facility], [address] [place], [place] [address]     Current Facility-Administered Medications  Medication Dose Route Frequency Provider Last Rate Last Admin  . aspirin EC tablet 81 mg  81 mg Oral Daily Greogry Goodwyn, Jackquline DenmarkJohn T, MD   81 mg at 08/09/20 1622  . atomoxetine (STRATTERA) capsule 40 mg  40  mg Oral Daily Reylynn Vanalstine T, MD      . benztropine (COGENTIN) tablet 1 mg  1 mg Oral QHS Luda Charbonneau, Jackquline DenmarkJohn T, MD   1 mg at 08/09/20 1948  . cholecalciferol (VITAMIN D3) tablet 2,000 Units  2,000 Units Oral Daily Everlynn Sagun T, MD      . haloperidol (HALDOL) tablet 10 mg  10 mg Oral BID Deniese Oberry T, MD      . lithium carbonate (ESKALITH) CR tablet 450 mg  450 mg Oral Q12H Dawnielle Christiana, Jackquline DenmarkJohn T, MD   450 mg at 08/09/20 1948  . LORazepam (ATIVAN) tablet 2 mg  2 mg Oral Once Gilles ChiquitoSmith, Zachary P, MD      . LORazepam (ATIVAN) tablet 2 mg  2 mg Oral Once Sharyn CreamerQuale, Mark, MD      . risperiDONE (RISPERDAL) tablet 3 mg  3 mg Oral QHS Kailand Seda, Jackquline DenmarkJohn T, MD   3 mg at 08/09/20 1946   Current Outpatient Medications  Medication Sig Dispense Refill  . aspirin EC 81 MG EC tablet Take 1 tablet (81 mg total) by mouth daily. Swallow whole. 30 tablet 1  . atomoxetine (STRATTERA) 40 MG capsule Take 1 capsule (40 mg total) by mouth every morning. 30 capsule 1  . benztropine (COGENTIN) 1 MG tablet Take 1 tablet (1 mg total) by mouth at bedtime. 30 tablet 1  . haloperidol (HALDOL) 10 MG tablet Take 10 mg by mouth at bedtime.    Marland Kitchen. lithium carbonate (ESKALITH) 450 MG CR tablet Take 1 tablet (450 mg total) by mouth every 12 (  twelve) hours. 60 tablet 1  . methocarbamol (ROBAXIN) 500 MG tablet Take 1 tablet (500 mg total) by mouth every 6 (six) hours as needed for muscle spasms. 60 tablet 1  . risperiDONE (RISPERDAL) 3 MG tablet Take 1 tablet (3 mg total) by mouth at bedtime. 30 tablet 1  . risperiDONE microspheres (RISPERDAL CONSTA) 50 MG injection Inject 2 mLs (50 mg total) into the muscle every 14 (fourteen) days. 1 each 1  . Vitamin D3 (VITAMIN D) 25 MCG tablet Take 2 tablets (2,000 Units total) by mouth daily. 60 tablet 1    Musculoskeletal: Strength & Muscle Tone: within normal limits Gait & Station: normal Patient leans: N/A  Psychiatric Specialty Exam: Physical Exam Vitals and nursing note reviewed.  Constitutional:       Appearance: He is well-developed and well-nourished.  HENT:     Head: Normocephalic and atraumatic.  Eyes:     Conjunctiva/sclera: Conjunctivae normal.     Pupils: Pupils are equal, round, and reactive to light.  Cardiovascular:     Heart sounds: Normal heart sounds.  Pulmonary:     Effort: Pulmonary effort is normal.  Abdominal:     Palpations: Abdomen is soft.  Musculoskeletal:        General: Normal range of motion.     Cervical back: Normal range of motion.  Skin:    General: Skin is warm and dry.  Neurological:     General: No focal deficit present.     Mental Status: He is alert.  Psychiatric:        Attention and Perception: He is inattentive.        Mood and Affect: Affect is labile, angry and inappropriate.        Speech: Speech is tangential.        Behavior: Behavior is agitated and aggressive.        Thought Content: Thought content is paranoid.        Cognition and Memory: Cognition is impaired.        Judgment: Judgment is impulsive and inappropriate.     Review of Systems  Constitutional: Negative.   HENT: Negative.   Eyes: Negative.   Respiratory: Negative.   Cardiovascular: Negative.   Gastrointestinal: Negative.   Musculoskeletal: Negative.   Skin: Negative.   Neurological: Negative.   Psychiatric/Behavioral: Positive for dysphoric mood.    Blood pressure 132/76, pulse 76, temperature 98.6 F (37 C), temperature source Oral, resp. rate 14, height 5\' 9"  (1.753 m), weight 90.7 kg, SpO2 97 %.Body mass index is 29.53 kg/m.  General Appearance: Disheveled  Eye Contact:  Fair  Speech:  Pressured  Volume:  Increased  Mood:  Angry  Affect:  Inappropriate  Thought Process:  Disorganized  Orientation:  Full (Time, Place, and Person)  Thought Content:  Illogical  Suicidal Thoughts:  No  Homicidal Thoughts:  No  Memory:  Immediate;   Fair Recent;   Poor Remote;   Poor  Judgement:  Impaired  Insight:  Shallow  Psychomotor Activity:  Decreased   Concentration:  Concentration: Poor  Recall:  Poor  Fund of Knowledge:  Poor  Language:  Poor  Akathisia:  No  Handed:  Right  AIMS (if indicated):     Assets:  Resilience  ADL's:  Impaired  Cognition:  Impaired,  Mild  Sleep:        Treatment Plan Summary: Daily contact with patient to assess and evaluate symptoms and progress in treatment, Medication management and Plan Patient is once  again displaying full on manic behaviors with agitation and hostility paranoia and aggression.  Decompensated quickly after most recent discharge.  Once again we are referring him to [facility] for longer-term treatment.  I am increasing his Haldol dose to twice a day.  Continue lithium at current dose.  As needed medicines are already ordered.  Disposition: Recommend psychiatric Inpatient admission when medically cleared.  Mordecai Rasmussen, MD 08/10/2020 11:15 AM

## 2020-08-10 NOTE — ED Notes (Signed)
Patient awake and beating on window and door. Patient redirected by this Clinical research associate.

## 2020-08-10 NOTE — ED Notes (Signed)
Pt unwilling to take PO medications at this time.  Pt is pacing and talking to himself continuously.

## 2020-08-11 MED ORDER — LORAZEPAM 2 MG/ML IJ SOLN
2.0000 mg | INTRAMUSCULAR | Status: DC | PRN
  Administered 2020-08-11 – 2020-08-13 (×3): 2 mg via INTRAMUSCULAR
  Filled 2020-08-11 (×5): qty 1

## 2020-08-11 MED ORDER — DIPHENHYDRAMINE HCL 50 MG/ML IJ SOLN
50.0000 mg | Freq: Four times a day (QID) | INTRAMUSCULAR | Status: DC | PRN
  Administered 2020-08-11 – 2020-08-13 (×5): 50 mg via INTRAMUSCULAR
  Filled 2020-08-11 (×6): qty 1

## 2020-08-11 MED ORDER — ZIPRASIDONE MESYLATE 20 MG IM SOLR
20.0000 mg | Freq: Once | INTRAMUSCULAR | Status: AC
  Administered 2020-08-11: 20:00:00 20 mg via INTRAMUSCULAR
  Filled 2020-08-11: qty 20

## 2020-08-11 MED ORDER — HALOPERIDOL LACTATE 5 MG/ML IJ SOLN
5.0000 mg | Freq: Four times a day (QID) | INTRAMUSCULAR | Status: DC | PRN
  Administered 2020-08-11 – 2020-08-13 (×4): 5 mg via INTRAMUSCULAR
  Filled 2020-08-11 (×5): qty 1

## 2020-08-11 NOTE — BH Assessment (Signed)
Confirmed with [facility] ([phone]), patient is on their Waitlist.  

## 2020-08-11 NOTE — ED Notes (Signed)
Patient beating on walls in unit.  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  Attempted to explain plan of care to patient but patient would interrupt and continue talking about his cousins.

## 2020-08-11 NOTE — ED Notes (Signed)
Patient is talking on the phone, He is more content if He is talking to someone, nurse stood at door and talked to him in length as well as guards and tech, will continue to monitor.

## 2020-08-11 NOTE — ED Notes (Signed)
Patient is awake and standing at door.  Patient agreeable to take medications.

## 2020-08-11 NOTE — BH Assessment (Addendum)
[facility] ([phone]) Pt currently on the wait list

## 2020-08-11 NOTE — ED Provider Notes (Signed)
Emergency Medicine Observation Re-evaluation Note  Ernest Cook is a 29 y.o. male, seen on rounds today.  Pt initially presented to the ED for complaints of Psychiatric Evaluation and Schizophrenia (Pt has a hx of schizoaffective disorder. ) Currently, the patient is pacing back and forth in the hallway talking to other people who are not there.  Just a few minutes earlier he was banging on the window to the office in the psych ward and screaming.  Physical Exam  BP 132/76 (BP Location: Right Arm)   Pulse 76   Temp 98.6 F (37 C) (Oral)   Resp 14   Ht 5\' 9"  (1.753 m)   Wt 90.7 kg   SpO2 97%   BMI 29.53 kg/m  Physical Exam General: Exhibiting signs of florid psychosis pacing the floors and speaking to people who are not there. Lungs: Patient in no respiratory distress Psych: See general above  ED Course / MDM      Plan  Current plan is for patient being seen by psychiatry have put another consult and to notify them of this behavior. Patient is under full IVC at this time.   , MD 08/11/20 [phone]

## 2020-08-11 NOTE — ED Notes (Signed)
Dr. Vicente Males in to see patient.

## 2020-08-11 NOTE — ED Notes (Signed)
Patient continues banging on wall, getting louder each time.

## 2020-08-11 NOTE — ED Notes (Signed)
Lunch tray given. 

## 2020-08-11 NOTE — ED Notes (Signed)
Patient took His medication, He was coaxed into taking, He talks fast and some of what He says does not make sense, sentences don't connect, Patient has breakfast tray, He has no signs of aggression at this time, staff will continue to monitor him, camera surveillance in progress also for safety.

## 2020-08-11 NOTE — ED Notes (Signed)
Pt is agitate at this moment. Pt is bagging at the window and door. Vs will be assess when pt is more calm.

## 2020-08-11 NOTE — ED Notes (Signed)
Shower supplies given to pt. Pt is currently in the shower.

## 2020-08-11 NOTE — ED Notes (Signed)
Snacks and apple juice provided.

## 2020-08-11 NOTE — ED Notes (Signed)
Patient is IVC pending placement 

## 2020-08-11 NOTE — ED Notes (Signed)
Unable to obtain vs. Pt is agitate and bagging the window.

## 2020-08-11 NOTE — ED Notes (Signed)
Patient continuing to bang on wall, when instructed to stop, patient states he is ready to go.  Attempted to reorient and redirect patient without success.

## 2020-08-11 NOTE — ED Notes (Signed)
Patient banging on window/wall, patient instructed by security to not bang on the walls.

## 2020-08-11 NOTE — ED Notes (Signed)
Patient banging on walls, windows and door.  Patient will not be redirected at this time.

## 2020-08-11 NOTE — ED Notes (Signed)
Nurse went to the door and opened it to get His shower items and dirty scrubs to discard, and He refused letting nurse shut the door, guard came and then He ask for the phone, we told him Yes" and He let the door close, Patient is talking , but only some things are sensible, Nurse will continue to monitor, camera surveillance in progress for safety.

## 2020-08-12 MED ORDER — LORAZEPAM 2 MG/ML IJ SOLN
4.0000 mg | Freq: Once | INTRAMUSCULAR | Status: DC
  Filled 2020-08-12: qty 2

## 2020-08-12 MED ORDER — LITHIUM CITRATE 300 MG/5 ML PO SYRP
300.0000 mg | Freq: Three times a day (TID) | ORAL | Status: DC
Start: 2020-08-12 — End: 2020-08-21
  Administered 2020-08-12 – 2020-08-21 (×27): 300 mg via ORAL
  Filled 2020-08-12 (×28): qty 5

## 2020-08-12 MED ORDER — LORAZEPAM 2 MG/ML IJ SOLN
INTRAMUSCULAR | Status: AC
  Administered 2020-08-12: 22:00:00 6 mg via INTRAMUSCULAR
  Filled 2020-08-12: qty 3

## 2020-08-12 MED ORDER — LORAZEPAM 2 MG/ML IJ SOLN
4.0000 mg | Freq: Once | INTRAMUSCULAR | Status: AC
  Administered 2020-08-12: 18:00:00 4 mg via INTRAMUSCULAR

## 2020-08-12 MED ORDER — LORAZEPAM 2 MG/ML IJ SOLN: 6.0000 mg | Freq: Once | INTRAMUSCULAR | Status: AC

## 2020-08-12 MED ORDER — ZIPRASIDONE MESYLATE 20 MG IM SOLR
20.0000 mg | Freq: Once | INTRAMUSCULAR | Status: AC
  Administered 2020-08-12: 15:00:00 20 mg via INTRAMUSCULAR

## 2020-08-12 MED ORDER — RISPERIDONE 1 MG PO TBDP
3.0000 mg | ORAL_TABLET | Freq: Every day | ORAL | Status: DC
  Administered 2020-08-12 – 2020-08-20 (×9): 3 mg via ORAL
  Filled 2020-08-12 (×10): qty 3

## 2020-08-12 MED ORDER — LORAZEPAM 2 MG/ML IJ SOLN: 6.0000 mg | Freq: Once | INTRAMUSCULAR | Status: DC

## 2020-08-12 MED ORDER — DROPERIDOL 2.5 MG/ML IJ SOLN
10.0000 mg | Freq: Once | INTRAMUSCULAR | Status: AC
  Administered 2020-08-12: 07:00:00 10 mg via INTRAMUSCULAR
  Filled 2020-08-12: qty 4

## 2020-08-12 MED ORDER — DROPERIDOL 2.5 MG/ML IJ SOLN
10.0000 mg | Freq: Once | INTRAMUSCULAR | Status: DC
  Filled 2020-08-12: qty 4

## 2020-08-12 MED ORDER — HALOPERIDOL LACTATE 2 MG/ML PO CONC
5.0000 mg | Freq: Two times a day (BID) | ORAL | Status: DC
  Administered 2020-08-12 – 2020-08-21 (×16): 5 mg via ORAL
  Filled 2020-08-12 (×19): qty 2.5

## 2020-08-12 MED ORDER — KETAMINE HCL 50 MG/ML IJ SOLN: 350.0000 mg | Freq: Once | INTRAMUSCULAR | Status: DC

## 2020-08-12 NOTE — ED Notes (Signed)
Pt up, states he wants something to drink and eat. Po fluids and meal tray provided.

## 2020-08-12 NOTE — ED Notes (Signed)
Pt lying in bed, moving intermittently. Informed security will group at 2100 to give pt 2200 medication, obtain vital signs, and offer pt something to eat and drink.

## 2020-08-12 NOTE — ED Notes (Signed)
Pt asked about using the phone stating "I know I was an asshole". This RN told pt that he could use the phone tomorrow. Pt stated "that's okay i'll be home by then".

## 2020-08-12 NOTE — ED Notes (Signed)
Per Security officer April, patient tried to pull door open as she was trying to shut it.

## 2020-08-12 NOTE — ED Notes (Signed)
Latoya from [place] called to check on patient status.  RN explained pt was on the [facility] waiting list.  Latoya: [phone] Crisis: [phone] Office: [phone]

## 2020-08-12 NOTE — ED Notes (Signed)
Pt pounding loudly on window of nurse's station. EDP made aware.

## 2020-08-12 NOTE — ED Notes (Signed)
Pt too agitated to allow VS at this time.

## 2020-08-12 NOTE — ED Notes (Addendum)
Pt pounding on door, agitating other patients on the unit.

## 2020-08-12 NOTE — ED Notes (Addendum)
Pt beating on window.  Pt told Engineer, materials he "wants everyone to grow up the right way."

## 2020-08-12 NOTE — ED Notes (Signed)
Patient banging on the walls.  In to talk with patient and attempt to redirect.  Patient asking to call family members, explained to patient it was 3 am and phone calls not allowed at this time.  Patient demanding phone, again explained no.  Patient continues to bang on walls.

## 2020-08-12 NOTE — ED Notes (Signed)
Lithium mixed in coke and given with lunch

## 2020-08-12 NOTE — ED Notes (Signed)
Pt able to be talked into getting IM medications. Multiple officers at bedside.

## 2020-08-12 NOTE — ED Notes (Signed)
Pt up banging on door.

## 2020-08-12 NOTE — ED Notes (Addendum)
Pt pounding on window and door.  Other patients become agitated. EDP made aware.  IM medications given with assistance of charge RN.

## 2020-08-12 NOTE — ED Notes (Signed)
Pt arousable to verbal stimuli, but is now calm. resps unlabored.

## 2020-08-12 NOTE — ED Notes (Signed)
Patient up banging on wall.

## 2020-08-12 NOTE — ED Notes (Signed)
Spoke with MD regarding patient and given order for medications.  Patient has returned to room and laid down.

## 2020-08-12 NOTE — ED Notes (Signed)
Pt needing IM medication.  Unable to obtain VS at this time.

## 2020-08-12 NOTE — ED Notes (Signed)
Pt arouses to verbal stimuli, resps unlabored, rate of 16.

## 2020-08-12 NOTE — ED Notes (Signed)
Pt banging on door.

## 2020-08-12 NOTE — ED Notes (Signed)
Pt calling 911 on phone and cussing people out. Phone unplugged. Officer standing there talking to pt. Pt banging on door and yelling.

## 2020-08-12 NOTE — ED Notes (Addendum)
Pt very agitated, yelling, screaming, beating doors, attempting to beat door down and windows.

## 2020-08-12 NOTE — ED Notes (Addendum)
Pt yelling screaming, beating door, security attempting to reason with pt. md notified. Of increased agitation. Pt threatening staff. Pt at times makes reasonable statements, but at other times has flight of ideas.

## 2020-08-12 NOTE — ED Notes (Signed)
EDP ordered IM medication.  Pt took injection willingly with security officers present.

## 2020-08-12 NOTE — ED Notes (Signed)
Pt banging on window.

## 2020-08-12 NOTE — ED Provider Notes (Signed)
Emergency Medicine Observation Re-evaluation Note  Ernest Cook is a 29 y.o. male, seen on rounds today.  Pt initially presented to the ED for complaints of Psychiatric Evaluation and Schizophrenia (Pt has a hx of schizoaffective disorder. ) Currently, the patient is again becoming increasingly agitated.  Just received droperidol but is pacing in the hallway and banging on the window again.  May need additional calming agents.  Physical Exam  BP (!) 130/91 (BP Location: Right Arm)    Pulse (!) 114    Temp 98.5 F (36.9 C) (Oral)    Resp 16    Ht 5\' 9"  (1.753 m)    Wt 90.7 kg    SpO2 100%    BMI 29.53 kg/m  Physical Exam General: agitated Cardiac: well perfused Lungs: unlabored resp Psych: agitated  ED Course / MDM  EKG:    I have reviewed the labs performed to date as well as medications administered while in observation.  Recent changes in the last 24 hours include none.  Plan  Current plan is for psych. Patient is under full IVC at this time.   , MD 08/12/20 [phone]

## 2020-08-12 NOTE — ED Notes (Signed)
Pt took only aspirin willingly. Other meds crushed and placed in juice. Pt holding juice and officer attempting to help pt drink juice. Pt remains paranoid.

## 2020-08-12 NOTE — ED Notes (Addendum)
Ed md smith in to assess pt with this rn. Pt with unlabored resps, moist oral mucus membranes, pt is sleepy, has some slurred speech and some coherent speech. Pt has freedom of movement is able to move all extremities. Skin normal color warm and dry.

## 2020-08-12 NOTE — ED Notes (Signed)
Pt beating on walls and windows again. Unable to redirect. Pt aggravating other patients and threatening to perform sexual acts to them. Officer at door attempting to redirect.

## 2020-08-12 NOTE — ED Notes (Signed)
Pt provided with chips and soda as per agreement for medication compliance.

## 2020-08-12 NOTE — ED Notes (Addendum)
Pt pounding on window more frequently.  More difficult to redirect.

## 2020-08-12 NOTE — ED Notes (Signed)
Pt intermittently banging on door and lying in bed.

## 2020-08-12 NOTE — ED Notes (Signed)
Pt knocking at window.

## 2020-08-12 NOTE — ED Notes (Signed)
Given dinner tray

## 2020-08-12 NOTE — ED Notes (Signed)
Pt continues to beat on window and [place] door.

## 2020-08-12 NOTE — ED Notes (Signed)
Pharmacy called to change lithium back to liquid. Dr. Roxan Hockey in agreement.

## 2020-08-13 DIAGNOSIS — F250 Schizoaffective disorder, bipolar type: Secondary | ICD-10-CM | POA: Diagnosis not present

## 2020-08-13 MED ORDER — LORAZEPAM 2 MG/ML IJ SOLN
2.0000 mg | Freq: Four times a day (QID) | INTRAMUSCULAR | Status: DC | PRN
  Administered 2020-08-13: 13:00:00 2 mg via INTRAMUSCULAR
  Filled 2020-08-13: qty 1

## 2020-08-13 MED ORDER — DIPHENHYDRAMINE HCL 50 MG/ML IJ SOLN: 50.0000 mg | Freq: Four times a day (QID) | INTRAMUSCULAR | Status: DC | PRN

## 2020-08-13 MED ORDER — HALOPERIDOL 5 MG PO TABS
10.0000 mg | ORAL_TABLET | Freq: Two times a day (BID) | ORAL | Status: DC
  Administered 2020-08-13: 10:00:00 5 mg via ORAL
  Administered 2020-08-13 – 2020-08-20 (×12): 10 mg via ORAL
  Filled 2020-08-13 (×13): qty 2

## 2020-08-13 MED ORDER — LORAZEPAM 2 MG/ML IJ SOLN
4.0000 mg | Freq: Once | INTRAMUSCULAR | Status: AC
  Administered 2020-08-13: 06:00:00 4 mg via INTRAMUSCULAR

## 2020-08-13 MED ORDER — HALOPERIDOL LACTATE 5 MG/ML IJ SOLN
10.0000 mg | Freq: Four times a day (QID) | INTRAMUSCULAR | Status: DC | PRN
  Administered 2020-08-13: 13:00:00 10 mg via INTRAMUSCULAR
  Filled 2020-08-13: qty 2

## 2020-08-13 NOTE — ED Notes (Signed)
Pt lying in bed again.

## 2020-08-13 NOTE — ED Notes (Signed)
Robin with security speaking with pt through [place] door.

## 2020-08-13 NOTE — ED Notes (Signed)
Robin with security speaking with pt.robin encouraging pt to please lie down and attempt to rest. Pt back to bed.

## 2020-08-13 NOTE — ED Notes (Signed)
Pt now sleeping

## 2020-08-13 NOTE — ED Notes (Signed)
Report to mary, rn.  

## 2020-08-13 NOTE — ED Notes (Signed)
Pt stating that he needs to go home so he can go to his grandmas house and see what he got for christmas, that its not fair that hes in here. He states that he just was walking on the road.  Pt will beat on glass window in order to get staff attention.

## 2020-08-13 NOTE — ED Notes (Signed)
Pt back up, beating on door and window repeatedly.

## 2020-08-13 NOTE — ED Notes (Signed)
Pt standing at the door, intermittently knocking.

## 2020-08-13 NOTE — ED Notes (Signed)
Pt requested to use the phone and this nurse informed him that he had been banned from phone use due to calling 911. He promised he only wanted to call his grandmother. I allowed him to use the phone, and then received a call from the secretary stating that someone was calling 911. When I approached the pt, he said he was trying to call someone, and it appeared that he was having trouble dialing. I assisted him in calling 4 different numbers and then encouraged him to give up and try again later. Pt became belligerent, stating he wanted someone to come get him. When I firmly requested the phone several more times, it appeared that he was going to throw it at me, but he threw it on the floor instead. He then picked up his cup of water and threw it on me.

## 2020-08-13 NOTE — ED Notes (Signed)
Pt up again beating on window.

## 2020-08-13 NOTE — ED Notes (Signed)
Pt banging on window and this nurse asked him what he wanted. I was unable to understand him so I walked to the door and asked him what he needed. I was standing in the door with it partially open and he asked to use the phone. I explained to him that someone else was on the phone and that the phone would be available from 1-3. He pointed into the dayroom and said that he wanted to go there. I told him no and attempted to close the door. He pulled the door and grabbed this nurse's right wrist and pulled hard and twisted.

## 2020-08-13 NOTE — ED Notes (Signed)
Pt banging on door (somewhat lightly).

## 2020-08-13 NOTE — ED Notes (Signed)
Pt lying on his bed at this time.

## 2020-08-13 NOTE — ED Notes (Addendum)
This nurse, accompanied by security, gave pt injections of benadryl, haldol, ativan. Pt refused deltoid but agreed to have in his ventrogluteal region.   Security able to retrieve pieces of the TV/tv case that pt pulled off.

## 2020-08-13 NOTE — ED Notes (Signed)
Pt continues to beat on window and doors, pt increasing with agitation.

## 2020-08-13 NOTE — ED Provider Notes (Signed)
Emergency Medicine Observation Re-evaluation Note  Ernest Cook is a 29 y.o. male, seen on rounds today.  Pt initially presented to the ED for complaints of Psychiatric Evaluation and Schizophrenia (Pt has a hx of schizoaffective disorder. ) Currently, the patient is calm.  Physical Exam  BP (!) 130/103 (BP Location: Right Wrist)   Pulse 90   Temp 97.8 F (36.6 C) (Axillary)   Resp 17   Ht 5\' 9"  (1.753 m)   Wt 90.7 kg   SpO2 100%   BMI 29.53 kg/m  Physical Exam General: calm but refusing meds,interittently knocking on door but for questions.  Cardiac: normal rate Lungs: clear Psych: calm   ED Course / MDM  EKG:    I have reviewed the labs performed to date as well as medications administered while in observation.  Recent changes in the last 24 hours include none.  Plan  Current plan is for placement for hospital admission. Patient is under full IVC at this time.   , MD 08/13/20 08/15/20

## 2020-08-13 NOTE — ED Notes (Signed)
Pt refused to take 2 pills of haldol. He took 1.

## 2020-08-13 NOTE — ED Notes (Signed)
Patient took HS medications in grape juice.

## 2020-08-13 NOTE — ED Notes (Signed)
Pt calm now, lying in bed.

## 2020-08-13 NOTE — ED Notes (Signed)
Pt eating tray and drinking po fluids.

## 2020-08-13 NOTE — ED Notes (Signed)
Pt medication given. Pt asking about going home to see what he got for Christmas. He states that he didn't do anything to anyone. Pt alert, eyes heavy but open.

## 2020-08-13 NOTE — ED Notes (Signed)
Patient currently laying in bed and appears to be sleeping.  Will continue to monitor.

## 2020-08-13 NOTE — ED Notes (Signed)
Pt beating on door; another pt in dayroom yelling at pt to stop as he has not been able to sleep. This nurse questioned what pt needed. He rambled on about a rottweiler and his grandmother, with his eyes heavy. Pt encouraged to go back to bed. He refused and continues to tap on window and door.

## 2020-08-13 NOTE — ED Notes (Signed)
Pt very agitated, beating on door, window, disturbing other patients. Pt is not able to be redirected.

## 2020-08-13 NOTE — ED Notes (Signed)
Pt continues to beat on door. This rn speaking with pt. Pt offered po fluids and meal tray. Pt accepts.

## 2020-08-13 NOTE — ED Notes (Signed)
Pt awake, beating on [place]ally [place] door again. Pt offered po fluids with lithium, pt accepts.

## 2020-08-13 NOTE — ED Notes (Signed)
Pt beating on doors and windows, attempted to speak with pt without success. md notified pt is increasing in agitation again.

## 2020-08-13 NOTE — ED Notes (Signed)
Pt's cousin, Ernest Cook (sp?) called an hour ago and said that Ernest Cook usually calls her daily and she had not heard from him yet. I took her number and told her I would give him the phone to call when he awakened. Took the phone to him and tried to call her - no answer. Message left. Ernest Cook unwilling to give up the phone but we got the phone from him. Told Ernest Cook we would bring him the phone when she calls back.

## 2020-08-13 NOTE — ED Notes (Signed)
Pt up and beating on window.

## 2020-08-13 NOTE — ED Notes (Addendum)
Pt up beating on doors and windows again. Security and this Rn over to speak with pt.

## 2020-08-13 NOTE — ED Notes (Signed)
Pt lying in bed at this time

## 2020-08-13 NOTE — ED Notes (Signed)
Pt seen pulling on the television in a room. He appears to have pulled something off it and is banging the window with it. Because pt has been violent, there is concern for going in as he now has a weapon. This nurse is also concerned for pt's own safety. Requested an order for medication. Dr. Toni Amend to order.

## 2020-08-13 NOTE — ED Notes (Signed)
Pt banging on window, asking to use the phone again.

## 2020-08-13 NOTE — ED Notes (Signed)
Attempted to speak with pt. Pt states "I don't trust you". Pt then states he will choke this RN. Pt is not able to be redirected, beating loudly on glass. md notified.

## 2020-08-13 NOTE — ED Notes (Signed)
Pt lying on bed at this time.

## 2020-08-13 NOTE — ED Notes (Signed)
Patient hitting on window and door.  This Clinical research associate spoke with patient with security. Patient asking for blanket. Writer gave patient a blanket and a snack. Patient currently laid back in bed.

## 2020-08-13 NOTE — ED Notes (Signed)
During the conversation, pt stated that his mother had him committed and that she has "too much control." He stated that he was brought in "for nothing" and that when he sees his mother he is going to "tear her throat out." This nurse told pt that being violent or threatening violence is not helping his cause. He repeated that he is going to "tear her throat out."

## 2020-08-13 NOTE — ED Notes (Signed)
Pt lying in bed  

## 2020-08-13 NOTE — ED Notes (Signed)
After eating tray, pt back to door banging on glass and door.

## 2020-08-13 NOTE — ED Notes (Signed)
Pt sleeping, resps unlabored.  

## 2020-08-13 NOTE — Consult Note (Signed)
[facility] Face-to-Face Psychiatry Consult   Reason for Consult: Follow-up consult 29 year old man with schizoaffective disorder Referring Physician: Derrill Kay Patient Identification: ROSE HIPPLER MRN:  [id number] Principal Diagnosis: Schizoaffective disorder, bipolar type (HCC) Diagnosis:  Principal Problem:   Schizoaffective disorder, bipolar type (HCC) Active Problems:   Cannabis use disorder, moderate, dependence (HCC)   Total Time spent with patient: 30 minutes  Subjective:   Ernest Cook is a 29 y.o. male patient admitted with "I am okay".  HPI: Patient seen chart reviewed.  29 year old man has been in the emergency room for several days.  This was a return after a long hospitalization that was ultimately terminated by discharge home.  He returned just as manic and agitated as previously.  Today when I came in in the morning the patient was kicking on the wall and pounding on a window which he continued to do intermittently for long stretches of time throughout the day.  He took several doses of as needed medicine for him to slow down with this.  During the time that he is agitated he will not have any specific request.  Appears angry paranoid and hostile.  He ultimately did eat and fell asleep after several doses of medicine.  Past Psychiatric History: Multiple prior hospitalizations.  This is a long stretch of decompensation that is not getting better  Risk to Self:   Risk to Others:   Prior Inpatient Therapy:   Prior Outpatient Therapy:    Past Medical History:  Past Medical History:  Diagnosis Date  . Schizo affective schizophrenia [facility])     Past Surgical History:  Procedure Laterality Date  . ORIF HUMERUS FRACTURE Right 06/27/2020   Procedure: OPEN REDUCTION INTERNAL FIXATION (ORIF) HUMERAL SHAFT FRACTURE;  Surgeon: Roby Lofts, MD;  Location: [facility] OR;  Service: Orthopedics;  Laterality: Right;  . SKIN GRAFT Left unknown   Pt reports left foot and leg skin graft   Family  History:  Family History  Problem Relation Age of Onset  . Diabetes Mother   . Hypertension Mother    Family Psychiatric  History: See previous Social History:  Social History   Substance and Sexual Activity  Alcohol Use No     Social History   Substance and Sexual Activity  Drug Use Yes  . Types: Marijuana   Comment: "every now and again"    Social History   Socioeconomic History  . Marital status: Single    Spouse name: Not on file  . Number of children: Not on file  . Years of education: Not on file  . Highest education level: Not on file  Occupational History  . Not on file  Tobacco Use  . Smoking status: Current Every Day Smoker    Packs/day: 0.25    Types: Cigarettes  . Smokeless tobacco: Never Used  Substance and Sexual Activity  . Alcohol use: No  . Drug use: Yes    Types: Marijuana    Comment: "every now and again"  . Sexual activity: Yes    Birth control/protection: Condom    Comment: Pt reports he is attracted to men  Other Topics Concern  . Not on file  Social History Narrative  . Not on file   Social Determinants of Health   Financial Resource Strain: Not on file  Food Insecurity: Not on file  Transportation Needs: Not on file  Physical Activity: Not on file  Stress: Not on file  Social Connections: Not on file   Additional Social  History:    Allergies:   Allergies  Allergen Reactions  . Penicillins Other (See Comments)    Seizure when he was 19    Labs: No results found for this or any previous visit (from the past 48 hour(s)).  Current Facility-Administered Medications  Medication Dose Route Frequency Provider Last Rate Last Admin  . aspirin EC tablet 81 mg  81 mg Oral Daily Colvin Blatt, Jackquline Denmark, MD   81 mg at 08/13/20 0957  . atomoxetine (STRATTERA) capsule 40 mg  40 mg Oral Daily Miran Kautzman, Jackquline Denmark, MD   40 mg at 08/13/20 0959  . benztropine (COGENTIN) tablet 1 mg  1 mg Oral QHS Jaylynne Birkhead, Jackquline Denmark, MD   1 mg at 08/12/20 2134  .  cholecalciferol (VITAMIN D3) tablet 2,000 Units  2,000 Units Oral Daily Althia Egolf, Jackquline Denmark, MD   2,000 Units at 08/13/20 [phone]  . diphenhydrAMINE (BENADRYL) injection 50 mg  50 mg Intramuscular Q6H PRN Merwyn Katos, MD   50 mg at 08/13/20 1256  . diphenhydrAMINE (BENADRYL) injection 50 mg  50 mg Intramuscular Q6H PRN Sreshta Cressler T, MD      . haloperidol (HALDOL) 2 MG/ML solution 5 mg  5 mg Oral BID Tressie Ellis, RPH   5 mg at 08/12/20 2134  . haloperidol (HALDOL) tablet 10 mg  10 mg Oral BID Kenneth Lax T, MD   5 mg at 08/13/20 1022  . haloperidol lactate (HALDOL) injection 10 mg  10 mg Intramuscular Q6H PRN Zakariyya Helfman T, MD   10 mg at 08/13/20 1256  . haloperidol lactate (HALDOL) injection 5 mg  5 mg Intramuscular Q6H PRN Merwyn Katos, MD   5 mg at 08/13/20 0240  . lithium citrate 300 MG/5ML solution 300 mg  300 mg Oral Q8H Librada Castronovo T, MD   300 mg at 08/13/20 1609  . LORazepam (ATIVAN) injection 2 mg  2 mg Intramuscular Q4H PRN Merwyn Katos, MD   2 mg at 08/13/20 0240  . LORazepam (ATIVAN) injection 2 mg  2 mg Intramuscular Q6H PRN Jariya Reichow, Jackquline Denmark, MD   2 mg at 08/13/20 1257  . risperiDONE (RISPERDAL M-TABS) disintegrating tablet 3 mg  3 mg Oral QHS Tressie Ellis, RPH   3 mg at 08/12/20 2134   Current Outpatient Medications  Medication Sig Dispense Refill  . aspirin EC 81 MG EC tablet Take 1 tablet (81 mg total) by mouth daily. Swallow whole. 30 tablet 1  . atomoxetine (STRATTERA) 40 MG capsule Take 1 capsule (40 mg total) by mouth every morning. 30 capsule 1  . benztropine (COGENTIN) 1 MG tablet Take 1 tablet (1 mg total) by mouth at bedtime. 30 tablet 1  . haloperidol (HALDOL) 10 MG tablet Take 10 mg by mouth at bedtime.    Marland Kitchen lithium carbonate (ESKALITH) 450 MG CR tablet Take 1 tablet (450 mg total) by mouth every 12 (twelve) hours. 60 tablet 1  . methocarbamol (ROBAXIN) 500 MG tablet Take 1 tablet (500 mg total) by mouth every 6 (six) hours as needed for muscle spasms.  60 tablet 1  . risperiDONE (RISPERDAL) 3 MG tablet Take 1 tablet (3 mg total) by mouth at bedtime. 30 tablet 1  . risperiDONE microspheres (RISPERDAL CONSTA) 50 MG injection Inject 2 mLs (50 mg total) into the muscle every 14 (fourteen) days. 1 each 1  . Vitamin D3 (VITAMIN D) 25 MCG tablet Take 2 tablets (2,000 Units total) by mouth daily. 60 tablet 1  Musculoskeletal: Strength & Muscle Tone: within normal limits Gait & Station: normal Patient leans: N/A  Psychiatric Specialty Exam: Physical Exam Vitals and nursing note reviewed.  Constitutional:      Appearance: He is well-developed and well-nourished.  HENT:     Head: Normocephalic and atraumatic.  Eyes:     Conjunctiva/sclera: Conjunctivae normal.     Pupils: Pupils are equal, round, and reactive to light.  Cardiovascular:     Heart sounds: Normal heart sounds.  Pulmonary:     Effort: Pulmonary effort is normal.  Abdominal:     Palpations: Abdomen is soft.  Musculoskeletal:        General: Normal range of motion.     Cervical back: Normal range of motion.  Skin:    General: Skin is warm and dry.  Neurological:     General: No focal deficit present.     Mental Status: He is alert.  Psychiatric:        Attention and Perception: He is inattentive.        Mood and Affect: Affect is labile, angry and inappropriate.        Speech: He is noncommunicative.        Behavior: Behavior is agitated and aggressive.        Thought Content: Thought content is delusional.        Cognition and Memory: Cognition is impaired. Memory is impaired.        Judgment: Judgment is inappropriate.     Review of Systems  Constitutional: Negative.   HENT: Negative.   Eyes: Negative.   Respiratory: Negative.   Cardiovascular: Negative.   Gastrointestinal: Negative.   Musculoskeletal: Negative.   Skin: Negative.   Neurological: Negative.   Psychiatric/Behavioral: Positive for confusion and dysphoric mood.    Blood pressure (!) 130/103,  pulse 90, temperature 97.8 F (36.6 C), temperature source Axillary, resp. rate 17, height 5\' 9"  (1.753 m), weight 90.7 kg, SpO2 100 %.Body mass index is 29.53 kg/m.  General Appearance: Casual  Eye Contact:  Minimal  Speech:  Slurred  Volume:  Decreased  Mood:  Euthymic  Affect:  Constricted  Thought Process:  Disorganized  Orientation:  Full (Time, Place, and Person)  Thought Content:  Illogical  Suicidal Thoughts:  No  Homicidal Thoughts:  No  Memory:  Immediate;   Fair Recent;   Poor Remote;   Poor  Judgement:  Impaired  Insight:  Lacking  Psychomotor Activity:  Increased and Restlessness  Concentration:  Concentration: Poor  Recall:  Poor  Fund of Knowledge:  Poor  Language:  Poor  Akathisia:  No  Handed:  Right  AIMS (if indicated):     Assets:  Resilience Social Support  ADL's:  Impaired  Cognition:  Impaired,  Mild  Sleep:        Treatment Plan Summary: Daily contact with patient to assess and evaluate symptoms and progress in treatment, Medication management and Plan He continues to be agitated aggressive manic psychotic poor compliance.  Behavior completely unacceptable for inpatient unit.  Remains a danger to himself and others.  [facility].  Orders were placed today to restart his standing Haldol.  Disposition: Recommend psychiatric Inpatient admission when medically cleared.  , MD 08/13/2020 5:11 PM

## 2020-08-13 NOTE — ED Notes (Signed)
Chairs removed from locked unit as pt was trying to use a chair to break window. Extra mattresses also removed.

## 2020-08-13 NOTE — ED Notes (Addendum)
Pt awake, beating on door asking for phone and "people". This rn speaking to pt through door glass., pt informed that it is not time for a phone and explanation of phone hours provided to pt. Pt informed "it must be hard for you to be in there all alone". Pt states "yes it is" pt informed due to his behavior that he at this time cannot be out with others on floor. 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 Pt then begins to talk about how much he loves cats and wants to go home. Pt informed that he needs to rest while he can. Pt ambulatory back to his room.

## 2020-08-14 NOTE — ED Notes (Addendum)
Lunch tray given.  Visitor's cousin at bedside. Engineer, materials and pt's relationship staff with visitor.

## 2020-08-14 NOTE — ED Notes (Signed)
Patient received visit from cousin, agreed to have vital signs obtained during her visit.

## 2020-08-14 NOTE — ED Notes (Signed)
Patient woke up banging on window and demanding to utilize phone. Patient was provided the phone during phone hours, patient speaking to his cousin, whom told him she will be up here to visit him today.Ptient under the impression that he will be going home with his cousin. Today. Patient upset that he is still here, when asked for the phone back patient threw thew the phone at security as well as threw a cup of ice at security.

## 2020-08-14 NOTE — ED Notes (Signed)
Breakfast tray given. °

## 2020-08-14 NOTE — ED Notes (Signed)
Dinner tray given

## 2020-08-14 NOTE — ED Notes (Signed)
Patient awake and started knocking on window.  This Clinical research associate with security went to see what patient wanted.  Patient was asking to use phone. Writer explained that he would be able to use phone at 9am during phone times.  Patient asked for something to eat.  Writer gave patient some graham crackers. Patient jerked crackers out of hand and went into room.

## 2020-08-14 NOTE — ED Notes (Addendum)
This tech tried to get the phone from the pt. Pt refused to give phone to this tech. Security Energy manager preceded to ask the pt for phone. Pt got aggravated and agitated and threw phone at the floor and threw a cup of ice to security. Pt is banging at the window and door non-stop. Pt is yelling profanity and threating staff.

## 2020-08-14 NOTE — BH Assessment (Signed)
TTS made contact with [facility]- [phone]) who confirmed pt to be on the waitlist.

## 2020-08-14 NOTE — ED Notes (Signed)
Vital signs was attempted, patient aggitated and verbally abusive  refusing to have vital signs done.

## 2020-08-14 NOTE — ED Notes (Signed)
Pt asleep at this moment. This tech will attempt to get when pt is awake.

## 2020-08-14 NOTE — ED Notes (Signed)
Pt is still banging the window. Vs has been unable to obtain at this moment.

## 2020-08-14 NOTE — ED Notes (Signed)
Unable to obtain vitals due to patient sleeping. Will continue to monitor.   

## 2020-08-15 DIAGNOSIS — Z0289 Encounter for other administrative examinations: Secondary | ICD-10-CM

## 2020-08-15 NOTE — BH Assessment (Signed)
Contacted [facility] Cornfield [phone]) who confirmed pt to be on the waitlist

## 2020-08-15 NOTE — ED Notes (Signed)
Lunch tray given. 

## 2020-08-15 NOTE — ED Notes (Signed)
Shower supplies provided. Pt is taking a shower at this moment.

## 2020-08-15 NOTE — ED Notes (Signed)
Snacks and apple juice provided.  °

## 2020-08-15 NOTE — ED Notes (Signed)
IVC paperwork to be renewed on 08/15/20 by 8:12 PM.

## 2020-08-15 NOTE — ED Notes (Signed)
pt asleep at this moment. VS will be obtain when pt is awake. 

## 2020-08-15 NOTE — ED Notes (Signed)
Dinner tray given

## 2020-08-15 NOTE — ED Notes (Signed)
VS assessed this morning. Shower offered. Breakfast tray given.  °

## 2020-08-16 MED ORDER — ATOMOXETINE HCL 40 MG PO CAPS
40.0000 mg | ORAL_CAPSULE | Freq: Every day | ORAL | Status: DC
  Administered 2020-08-17 – 2020-08-19 (×3): 40 mg via ORAL
  Filled 2020-08-16 (×2): qty 1

## 2020-08-16 NOTE — ED Notes (Signed)
Pt given heads up that phone time would be over momentarily, to wrap up his call shortly. Pt agreeable.   Phone retrieved at approx [phone]

## 2020-08-16 NOTE — ED Notes (Signed)
Pt provided with crackers and peanut butter at this time.

## 2020-08-16 NOTE — ED Notes (Signed)
Hourly rounding completed at this time, patient currently asleep in room. No complaints, stable, and in no acute distress. Q15 minute rounds and monitoring via Security Cameras to continue. 

## 2020-08-16 NOTE — ED Notes (Signed)
Patient took His po medication that was due at 2 pm, He ate 100% of lunch and beverage, He is compliant and smiles, laughs often at this time, will continue to monitor.

## 2020-08-16 NOTE — ED Notes (Addendum)
Pt given dinner tray at this time.  

## 2020-08-16 NOTE — ED Notes (Signed)
Report received from Wendy, RN including  Situation, Background, Assessment, and Recommendations. Patient alert and oriented, warm and dry, in no acute distress. Patient denies SI, HI, AVH and pain. Patient made aware of Q15 minute rounds and security cameras for their safety. Patient instructed to come to this nurse with needs or concerns. 

## 2020-08-16 NOTE — ED Notes (Signed)
Patient provided with snack and beverage at this time.  ?

## 2020-08-16 NOTE — ED Notes (Signed)
Patient ask to use the phone, nurse did give him the phone. Patient is pleasant, no signs of distress.

## 2020-08-16 NOTE — ED Notes (Signed)
Hourly rounding completed at this time, patient currently eating in room. No complaints, stable, and in no acute distress. Q15 minute rounds and monitoring via [employer] to continue.

## 2020-08-16 NOTE — ED Notes (Signed)
Patient is talking on the phone, no behavioral issues at this time.

## 2020-08-16 NOTE — ED Notes (Signed)
Unable to obtain vitals due to patient sleeping. Will continue to monitor.   

## 2020-08-16 NOTE — ED Notes (Signed)
Pt is calm, cooperative. Pt smiling, sometimes heard laughing alone in room tv off. Pt states "I feel joyful this morning." Pt inquires about the number of meds in his cup, meds explained to pt. Pt takes willingly.   Pt given graham crackers, peanut butter, and phone for AM call per request. Call begun at 0911hrs

## 2020-08-16 NOTE — ED Notes (Signed)
Hourly rounding completed at this time, patient currently awake in room. No complaints, stable, and in no acute distress. Q15 minute rounds and monitoring via Security Cameras to continue. °

## 2020-08-17 NOTE — ED Notes (Signed)
EKG done

## 2020-08-17 NOTE — ED Notes (Signed)
Hourly rounding completed at this time, patient currently asleep in room. No complaints, stable, and in no acute distress. Q15 minute rounds and monitoring via Security Cameras to continue. 

## 2020-08-17 NOTE — ED Notes (Signed)
Report to include Situation, Background, Assessment, and Recommendations received from Jadeka RN. Patient alert and oriented, warm and dry, in no acute distress. Patient denies SI, HI, AVH and pain. Patient made aware of Q15 minute rounds and security cameras for their safety. Patient instructed to come to me with needs or concerns. 

## 2020-08-17 NOTE — ED Notes (Signed)
Hourly rounding reveals patient in room. No complaints, stable, in no acute distress. Q15 minute rounds and monitoring via Security Cameras to continue. 

## 2020-08-17 NOTE — ED Notes (Signed)
Pt asleep at this time, unable to collect vitals. Will collect pt vitals once awake. 

## 2020-08-17 NOTE — ED Notes (Signed)
Patient allowed nursing staff to get his vital signs and perform an EKG on him, he was pleasant and cooperative, laughing with staff, and also stating "im ready to go home" Patient asked for applejuice and staff gave it to him

## 2020-08-17 NOTE — ED Notes (Signed)
Patient sleeping at this time.

## 2020-08-17 NOTE — BH Assessment (Signed)
Contacted [facility] [phone]) who confirmed pt to be on the waitlist

## 2020-08-17 NOTE — ED Notes (Signed)
Patient became upset when nurse and security officer went in his room with his morning medications, patient began to state I am not taking any medications this morning, officer and nurse asked what was wrong, patient stated he was ready to go home, he wanted to be at his own home and see the ball drop on New Years, then he started talking about his mom and she was the reason he was here. Staff listened and allowed him to talk, he then finally agreed to take his medications.

## 2020-08-17 NOTE — ED Provider Notes (Signed)
Emergency Medicine Observation Re-evaluation Note  Ernest Cook is a 29 y.o. male, seen on rounds today.  Pt initially presented to the ED for complaints of Psychiatric Evaluation and Schizophrenia (Pt has a hx of schizoaffective disorder. ) Currently, the patient is sitting comfortably in his room.  When I arrived he was on the phone.  The patient is upset about still being in the BHU and states that he is fine and wants to go home.  He otherwise has no acute complaints.  Physical Exam  BP 130/89 (BP Location: Right Arm)   Pulse 88   Temp 98 F (36.7 C) (Oral)   Resp 17   Ht 5\' 9"  (1.753 m)   Wt 90.7 kg   SpO2 100%   BMI 29.53 kg/m  Physical Exam General: Alert, comfortable appearing. Cardiac: Normal peripheral perfusion Lungs: Normal respiratory effort Psych: Somewhat anxious appearing with pressured speech.  ED Course / MDM   I have reviewed the labs performed to date as well as medications administered while in observation.  There have been no recent changes in the last 24 hours.  Plan  Current plan is for placement per the psychiatry and/or social work teams. Patient is under full IVC at this time.   , MD 08/17/20 [phone]

## 2020-08-17 NOTE — ED Notes (Signed)
Snack and beverage given. 

## 2020-08-18 LAB — POC SARS CORONAVIRUS 2 AG -  ED: SARS Coronavirus 2 Ag: NEGATIVE

## 2020-08-18 NOTE — ED Notes (Signed)
Hourly rounding reveals patient in room. No complaints, stable, in no acute distress. Q15 minute rounds and monitoring via Security Cameras to continue. 

## 2020-08-18 NOTE — ED Notes (Signed)
Attempted to swab pt for covid rapid test but pt refused. Will reattempt later when pt more agreeable.

## 2020-08-18 NOTE — Progress Notes (Signed)
Patient ID: Ernest Cook, male   DOB: December 06, 1990, 30 y.o.   MRN: [id number]  Dinner tray given

## 2020-08-18 NOTE — Progress Notes (Signed)
Patient ID: Ernest Cook, male   DOB: 08/10/1991, 30 y.o.   MRN: [id number]  Lunch tray given

## 2020-08-18 NOTE — ED Notes (Signed)
Hourly rounding completed at this time, patient currently asleep in room. No complaints, stable, and in no acute distress. Q15 minute rounds and monitoring via Security Cameras to continue. 

## 2020-08-18 NOTE — ED Notes (Signed)
Report received from [facility] including  Situation, Background, Assessment, and Recommendations. Patient alert and oriented, warm and dry, in no acute distress. Patient denies SI, HI, AVH and pain. Patient made aware of Q15 minute rounds and security cameras for their safety. Patient instructed to come to this nurse with needs or concerns.

## 2020-08-18 NOTE — ED Notes (Signed)
Pt not communicating with this nurse during interactions, only shrugs shoulders when spoken to no matter the question or statement. PT taken his medications after prepared and lays in bed, not moving or looking at nurse. This nurse several times says pt name and asks to take medication, pt reluctantly gets up from laying position. Takes medications and puts head back on bed. When this nurse was exiting area pt can be heard thrashing in bed. Once back in office, this nurse looks at cameras and pt laying still. 2 minutes later he gets up to go to restroom and returns to bed.

## 2020-08-18 NOTE — ED Notes (Signed)
Hourly rounding completed at this time, patient currently awake in room. No complaints, stable, and in no acute distress. Q15 minute rounds and monitoring via Security Cameras to continue. 

## 2020-08-18 NOTE — ED Notes (Signed)
VS not taken, patient asleep 

## 2020-08-18 NOTE — Progress Notes (Signed)
Patient ID: Ernest Cook, male   DOB: 09-Jun-1991, 30 y.o.   MRN: [id number]  Pt offered a shower and took a shower

## 2020-08-18 NOTE — Progress Notes (Signed)
Patient ID: Ernest Cook, male   DOB: [birth year], 30 y.o.   MRN: [id number]  Pt. Given breakfast tray

## 2020-08-19 NOTE — ED Notes (Signed)
Hourly rounding completed at this time, patient currently asleep in room. No complaints, stable, and in no acute distress. Q15 minute rounds and monitoring via Security Cameras to continue. 

## 2020-08-19 NOTE — ED Notes (Signed)
Pt sleeping. Will obtain vitals and give meds once awake.

## 2020-08-19 NOTE — ED Notes (Signed)
Pt vitals obtained. Breakfast tray given. Pt meds handled by RN. Pt room and locked area was then cleaned and mopped by EVS.

## 2020-08-19 NOTE — BH Assessment (Signed)
[phone]) who confirmed pt to be on the waitlist

## 2020-08-19 NOTE — ED Notes (Signed)
Lunch tray given. 

## 2020-08-19 NOTE — ED Notes (Signed)
In to give patient medications and patient became hostile.  Stating that he felt like a lab rat, and this RN needed to quit messing with his medications, and bothering him while he is trying to rest.  This RN attempted to calm patient without success.  RN told patient she could not force him to take medications and she would leave with them, patient then states "just give me the damn medicines".  Patient took medications without difficulty.  This RN instructed patient to let staff know if he needed anything.

## 2020-08-19 NOTE — ED Notes (Signed)
Pt asleep at this time, unable to collect vitals. Will collect pt vitals once awake. 

## 2020-08-19 NOTE — ED Provider Notes (Signed)
Emergency Medicine Observation Re-evaluation Note  Ernest Cook is a 30 y.o. male, seen on rounds today.  Pt initially presented to the ED for complaints of Psychiatric Evaluation and Schizophrenia (Pt has a hx of schizoaffective disorder. ) Currently, the patient is resting on his bed.  No complaints..  Physical Exam  BP (!) 121/92 (BP Location: Left Arm)   Pulse 90   Temp 98.1 F (36.7 C) (Oral)   Resp 18   Ht 5\' 9"  (1.753 m)   Wt 90.7 kg   SpO2 100%   BMI 29.53 kg/m  Physical Exam General: Awake, calm cooperative, no distress Cardiac: Regular rate and rhythm around 80 bpm. Lungs: Clear to auscultation bilaterally. Psych: Normal mood and affect  ED Course / MDM  Patient had a Covid test performed yesterday that was negative.  Plan  Current plan is for placement at [facility] hospital once a bed becomes available. Patient is under full IVC at this time.   [facility], MD 08/19/20 1409

## 2020-08-19 NOTE — ED Notes (Signed)
EDP at bedside  

## 2020-08-19 NOTE — ED Notes (Signed)
Pt cooperative this morning. Takes meds without issue. Eats breakfast. Room mopped by EVS. Pt denies further needs at this time.

## 2020-08-19 NOTE — ED Notes (Signed)
Pt using the phone. No issues at this time.

## 2020-08-19 NOTE — ED Notes (Signed)
Pt given dinner tray and a ginger ale  

## 2020-08-20 LAB — SARS CORONAVIRUS 2 (TAT 6-24 HRS): SARS Coronavirus 2: NEGATIVE

## 2020-08-20 NOTE — ED Notes (Signed)
Hourly rounding reveals patient in room. No complaints, stable, in no acute distress. Q15 minute rounds and monitoring via Security Cameras to continue. 

## 2020-08-20 NOTE — ED Notes (Signed)
Gave pt breakfast tray with juice. 

## 2020-08-20 NOTE — ED Notes (Signed)
Snack and beverage given. 

## 2020-08-20 NOTE — BH Assessment (Signed)
TTS received call from Fleet Contras Field seismologist) at [facility] [phone] stating they are unsure if and when patient will be accepted due to the current movement of their wait list. Fleet Contras suggested referring patient to other facilities that might accept patient sooner. Fleet Contras stated she is available to offer assistance and will call back within the week to check up.

## 2020-08-20 NOTE — ED Notes (Signed)
Pt sleeping. VS will be taken once awake. 

## 2020-08-20 NOTE — ED Notes (Signed)
Report to include Situation, Background, Assessment, and Recommendations received from Amy RN. Patient alert and oriented, warm and dry, in no acute distress. Patient denies SI, HI, AVH and pain. Patient made aware of Q15 minute rounds and security cameras for their safety. Patient instructed to come to me with needs or concerns.  

## 2020-08-20 NOTE — ED Provider Notes (Signed)
Emergency Medicine Observation Re-evaluation Note  Ernest Cook is a 30 y.o. male, seen on rounds today.  Pt initially presented to the ED for complaints of Psychiatric Evaluation and Schizophrenia (Pt has a hx of schizoaffective disorder. ) Currently, the patient is sitting on his bed, denies any complaints.  Physical Exam  BP 113/76 (BP Location: Right Arm)   Pulse 82   Temp 97.6 F (36.4 C) (Oral)   Resp 17   Ht 5\' 9"  (1.753 m)   Wt 90.7 kg   SpO2 100%   BMI 29.53 kg/m  Physical Exam Constitutional: Resting comfortably. Eyes: Conjunctivae are normal. Head: Atraumatic. Nose: No congestion/rhinnorhea. Mouth/Throat: Mucous membranes are moist. Neck: Normal ROM Cardiovascular: No cyanosis noted. Respiratory: Normal respiratory effort. Gastrointestinal: Non-distended. Genitourinary: deferred Musculoskeletal: No lower extremity tenderness nor edema. Neurologic:  Normal speech and language. No gross focal neurologic deficits are appreciated. Skin:  Skin is warm, dry and intact. No rash noted.    ED Course / MDM  EKG:    I have reviewed the labs performed to date as well as medications administered while in observation.  Recent changes in the last 24 hours include patient exposed to covid by another patient in the BHU, we will repeat testing.  Plan  Current plan is for psychiatric admission, pending placement. Patient is under full IVC at this time.   , MD 08/20/20 1139

## 2020-08-21 NOTE — ED Notes (Signed)
SHERIFF  DEPT  CALLED  FOR  TRANSPORT  TO  [facility]   

## 2020-08-21 NOTE — ED Notes (Signed)
Hourly rounding reveals patient in room. No complaints, stable, in no acute distress. Q15 minute rounds and monitoring via Security Cameras to continue. 

## 2020-08-21 NOTE — ED Provider Notes (Signed)
Emergency Medicine Observation Re-evaluation Note  Ernest Cook is a 30 y.o. male, seen on rounds today.  Pt initially presented to the ED for complaints of Psychiatric Evaluation and Schizophrenia (Pt has a hx of schizoaffective disorder. ) Currently, the patient is calm, resting, in NAD.  Physical Exam  BP 131/74 (BP Location: Left Arm)   Pulse 92   Temp 98.3 F (36.8 C) (Oral)   Resp 18   Ht 5\' 9"  (1.753 m)   Wt 90.7 kg   SpO2 100%   BMI 29.53 kg/m  Physical Exam General: Calm, sleeping in NAD Cardiac: Well pefused Lungs: Normal WOB, no distress Psych: Resting, calm  ED Course / MDM  EKG:    I have reviewed the labs performed to date as well as medications administered while in observation.  Recent changes in the last 24 hours include None.  Plan  Current plan is for IVC, reportedly accepted to OSH. Patient is under full IVC at this time.   , MD 08/21/20 [phone]

## 2020-08-21 NOTE — ED Notes (Signed)
VS not taken, patient asleep 

## 2020-08-21 NOTE — BH Assessment (Signed)
Patient has been accepted to [facility].  Patient assigned to [facility] Accepting physician is Dr. Betti Cruz.  Call report to [phone] or [phone].  Representative was [employer].   ER Staff is aware of it:  Glenda, ER Secretary  Dr. Katrinka Blazing, ER MD  Lacinda Axon, Patient's Nurse     Please be advised: Pt can be transported anytime after 9am on 08/21/20. Pt should report to the [employer] for covid testing.

## 2020-08-21 NOTE — ED Notes (Signed)
Attempted to call report. No answer.

## 2020-08-21 NOTE — BH Assessment (Addendum)
Due to a long waitlist at [facility], referral information for inpatient placement have been faxed to:    [facility] [phone]) No answer    [facility] ([phone]--[phone])   [name] [phone] or [phone])    [name] [phone]),    [facility] [phone]),   . [name] [phone]---[phone]---[phone]),  . [name] [phone], [phone], [phone] or [phone]),   . [facility] [phone]),   . [facility] [phone]).

## 2020-08-21 NOTE — ED Notes (Signed)
Pt discharged under IVC to [facility]. VS stable. Belongings sent with officer. Pt cooperative.

## 2020-08-21 NOTE — ED Notes (Signed)
Attempted to call report.  After being transferred to the unit, the phone was unanswered.

## 2020-09-04 ENCOUNTER — Other Ambulatory Visit: Payer: Self-pay | Admitting: Psychiatry

## 2020-09-08 ENCOUNTER — Emergency Department
Admission: EM | Admit: 2020-09-08 | Discharge: 2020-09-11 | Disposition: A | Discharge status: Other Acute Inpatient Hospital | Payer: No Typology Code available for payment source | Attending: Emergency Medicine | Admitting: Emergency Medicine

## 2020-09-08 ENCOUNTER — Other Ambulatory Visit: Payer: Self-pay

## 2020-09-08 DIAGNOSIS — Z7982 Long term (current) use of aspirin: Secondary | ICD-10-CM | POA: Diagnosis not present

## 2020-09-08 DIAGNOSIS — F29 Unspecified psychosis not due to a substance or known physiological condition: Secondary | ICD-10-CM

## 2020-09-08 DIAGNOSIS — F1220 Cannabis dependence, uncomplicated: Secondary | ICD-10-CM

## 2020-09-08 DIAGNOSIS — Z20822 Contact with and (suspected) exposure to covid-19: Secondary | ICD-10-CM | POA: Insufficient documentation

## 2020-09-08 DIAGNOSIS — Z79899 Other long term (current) drug therapy: Secondary | ICD-10-CM | POA: Insufficient documentation

## 2020-09-08 DIAGNOSIS — F17200 Nicotine dependence, unspecified, uncomplicated: Secondary | ICD-10-CM | POA: Diagnosis present

## 2020-09-08 DIAGNOSIS — F250 Schizoaffective disorder, bipolar type: Secondary | ICD-10-CM | POA: Diagnosis not present

## 2020-09-08 DIAGNOSIS — F17210 Nicotine dependence, cigarettes, uncomplicated: Secondary | ICD-10-CM | POA: Diagnosis not present

## 2020-09-08 LAB — ACETAMINOPHEN LEVEL: Acetaminophen (Tylenol), Serum: <10 ug/mL — ABNORMAL LOW (ref 10–30)

## 2020-09-08 LAB — CBC WITH DIFFERENTIAL/PLATELET
Abs Immature Granulocytes: 0.03 10*3/uL (ref 0.00–0.07)
Basophils Absolute: 0 10*3/uL (ref 0.0–0.1)
Basophils Relative: 0 %
Eosinophils Absolute: 0.1 10*3/uL (ref 0.0–0.5)
Eosinophils Relative: 1 %
HCT: 38.1 % — ABNORMAL LOW (ref 39.0–52.0)
Hemoglobin: 12.3 g/dL — ABNORMAL LOW (ref 13.0–17.0)
Immature Granulocytes: 0 %
Lymphocytes Relative: 29 %
Lymphs Abs: 3 10*3/uL (ref 0.7–4.0)
MCH: 26.9 pg (ref 26.0–34.0)
MCHC: 32.3 g/dL (ref 30.0–36.0)
MCV: 83.2 fL (ref 80.0–100.0)
Monocytes Absolute: 1.1 10*3/uL — ABNORMAL HIGH (ref 0.1–1.0)
Monocytes Relative: 11 %
Neutro Abs: 5.9 10*3/uL (ref 1.7–7.7)
Neutrophils Relative %: 59 %
Platelets: 396 10*3/uL (ref 150–400)
RBC: 4.58 MIL/uL (ref 4.22–5.81)
RDW: 14.5 % (ref 11.5–15.5)
WBC: 10.2 10*3/uL (ref 4.0–10.5)
nRBC: 0 % (ref 0.0–0.2)

## 2020-09-08 LAB — COMPREHENSIVE METABOLIC PANEL
ALT: 23 U/L (ref 0–44)
AST: 39 U/L (ref 15–41)
Albumin: 4.2 g/dL (ref 3.5–5.0)
Alkaline Phosphatase: 127 U/L — ABNORMAL HIGH (ref 38–126)
Anion gap: 9 (ref 5–15)
BUN: 18 mg/dL (ref 6–20)
CO2: 24 mmol/L (ref 22–32)
Calcium: 9 mg/dL (ref 8.9–10.3)
Chloride: 104 mmol/L (ref 98–111)
Creatinine, Ser: 0.92 mg/dL (ref 0.61–1.24)
GFR, Estimated: >60 mL/min (ref >60)
Glucose, Bld: 142 mg/dL — ABNORMAL HIGH (ref 70–99)
Potassium: 3.3 mmol/L — ABNORMAL LOW (ref 3.5–5.1)
Sodium: 137 mmol/L (ref 135–145)
Total Bilirubin: 0.7 mg/dL (ref 0.3–1.2)
Total Protein: 7.3 g/dL (ref 6.5–8.1)

## 2020-09-08 LAB — ETHANOL: Alcohol, Ethyl (B): <10 mg/dL (ref <10)

## 2020-09-08 LAB — SALICYLATE LEVEL: Salicylate Lvl: <7 mg/dL — ABNORMAL LOW (ref 7.0–30.0)

## 2020-09-08 LAB — LITHIUM LEVEL: Lithium Lvl: 0.4 mmol/L — ABNORMAL LOW (ref 0.60–1.20)

## 2020-09-08 LAB — RESP PANEL BY RT-PCR (FLU A&B, COVID) ARPGX2
Influenza A by PCR: NEGATIVE
Influenza B by PCR: NEGATIVE
SARS Coronavirus 2 by RT PCR: NEGATIVE

## 2020-09-08 MED ORDER — ZIPRASIDONE MESYLATE 20 MG IM SOLR
20.0000 mg | Freq: Once | INTRAMUSCULAR | Status: AC
  Administered 2020-09-08: 20 mg via INTRAMUSCULAR

## 2020-09-08 NOTE — ED Notes (Signed)
Report received from Jessica, RN including situation, background, assessment and recommendations. Patient sleeping, respirations regular and unlabored. Q15 minute rounds and security camera observation to continue. Will assess patient once awake. 

## 2020-09-08 NOTE — ED Notes (Signed)
Hourly rounding completed at this time, patient currently asleep in room. No complaints, stable, and in no acute distress. Q15 minute rounds and monitoring via Rover and Officer to continue. 

## 2020-09-08 NOTE — ED Provider Notes (Signed)
[facility] Emergency Department Provider Note  ____________________________________________  Time seen: Approximately 5:20 PM  I have reviewed the triage vital signs and the nursing notes.   HISTORY  Chief Complaint Psychiatric Evaluation    Level 5 Caveat: Portions of the History and Physical including HPI and review of systems are unable to be completely obtained due to patient being a poor historian   HPI Ernest Cook is a 30 y.o. male with a history of schizoaffective disorder who was brought to the ED under involuntary commitment due to aggressive behavior, incoherent speech, bizarre behavior at home.  Patient states that he feels fine, not sure if he has been taking all of his medications.  IVC paperwork completed by his mother, who appears to believe that he has not been compliant with medications.  No SI or HI.      Past Medical History:  Diagnosis Date  . Schizo affective schizophrenia [facility])      Patient Active Problem List   Diagnosis Date Noted  . Closed displaced comminuted fracture of shaft of right humerus 06/26/2020  . Broken arm, right, closed, initial encounter 06/25/2020  . Noncompliance 03/31/2016  . Cannabis use disorder, moderate, dependence (HCC) 03/26/2016  . Tobacco use disorder 03/26/2016  . Schizoaffective disorder, bipolar type (HCC) 03/25/2016  . Involuntary commitment 03/24/2016     Past Surgical History:  Procedure Laterality Date  . ORIF HUMERUS FRACTURE Right 06/27/2020   Procedure: OPEN REDUCTION INTERNAL FIXATION (ORIF) HUMERAL SHAFT FRACTURE;  Surgeon: Roby Lofts, MD;  Location: [facility] OR;  Service: Orthopedics;  Laterality: Right;  . SKIN GRAFT Left unknown   Pt reports left foot and leg skin graft     Prior to Admission medications   Medication Sig Start Date End Date Taking? Authorizing Provider  aspirin EC 81 MG EC tablet Take 1 tablet (81 mg total) by mouth daily. Swallow whole. 08/02/20    Clapacs, Jackquline Denmark, MD  atomoxetine (STRATTERA) 40 MG capsule Take 1 capsule (40 mg total) by mouth every morning. 08/01/20   Clapacs, Jackquline Denmark, MD  benztropine (COGENTIN) 1 MG tablet Take 1 tablet (1 mg total) by mouth at bedtime. 08/01/20   Clapacs, Jackquline Denmark, MD  haloperidol (HALDOL) 10 MG tablet Take 10 mg by mouth at bedtime. 08/01/20   [provider]  lithium carbonate (ESKALITH) 450 MG CR tablet Take 1 tablet (450 mg total) by mouth every 12 (twelve) hours. 08/01/20   Clapacs, Jackquline Denmark, MD  methocarbamol (ROBAXIN) 500 MG tablet Take 1 tablet (500 mg total) by mouth every 6 (six) hours as needed for muscle spasms. 08/01/20   Clapacs, Jackquline Denmark, MD  risperiDONE (RISPERDAL) 3 MG tablet Take 1 tablet (3 mg total) by mouth at bedtime. 08/01/20   Clapacs, Jackquline Denmark, MD  risperiDONE microspheres (RISPERDAL CONSTA) 50 MG injection Inject 2 mLs (50 mg total) into the muscle every 14 (fourteen) days. 08/08/20   Clapacs, Jackquline Denmark, MD  Vitamin D3 (VITAMIN D) 25 MCG tablet Take 2 tablets (2,000 Units total) by mouth daily. 08/01/20 10/30/20  Clapacs, Jackquline Denmark, MD     Allergies Penicillins   Family History  Problem Relation Age of Onset  . Diabetes Mother   . Hypertension Mother     Social History Social History   Tobacco Use  . Smoking status: Current Every Day Smoker    Packs/day: 0.25    Types: Cigarettes  . Smokeless tobacco: Never Used  Substance Use Topics  . Alcohol  use: No  . Drug use: Yes    Types: Marijuana    Comment: "every now and again"    Review of Systems Level 5 Caveat: Portions of the History and Physical including HPI and review of systems are unable to be completely obtained due to patient being a poor historian   Constitutional:   No known fever.  ENT:   No rhinorrhea. Cardiovascular:   No chest pain or syncope. Respiratory:   No dyspnea or cough. Gastrointestinal:   Negative for abdominal pain, vomiting and diarrhea.  Musculoskeletal:   Negative for focal pain or  swelling ____________________________________________   PHYSICAL EXAM:  VITAL SIGNS: ED Triage Vitals  Enc Vitals Group     BP 09/08/20 1656 109/85     Pulse Rate 09/08/20 1656 (!) 106     Resp 09/08/20 1656 18     Temp 09/08/20 1656 97.6 F (36.4 C)     Temp Source 09/08/20 1656 Oral     SpO2 09/08/20 1656 100 %     Weight 09/08/20 1655 198 lb 6.6 oz (90 kg)     Height 09/08/20 1655 5\' 9"  (1.753 m)     Head Circumference --      Peak Flow --      Pain Score 09/08/20 1655 0     Pain Loc --      Pain Edu? --      Excl. in GC? --     Vital signs reviewed, nursing assessments reviewed.   Constitutional:   Alert and oriented to person and place. Non-toxic appearance. Eyes:   Conjunctivae are normal. EOMI. PERRL. ENT      Head:   Normocephalic and atraumatic.      Nose:   No congestion/rhinnorhea.         Neck:   No meningismus. Full ROM.  Cardiovascular:   RRR.  Normal radial pulse.  Cap refill less than 2 seconds. Respiratory:   Normal respiratory effort without tachypnea/retractions. Breath sounds are clear and equal bilaterally. No wheezes/rales/rhonchi. Musculoskeletal:   Normal range of motion in all extremities.  No deformities or point tenderness  neurologic:   Mumbling speech, incoherent. Disorganized thought..  Motor grossly intact. Skin:    Skin is warm, dry and intact. No rash noted.  No petechiae, purpura, or bullae.  ____________________________________________    LABS (pertinent positives/negatives) (all labs ordered are listed, but only abnormal results are displayed) Labs Reviewed  RESP PANEL BY RT-PCR (FLU A&B, COVID) ARPGX2  COMPREHENSIVE METABOLIC PANEL  CBC WITH DIFFERENTIAL/PLATELET  SALICYLATE LEVEL  ACETAMINOPHEN LEVEL  ETHANOL  LITHIUM LEVEL   ____________________________________________   EKG    ____________________________________________    RADIOLOGY  No results  found.  ____________________________________________   PROCEDURES Procedures  ____________________________________________    CLINICAL IMPRESSION / ASSESSMENT AND PLAN / ED COURSE  Medications ordered in the ED: Medications  ziprasidone (GEODON) injection 20 mg (20 mg Intramuscular Given 09/08/20 1710)    Pertinent labs & imaging results that were available during my care of the patient were reviewed by me and considered in my medical decision making (see chart for details).   Traci KENNIE SNEDDEN was evaluated in Emergency Department on 09/08/2020 for the symptoms described in the history of present illness. He was evaluated in the context of the global COVID-19 pandemic, which necessitated consideration that the patient might be at risk for infection with the SARS-CoV-2 virus that causes COVID-19. Institutional protocols and algorithms that pertain to the evaluation of patients at  risk for COVID-19 are in a state of rapid change based on information released by regulatory bodies including the CDC and federal and state organizations. These policies and algorithms were followed during the patient's care in the ED.   Patient presents with psychiatric decompensation.  Will check labs, lithium level, consult psychiatry, continue IVC for now.  Patient offered medications to help with his symptoms, agreeable to taking IM Geodon but not willing to take it by mouth.  The patient has been placed in psychiatric observation due to the need to provide a safe environment for the patient while obtaining psychiatric consultation and evaluation, as well as ongoing medical and medication management to treat the patient's condition.  The patient has been placed under full IVC at this time.       ____________________________________________   FINAL CLINICAL IMPRESSION(S) / ED DIAGNOSES    Final diagnoses:  Psychosis, unspecified psychosis type [facility])     ED Discharge Orders    None       Portions of this note were generated with dragon dictation software. Dictation errors may occur despite best attempts at proofreading.   Sharman Cheek, MD 09/08/20 1723

## 2020-09-08 NOTE — ED Notes (Signed)
Pt to triage in wheelchair, in forensic restraints accompanied by ACSO.  Pt mumbling incoherently, refusing vitals.  Taken back to room 20 with sherriff.  Remains in forensic restraints

## 2020-09-08 NOTE — ED Notes (Signed)
Pt now resting on bed in NAD. Lights turned off. Pt given blanket.

## 2020-09-08 NOTE — BH Assessment (Signed)
Patient currently sleeping at this time, unable to assess. This Clinical research associate will attempt at a later time.

## 2020-09-08 NOTE — ED Notes (Signed)
Pt belongings include black sweatshirt, green tank top, jeans, two socks, two shoes, a wig, a tobaggan, and a pair of underwear. 2/2 belongings bag.

## 2020-09-08 NOTE — ED Triage Notes (Signed)
Pt comes IVC under BPD from home. States not taking meds and psychotic. Pt in forensic restraints upon arrival. Delusional and psychotic statements from pt.

## 2020-09-08 NOTE — ED Notes (Signed)
Pt to be moved to ED BHU once he wakes up. Will continue to monitor.

## 2020-09-09 ENCOUNTER — Encounter: Payer: Self-pay | Admitting: Psychiatry

## 2020-09-09 DIAGNOSIS — F250 Schizoaffective disorder, bipolar type: Secondary | ICD-10-CM | POA: Diagnosis not present

## 2020-09-09 MED ORDER — RISPERIDONE 1 MG/ML PO SOLN
3.0000 mg | Freq: Every day | ORAL | Status: DC
Start: 2020-09-09 — End: 2020-09-11
  Administered 2020-09-10: 3 mg via ORAL
  Filled 2020-09-09 (×3): qty 3

## 2020-09-09 MED ORDER — LORAZEPAM 2 MG/ML IJ SOLN
2.0000 mg | Freq: Three times a day (TID) | INTRAMUSCULAR | Status: DC | PRN
  Administered 2020-09-09 (×2): 2 mg via INTRAMUSCULAR
  Filled 2020-09-09 (×2): qty 1

## 2020-09-09 MED ORDER — ZIPRASIDONE MESYLATE 20 MG IM SOLR: 20.0000 mg | Freq: Once | INTRAMUSCULAR | Status: DC

## 2020-09-09 MED ORDER — LITHIUM CITRATE 300 MG/5 ML PO SYRP
450.0000 mg | Freq: Two times a day (BID) | ORAL | Status: DC
  Administered 2020-09-09 – 2020-09-11 (×4): 450 mg via ORAL
  Filled 2020-09-09 (×6): qty 7.5

## 2020-09-09 MED ORDER — HALOPERIDOL 5 MG PO TABS
10.0000 mg | ORAL_TABLET | Freq: Three times a day (TID) | ORAL | Status: DC | PRN
  Administered 2020-09-10: 10 mg via ORAL
  Filled 2020-09-09: qty 2

## 2020-09-09 MED ORDER — DIPHENHYDRAMINE HCL 25 MG PO CAPS
50.0000 mg | ORAL_CAPSULE | Freq: Four times a day (QID) | ORAL | Status: DC | PRN
  Administered 2020-09-10: 50 mg via ORAL
  Filled 2020-09-09: qty 2

## 2020-09-09 MED ORDER — HALOPERIDOL LACTATE 2 MG/ML PO CONC
10.0000 mg | Freq: Two times a day (BID) | ORAL | Status: DC
Start: 2020-09-09 — End: 2020-09-11
  Administered 2020-09-09 – 2020-09-11 (×3): 10 mg via ORAL
  Filled 2020-09-09 (×6): qty 5

## 2020-09-09 MED ORDER — DROPERIDOL 2.5 MG/ML IJ SOLN
10.0000 mg | Freq: Once | INTRAMUSCULAR | Status: AC
  Administered 2020-09-09: 10 mg via INTRAMUSCULAR

## 2020-09-09 MED ORDER — DIPHENHYDRAMINE HCL 50 MG/ML IJ SOLN
25.0000 mg | INTRAMUSCULAR | Status: AC
  Administered 2020-09-09: 25 mg via INTRAMUSCULAR
  Filled 2020-09-09: qty 1

## 2020-09-09 MED ORDER — VITAMIN D3 25 MCG (1000 UNIT) PO TABS
2000.0000 [IU] | ORAL_TABLET | Freq: Every day | ORAL | Status: DC
  Administered 2020-09-11: 2000 [IU] via ORAL
  Filled 2020-09-09 (×3): qty 2

## 2020-09-09 MED ORDER — HALOPERIDOL LACTATE 5 MG/ML IJ SOLN
10.0000 mg | Freq: Three times a day (TID) | INTRAMUSCULAR | Status: DC | PRN
  Administered 2020-09-09 (×2): 10 mg via INTRAMUSCULAR
  Filled 2020-09-09 (×2): qty 2

## 2020-09-09 MED ORDER — BENZTROPINE MESYLATE 1 MG/ML IJ SOLN
2.0000 mg | Freq: Two times a day (BID) | INTRAMUSCULAR | Status: DC | PRN
  Filled 2020-09-09: qty 2

## 2020-09-09 MED ORDER — BENZTROPINE MESYLATE 1 MG PO TABS: 1.0000 mg | ORAL_TABLET | Freq: Two times a day (BID) | ORAL | Status: DC | PRN

## 2020-09-09 MED ORDER — HALOPERIDOL LACTATE 5 MG/ML IJ SOLN
5.0000 mg | Freq: Once | INTRAMUSCULAR | Status: AC
  Administered 2020-09-09: 5 mg via INTRAMUSCULAR
  Filled 2020-09-09: qty 1

## 2020-09-09 MED ORDER — MIDAZOLAM HCL 2 MG/2ML IJ SOLN
2.0000 mg | Freq: Once | INTRAMUSCULAR | Status: AC
  Administered 2020-09-09: 2 mg via INTRAMUSCULAR
  Filled 2020-09-09: qty 2

## 2020-09-09 MED ORDER — DIPHENHYDRAMINE HCL 50 MG/ML IJ SOLN
50.0000 mg | Freq: Four times a day (QID) | INTRAMUSCULAR | Status: DC | PRN
  Administered 2020-09-09 (×2): 50 mg via INTRAMUSCULAR
  Filled 2020-09-09 (×2): qty 1

## 2020-09-09 MED ORDER — LORAZEPAM 2 MG PO TABS
2.0000 mg | ORAL_TABLET | Freq: Three times a day (TID) | ORAL | Status: DC | PRN
  Administered 2020-09-10 – 2020-09-11 (×3): 2 mg via ORAL
  Filled 2020-09-09 (×3): qty 1
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
## 2020-09-09 NOTE — ED Notes (Addendum)
Pt was allowed to use the phone.  RN informed the patient was calling the Film/video editor phone repeatedly.  Security officer went to get the phone back.  Pt refused to hand over the phone.  Phone was unplugged.

## 2020-09-09 NOTE — ED Notes (Signed)
Security present in quad due to pt continuing yelling. Pt to be moved to BHU once rooms are prepared.

## 2020-09-09 NOTE — ED Notes (Signed)
Pt not given lunch tray d/t pt uncooperative and having to get medicated. Will attempt at a later time.

## 2020-09-09 NOTE — ED Notes (Signed)
Hourly rounding reveals patient in room. No complaints, stable, in no acute distress. Q15 minute rounds and monitoring via Security Cameras to continue. 

## 2020-09-09 NOTE — ED Notes (Signed)
Patient is agitated and don't want to be bothered. VS not taken, patient asleep.
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
## 2020-09-09 NOTE — ED Notes (Signed)
Hourly rounding completed at this time, patient currently asleep in room. No complaints, stable, and in no acute distress. Q15 minute rounds and monitoring via Rover and Officer to continue. 

## 2020-09-09 NOTE — Care Management (Signed)
Per Dr. Rebecca Eaton, patient meets criteria for inpatient psychiatric hospitalization.  Per [facility], there are no appropriate beds at [facility]  Writer referred patient to the following facilities:   [facility] Health Details  Fax        [address] [place] [address]    Internal comment    [facility] Details  Fax        [address], [place] [address]    Internal comment    [facility] Details  Fax        [address] [place] [address]    Internal comment    [facility] Details  Fax        [address], [place] [address]    Internal comment    [facility] Details  Fax        [address] [place] [address]    Internal comment    [facility] Details  Fax        [address] [place], [place] [address]    Internal comment    [facility] Details  Fax        [address] [place] [address]    Internal comment    [facility] Details  Fax        [address] [place], [place] [address]    Internal comment    [facility] Details  [facility] [address] [address] [place] [address]    Internal comment    [facility] Details  Fax        [address] [place] [address]    Internal comment    [facility] Details  Fax        [address], [place] [address]    Internal comment    [facility] Details  Fax        [address] [place], [place] [address]    Internal comment    [facility] Details  Fax        [address], [place] [address]    Internal comment    [facility] Details  Fax        [address], [place] [address]    Internal comment    [facility] Details  Fax        [address] [place] [address]    Internal comment    [facility] Details  Fax        [phone]. [address] [place] [address]    Internal comment    [facility] Details  Fax        [address] [place] [address]    Internal comment    [facility] Details  Fax        [address], [place] [address]    Internal comment    [facility] Health Details  Fax        [address], [place] [address]    Internal comment    [facility] Details  Fax        [address] [place] [address]    Internal comment    [facility] Details  Fax  [address] [place] [address]    Internal comment    [facility] Details  Fax        [address] [name]., [place] [address]    Internal comment    [facility] Details  Fax        [address] [place] [address]    Internal comment    [facility] Details  Fax        [address] [place] [address]    Internal comment    [facility] Details  Fax        [address] [place] [address]    Internal comment    [facility] Details  Fax        [address] [place] [address]    Internal comment    [facility] Details  Fax        [address], [place] [address]    Internal comment    [facility] Office Details  Fax        [address], [place] [address]    Internal comment    [facility] Details  Fax        [address] [place] [address]    Internal comment    [facility] Details  Fax        [address] [place], [place] [address]    Internal comment    [facility] Health Details  Fax        [address] [place] [address]    Internal comment    [facility] Healthcare Details   Fax        [address] [place] [address]    Internal comment

## 2020-09-09 NOTE — ED Notes (Signed)
Pt  Up to bathroom, breakfast tray delivered.  Pt sitting in room eating asking for phone.  Was told 9am is phone time

## 2020-09-09 NOTE — ED Notes (Addendum)
Pt continues to bang on window and door. Pt's speech is unintelligible.

## 2020-09-09 NOTE — ED Notes (Signed)
Phone retrieved from patient by Engineer, materials.

## 2020-09-09 NOTE — ED Provider Notes (Signed)
Emergency Medicine Observation Re-evaluation Note  Ernest Cook is a 30 y.o. male, seen on rounds today.  Pt initially presented to the ED for complaints of Psychiatric Evaluation   Physical Exam  BP 109/85   Pulse (!) 106   Temp 97.6 F (36.4 C) (Oral)   Resp 18   Ht 1.753 m (5\' 9" )   Wt 90 kg   SpO2 100%   BMI 29.30 kg/m  Physical Exam General: Aggressive behavior, yelling Lungs: No respiratory distress Psych: As above  ED Course / MDM  EKG:    I have reviewed the labs performed to date as well as medications administered while in observation.  Recent changes in the last 24 hours include patient with aggressive incidents, witnessed by psychiatrist, she ordered medications for patient and staff safety.  Plan  Current plan is for careful monitoring of this aggressive may require additional medication. Patient is under full IVC at this time.   , MD 09/09/20 [phone]

## 2020-09-09 NOTE — ED Notes (Signed)
Pt. Transferred to BHU from ED to room 6 after screening for contraband. Report to include Situation, Background, Assessment and Recommendations from [facility]. Pt. Oriented to unit including Q15 minute rounds as well as the security cameras for their protection. Patient is alert and oriented, warm and dry in no acute distress. Patient denies SI, HI, and AVH. Pt. Encouraged to let me know if needs arise.

## 2020-09-09 NOTE — ED Notes (Signed)
Snack and beverage given. 

## 2020-09-09 NOTE — ED Notes (Signed)
Pt given drink and sandwich tray with his medications.

## 2020-09-09 NOTE — ED Notes (Signed)
Pt given dinner tray. Pt eating.

## 2020-09-09 NOTE — ED Notes (Signed)
Patient went to his room and lied down by the time forced medication drawn. Hold medications and pass it down to oncoming nurse. Charge nurse aware.

## 2020-09-09 NOTE — ED Notes (Signed)
Pt awake in room now, pt is yelling but remaining in bed. Unable to determine what pt is saying at this time. Dr. York Cerise notified.

## 2020-09-09 NOTE — ED Notes (Signed)
Pt has continued to yell aggressively in room but is not getting out of bed. Security remains in area for safety, will move pt to BHU once room is clean

## 2020-09-09 NOTE — ED Notes (Addendum)
Patient is acting out, banging on the glass door and disrupting the unit. EDP notified.

## 2020-09-09 NOTE — ED Notes (Signed)
Pt given lunch tray.

## 2020-09-09 NOTE — ED Notes (Addendum)
Pt banging on window and punching walls.  Dr. Viviano Simas made aware and assessed pt.  IM mediations ordered. Security had to hold patient for medications to be given.  EDP and charge nurse made aware.

## 2020-09-09 NOTE — ED Notes (Signed)
RN called pharmacy asking if medications could be sent due to patient's escalating behavior.

## 2020-09-09 NOTE — ED Notes (Signed)
RN and Engineer, materials went to see what pt needed after banging on the window.  Pt speaking softly and speech is unintelligible.

## 2020-09-09 NOTE — ED Notes (Signed)
Pt sleeping. VS will be taken once awake. 

## 2020-09-09 NOTE — ED Notes (Signed)
Pt knocking / banging on window every couple of minutes.  Pt is unable to clearly state what he needs.  Speech is mumbled.

## 2020-09-09 NOTE — ED Notes (Signed)
Patient is IVC pending psych consult 

## 2020-09-09 NOTE — ED Notes (Signed)
Patient knocking on the window and pressured talking. Staff unable to understand what he saying. Snack and beverage given.Staff took out trash. Patient is manic and displaying erratic behavior. Encouraged patient to eat his snack. Continue to monitor.

## 2020-09-09 NOTE — BH Assessment (Signed)
Patient continues to be unable to assess, hopefully patient will be more alert, oriented and cooperative during the day for his assessment

## 2020-09-09 NOTE — ED Notes (Signed)
Pt agitated. Unable to get VS at this time.

## 2020-09-09 NOTE — ED Notes (Signed)
Report to include Situation, Background, Assessment, and Recommendations received from Amy RN. Patient alert and oriented, warm and dry, in no acute distress. UTA SI, HI, AVH and pain due to patient not able to speak clearly. Patient made aware of Q15 minute rounds and security cameras for their safety. Patient instructed to come to me with needs or concerns.

## 2020-09-09 NOTE — ED Notes (Signed)
Patient is easily angered when told phone hours are over, unable to follow directions when told not  to knock the door so many times. Displayed aggressive manner, became overly loud, shout at when talking, banging windows and walls disrupting the unit. Patient refused to listen to this Clinical research associate and officers.

## 2020-09-10 DIAGNOSIS — F250 Schizoaffective disorder, bipolar type: Secondary | ICD-10-CM

## 2020-09-10 NOTE — ED Notes (Signed)
Patient ate 100% of supper and beverage.  

## 2020-09-10 NOTE — ED Notes (Signed)
Hourly rounding reveals patient in room. No complaints, stable, in no acute distress. Q15 minute rounds and monitoring via Security Cameras to continue. 

## 2020-09-10 NOTE — ED Notes (Signed)
Patient refused vs at this time. 

## 2020-09-10 NOTE — ED Notes (Signed)
VS not taken patient continues to be uncooperative.

## 2020-09-10 NOTE — ED Notes (Signed)
Patient knocking on the door and when asked what he wants he said "out". This Clinical research associate explained to him that he needs to see the psychiatric for reassessment. Patient continues to knock on the window and officer redirect the patient. Will continue to monitor.

## 2020-09-10 NOTE — ED Notes (Signed)
Report to include situation, background, assessment and recommendations from Wendy RN. Patient sleeping, respirations regular and unlabored. Q15 minute rounds and security camera observation to continue.    

## 2020-09-10 NOTE — ED Notes (Signed)
Patient woke up and asked for something to drink and blanket. Patient then continues to come to the door for no definitive reason. Officer and this Clinical research associate encouraged patient to go to sleep. Patient went to his room and laid down in his bed. Will continue to monitor.

## 2020-09-10 NOTE — ED Notes (Signed)
Patient agitated because He wants to stay on the phone and nurse told him phone had to go back on the charger, He also yelling ' I need to get out of here" but then started with word salad, security guard ask for the phone and He begrudgingly handed back to guard and then hit the door with His fist, Nurse will continue to monitor for agitation, and will medicate if He becomes combative or continues to yell.

## 2020-09-10 NOTE — ED Notes (Signed)
Patient is talking on the phone with His Mom at this time, no yelling or agitation noted, will continue to monitor.

## 2020-09-10 NOTE — ED Notes (Signed)
Patient had medication put in His drink, He will not take if you show medication to him at this time, Patient will bang on window and doors on and off, but can be redirected, Nurse will continue to monitor, camera surveillance in progress for safety. Patient will talk gibberish at times and have some sensible words. Patient wants to call His mom at 1 pm. Nurse let him know that He could use the phone at that time.

## 2020-09-10 NOTE — BH Assessment (Addendum)
Referral information for Psychiatric Hospitalization faxed to;   [name] [phone]),   . [name] ([phone]),  . [facility] [phone]),   . [name] [phone] -or- [phone]),   . [facility] [phone]).  [facility] [phone])

## 2020-09-10 NOTE — ED Notes (Signed)
IVC pending placement 

## 2020-09-10 NOTE — ED Notes (Signed)
VS not taken, patient very agitated

## 2020-09-10 NOTE — ED Provider Notes (Signed)
Emergency Medicine Observation Re-evaluation Note  Ernest Cook is a 30 y.o. male, seen on rounds today.  Pt initially presented to the ED for complaints of Psychiatric Evaluation Currently, the patient is resting.  Physical Exam  BP 109/85   Pulse (!) 106   Temp 97.6 F (36.4 C) (Oral)   Resp 18   Ht 5\' 9"  (1.753 m)   Wt 90 kg   SpO2 100%   BMI 29.30 kg/m  Physical Exam General: resting Cardiac: well perfused Lungs: even and unlabored respirations Psych: persistent episodes of agitation throughout the night  ED Course / MDM  EKG:    I have reviewed the labs performed to date as well as medications administered while in observation.  Recent changes in the last 24 hours include none.  Plan  Current plan is for psych eval and dispo. Patient is under full IVC at this time.   , MD 09/10/20 [phone]

## 2020-09-10 NOTE — ED Notes (Signed)
Breakfast tray given and beverage, nurse crushed some of His morning meds and put in OJ, patient did drink, He bangs on window at times, security advised him to stop if He wanted to use the phone. Patient did go back to His room, will continue to monitor, camera surveillance in progress for safety. Patient did eat 100% of breakfast and beverage.

## 2020-09-10 NOTE — Consult Note (Signed)
[facility] Face-to-Face Psychiatry Consult   Reason for Consult: Follow-up consult for this gentleman with schizoaffective disorder who is back in the hospital again Referring Physician: Roxan Hockey Patient Identification: Ernest Cook MRN:  [id number] Principal Diagnosis: Schizoaffective disorder, bipolar type (HCC) Diagnosis:  Principal Problem:   Schizoaffective disorder, bipolar type (HCC) Active Problems:   Cannabis use disorder, moderate, dependence (HCC)   Tobacco use disorder   Total Time spent with patient: 30 minutes  Subjective:   Ernest Cook is a 30 y.o. male patient admitted with patient was not able to give a coherent answer.  HPI: 30 year old man with schizoaffective disorder who has been seen multiple times here who is back in the hospital once again after an episode in which he was belligerently trying to demand free cigarettes from service stations and acting bizarre in public.  Was belligerent when he came into the emergency room.  On interview with me today I found the patient very disorganized.  I could not make any sense of what he was saying.  Did not seem to be oversedated but he had just woken up.  Has not been violent or threatening today but still has intermittently explosive temper.  Labs largely unremarkable.  I got a call today from DSS in his home county expressing the concern that perhaps he needed to be worked up for "something else" or a head injury causing his problems  Past Psychiatric History: Long history of chronic mental illness  Risk to Self:   Risk to Others:   Prior Inpatient Therapy:   Prior Outpatient Therapy:    Past Medical History:  Past Medical History:  Diagnosis Date  . Schizo affective schizophrenia [facility])     Past Surgical History:  Procedure Laterality Date  . ORIF HUMERUS FRACTURE Right 06/27/2020   Procedure: OPEN REDUCTION INTERNAL FIXATION (ORIF) HUMERAL SHAFT FRACTURE;  Surgeon: Roby Lofts, MD;  Location: [facility] OR;  Service:  Orthopedics;  Laterality: Right;  . SKIN GRAFT Left unknown   Pt reports left foot and leg skin graft   Family History:  Family History  Problem Relation Age of Onset  . Diabetes Mother   . Hypertension Mother    Family Psychiatric  History: See previous Social History:  Social History   Substance and Sexual Activity  Alcohol Use No     Social History   Substance and Sexual Activity  Drug Use Yes  . Types: Marijuana   Comment: "every now and again"    Social History   Socioeconomic History  . Marital status: Single    Spouse name: Not on file  . Number of children: Not on file  . Years of education: Not on file  . Highest education level: Not on file  Occupational History  . Not on file  Tobacco Use  . Smoking status: Current Every Day Smoker    Packs/day: 0.25    Types: Cigarettes  . Smokeless tobacco: Never Used  Substance and Sexual Activity  . Alcohol use: No  . Drug use: Yes    Types: Marijuana    Comment: "every now and again"  . Sexual activity: Yes    Birth control/protection: Condom    Comment: Pt reports he is attracted to men  Other Topics Concern  . Not on file  Social History Narrative  . Not on file   Social Determinants of Health   Financial Resource Strain: Not on file  Food Insecurity: Not on file  Transportation Needs: Not on file  Physical Activity: Not on file  Stress: Not on file  Social Connections: Not on file   Additional Social History:    Allergies:   Allergies  Allergen Reactions  . Penicillins Other (See Comments)    Seizure when he was 19    Labs:  Results for orders placed or performed during the hospital encounter of 09/08/20 (from the past 48 hour(s))  Comprehensive metabolic panel     Status: Abnormal   Collection Time: 09/08/20  6:16 PM  Result Value Ref Range   Sodium 137 135 - 145 mmol/L   Potassium 3.3 (L) 3.5 - 5.1 mmol/L   Chloride 104 98 - 111 mmol/L   CO2 24 22 - 32 mmol/L   Glucose, Bld 142 (H)  70 - 99 mg/dL    Comment: Glucose reference range applies only to samples taken after fasting for at least 8 hours.   BUN 18 6 - 20 mg/dL   Creatinine, Ser 1.610.92 0.61 - 1.24 mg/dL   Calcium 9.0 8.9 - 09.610.3 mg/dL   Total Protein 7.3 6.5 - 8.1 g/dL   Albumin 4.2 3.5 - 5.0 g/dL   AST 39 15 - 41 U/L   ALT 23 0 - 44 U/L   Alkaline Phosphatase 127 (H) 38 - 126 U/L   Total Bilirubin 0.7 0.3 - 1.2 mg/dL   GFR, Estimated >04>60 >54>60 mL/min    Comment: (NOTE) Calculated using the CKD-EPI Creatinine Equation (2021)    Anion gap 9 5 - 15    Comment: Performed at [facility], [address] [place], [place] [address]  CBC with Differential     Status: Abnormal   Collection Time: 09/08/20  6:16 PM  Result Value Ref Range   WBC 10.2 4.0 - 10.5 K/uL   RBC 4.58 4.22 - 5.81 MIL/uL   Hemoglobin 12.3 (L) 13.0 - 17.0 g/dL   HCT 91.438.1 (L) 78.239.0 - 95.652.0 %   MCV 83.2 80.0 - 100.0 fL   MCH 26.9 26.0 - 34.0 pg   MCHC 32.3 30.0 - 36.0 g/dL   RDW 21.314.5 08.611.5 - 57.815.5 %   Platelets 396 150 - 400 K/uL   nRBC 0.0 0.0 - 0.2 %   Neutrophils Relative % 59 %   Neutro Abs 5.9 1.7 - 7.7 K/uL   Lymphocytes Relative 29 %   Lymphs Abs 3.0 0.7 - 4.0 K/uL   Monocytes Relative 11 %   Monocytes Absolute 1.1 (H) 0.1 - 1.0 K/uL   Eosinophils Relative 1 %   Eosinophils Absolute 0.1 0.0 - 0.5 K/uL   Basophils Relative 0 %   Basophils Absolute 0.0 0.0 - 0.1 K/uL   Immature Granulocytes 0 %   Abs Immature Granulocytes 0.03 0.00 - 0.07 K/uL    Comment: Performed at [name], [address] [place], [place] [address]  Salicylate level     Status: Abnormal   Collection Time: 09/08/20  6:16 PM  Result Value Ref Range   Salicylate Lvl <7.0 (L) 7.0 - 30.0 mg/dL    Comment: Performed at [facility], [address] [place], [place] [address]  Acetaminophen level     Status: Abnormal   Collection Time: 09/08/20  6:16 PM  Result Value Ref Range   Acetaminophen (Tylenol), Serum <10 (L) 10 - 30 ug/mL     Comment: (NOTE) Therapeutic concentrations vary significantly. A range of 10-30 ug/mL  may be an effective concentration for many patients. However, some  are best treated at  concentrations outside of this range. Acetaminophen concentrations >150 ug/mL at 4 hours after ingestion  and >50 ug/mL at 12 hours after ingestion are often associated with  toxic reactions.  Performed at [facility], [address] [place], [place] [address]   Ethanol     Status: None   Collection Time: 09/08/20  6:16 PM  Result Value Ref Range   Alcohol, Ethyl (B) <10 <10 mg/dL    Comment: (NOTE) Lowest detectable limit for serum alcohol is 10 mg/dL.  For medical purposes only. Performed at [facility], [address] [place], [place] [address]   Lithium level     Status: Abnormal   Collection Time: 09/08/20  6:16 PM  Result Value Ref Range   Lithium Lvl 0.40 (L) 0.60 - 1.20 mmol/L    Comment: Performed at [facility], [address], [place], [place] [address]  Resp Panel by RT-PCR (Flu A&B, Covid) Nasopharyngeal Swab     Status: None   Collection Time: 09/08/20  6:16 PM   Specimen: Nasopharyngeal Swab; Nasopharyngeal(NP) swabs in vial transport medium  Result Value Ref Range   SARS Coronavirus 2 by RT PCR NEGATIVE NEGATIVE    Comment: (NOTE) SARS-CoV-2 target nucleic acids are NOT DETECTED.  The SARS-CoV-2 RNA is generally detectable in upper respiratory specimens during the acute phase of infection. The lowest concentration of SARS-CoV-2 viral copies this assay can detect is 138 copies/mL. A negative result does not preclude SARS-Cov-2 infection and should not be used as the sole basis for treatment or other patient management decisions. A negative result may occur with  improper specimen collection/handling, submission of specimen other than nasopharyngeal swab, presence of viral mutation(s) within the areas targeted by this assay, and inadequate number of  viral copies(<138 copies/mL). A negative result must be combined with clinical observations, patient history, and epidemiological information. The expected result is Negative.  Fact Sheet for Patients:  BloggerCourse.com  Fact Sheet for Healthcare Providers:  SeriousBroker.it  This test is no t yet approved or cleared by the [place] FDA and  has been authorized for detection and/or diagnosis of SARS-CoV-2 by FDA under an Emergency Use Authorization (EUA). This EUA will remain  in effect (meaning this test can be used) for the duration of the COVID-19 declaration under Section 564(b)(1) of the Act, 21 U.S.C.section 360bbb-3(b)(1), unless the authorization is terminated  or revoked sooner.       Influenza A by PCR NEGATIVE NEGATIVE   Influenza B by PCR NEGATIVE NEGATIVE    Comment: (NOTE) The Xpert Xpress SARS-CoV-2/FLU/RSV plus assay is intended as an aid in the diagnosis of influenza from Nasopharyngeal swab specimens and should not be used as a sole basis for treatment. Nasal washings and aspirates are unacceptable for Xpert Xpress SARS-CoV-2/FLU/RSV testing.  Fact Sheet for Patients: BloggerCourse.com  Fact Sheet for Healthcare Providers: SeriousBroker.it  This test is not yet approved or cleared by the [place] FDA and has been authorized for detection and/or diagnosis of SARS-CoV-2 by FDA under an Emergency Use Authorization (EUA). This EUA will remain in effect (meaning this test can be used) for the duration of the COVID-19 declaration under Section 564(b)(1) of the Act, 21 U.S.C. section 360bbb-3(b)(1), unless the authorization is terminated or revoked.  Performed at [facility], [address], [place], [place] [address]     Current Facility-Administered Medications  Medication Dose Route Frequency Provider Last Rate Last Admin  .  benztropine (COGENTIN) tablet 1 mg  1  mg Oral BID PRN Mariel CraftMaurer, Sheila M, MD       Or  . benztropine mesylate (COGENTIN) injection 2 mg  2 mg Intramuscular BID PRN Mariel CraftMaurer, Sheila M, MD      . cholecalciferol (VITAMIN D) tablet 2,000 Units  2,000 Units Oral Daily Mariel CraftMaurer, Sheila M, MD      . diphenhydrAMINE (BENADRYL) capsule 50 mg  50 mg Oral Q6H PRN Mariel CraftMaurer, Sheila M, MD   50 mg at 09/10/20 0746   Or  . diphenhydrAMINE (BENADRYL) injection 50 mg  50 mg Intramuscular Q6H PRN Mariel CraftMaurer, Sheila M, MD   50 mg at 09/09/20 2039  . haloperidol (HALDOL) 2 MG/ML solution 10 mg  10 mg Oral BID Mariel CraftMaurer, Sheila M, MD   10 mg at 09/09/20 1013  . haloperidol (HALDOL) tablet 10 mg  10 mg Oral Q8H PRN Mariel CraftMaurer, Sheila M, MD   10 mg at 09/10/20 [id number]   Or  . haloperidol lactate (HALDOL) injection 10 mg  10 mg Intramuscular Q8H PRN Mariel CraftMaurer, Sheila M, MD   10 mg at 09/09/20 2038  . lithium citrate 300 MG/5ML solution 450 mg  450 mg Oral Q12H Mariel CraftMaurer, Sheila M, MD   450 mg at 09/10/20 1120  . LORazepam (ATIVAN) tablet 2 mg  2 mg Oral Q8H PRN Mariel CraftMaurer, Sheila M, MD   2 mg at 09/10/20 1634   Or  . LORazepam (ATIVAN) injection 2 mg  2 mg Intramuscular Q8H PRN Mariel CraftMaurer, Sheila M, MD   2 mg at 09/09/20 2039  . risperiDONE (RISPERDAL) 1 MG/ML oral solution 3 mg  3 mg Oral QHS Mariel CraftMaurer, Sheila M, MD      . ziprasidone (GEODON) injection 20 mg  20 mg Intramuscular Once Mariel CraftMaurer, Sheila M, MD       Current Outpatient Medications  Medication Sig Dispense Refill  . aspirin EC 81 MG EC tablet Take 1 tablet (81 mg total) by mouth daily. Swallow whole. 30 tablet 1  . atomoxetine (STRATTERA) 40 MG capsule Take 1 capsule (40 mg total) by mouth every morning. 30 capsule 1  . benztropine (COGENTIN) 1 MG tablet Take 1 tablet (1 mg total) by mouth at bedtime. 30 tablet 1  . haloperidol (HALDOL) 10 MG tablet Take 10 mg by mouth at bedtime.    Marland Kitchen. lithium carbonate (ESKALITH) 450 MG CR tablet Take 1 tablet (450 mg total) by mouth every 12 (twelve) hours.  60 tablet 1  . methocarbamol (ROBAXIN) 500 MG tablet Take 1 tablet (500 mg total) by mouth every 6 (six) hours as needed for muscle spasms. 60 tablet 1  . risperiDONE (RISPERDAL) 3 MG tablet Take 1 tablet (3 mg total) by mouth at bedtime. 30 tablet 1  . risperiDONE microspheres (RISPERDAL CONSTA) 50 MG injection Inject 2 mLs (50 mg total) into the muscle every 14 (fourteen) days. 1 each 1  . Vitamin D3 (VITAMIN D) 25 MCG tablet Take 2 tablets (2,000 Units total) by mouth daily. 60 tablet 1    Musculoskeletal: Strength & Muscle Tone: flaccid Gait & Station: normal Patient leans: N/A  Psychiatric Specialty Exam: Physical Exam Vitals and nursing note reviewed.  Constitutional:      Appearance: He is well-developed and well-nourished.  HENT:     Head: Normocephalic and atraumatic.  Eyes:     Conjunctiva/sclera: Conjunctivae normal.     Pupils: Pupils are equal, round, and reactive to light.  Cardiovascular:     Heart sounds: Normal heart sounds.  Pulmonary:  Effort: Pulmonary effort is normal.  Abdominal:     Palpations: Abdomen is soft.  Musculoskeletal:        General: Normal range of motion.     Cervical back: Normal range of motion.  Skin:    General: Skin is warm and dry.  Neurological:     General: No focal deficit present.     Mental Status: He is alert.  Psychiatric:        Attention and Perception: He is inattentive.        Mood and Affect: Affect is blunt.        Speech: He is noncommunicative. Speech is delayed.        Behavior: Behavior is withdrawn.        Thought Content: Thought content is delusional. Thought content does not include homicidal or suicidal ideation.        Cognition and Memory: Cognition is impaired. Memory is impaired.        Judgment: Judgment is impulsive.     Review of Systems  Constitutional: Negative.   HENT: Negative.   Eyes: Negative.   Respiratory: Negative.   Cardiovascular: Negative.   Gastrointestinal: Negative.    Musculoskeletal: Negative.   Skin: Negative.   Neurological: Negative.   Psychiatric/Behavioral: Positive for decreased concentration and dysphoric mood.    Blood pressure 109/85, pulse (!) 106, temperature 97.6 F (36.4 C), temperature source Oral, resp. rate 18, height 5\' 9"  (1.753 m), weight 90 kg, SpO2 100 %.Body mass index is 29.3 kg/m.  General Appearance: Casual  Eye Contact:  Minimal  Speech:  Slow  Volume:  Decreased  Mood:  Dysphoric  Affect:  Blunt  Thought Process:  Disorganized  Orientation:  Full (Time, Place, and Person)  Thought Content:  Illogical  Suicidal Thoughts:  No  Homicidal Thoughts:  No  Memory:  Immediate;   Fair Recent;   Fair Remote;   Fair  Judgement:  Fair  Insight:  Fair  Psychomotor Activity:  Normal  Concentration:  Concentration: Fair  Recall:  of Knowledge:  Fair  Language:  Fair  Akathisia:  No  Handed:  Right  AIMS (if indicated):     Assets:  Resilience  ADL's:  Impaired  Cognition:  Impaired,  Mild  Sleep:        Treatment Plan Summary: Plan Medications have been restarted consistent with what he was taking before.  Our plan has largely been to keep him in the psychiatric emergency room [facility] how likely it is it that is going to happen is unclear.  We will reassess and see if it would be possible to admit him to the inpatient unit.  Medicines reviewed.  No change to medication today."  Agreement with what the people at DSS requested if he is behaving well and could be compliant tomorrow we may check an MRI.  Disposition: Recommend psychiatric Inpatient admission when medically cleared.  [employer], MD 09/10/2020 5:10 PM

## 2020-09-11 NOTE — ED Notes (Signed)
Attempted to call [facility] for report.  No answer.

## 2020-09-11 NOTE — ED Provider Notes (Signed)
Emergency Medicine Observation Re-evaluation Note  Ernest Cook is a 30 y.o. male, seen on rounds today.    Physical Exam  BP 130/74 (BP Location: Right Arm)   Pulse 90   Temp 98.6 F (37 C) (Oral)   Resp 18   Ht 5\' 9"  (1.753 m)   Wt 90 kg   SpO2 100%   BMI 29.30 kg/m  Physical Exam General: Patient in hallway talking to other patients Lungs: Patient in no respiratory distress Psych: Patient is not combative  ED Course / MDM  EKG:     Plan: Patient is under IVC waiting psych disposition   , MD 09/11/20 [phone]

## 2020-09-11 NOTE — ED Notes (Signed)
Hourly rounding reveals patient in room. No complaints, stable, in no acute distress. Q15 minute rounds and monitoring via Security Cameras to continue. 

## 2020-09-11 NOTE — ED Notes (Signed)
Administered medication at this time when patient asked for food and beverage.

## 2020-09-11 NOTE — ED Notes (Signed)
COUNTY  SHERIFF  DEPT  CALLED  FOR  TRANSPORT TO  [facility] 

## 2020-09-11 NOTE — ED Notes (Signed)
Awake, alert, calm cooperative.  NAD.

## 2020-09-11 NOTE — BH Assessment (Signed)
Referral Check;    Ernest Cook ([phone]- [phone]), No answer   Ernest Cook ([phone]), No answer at any 3 numbers   [facility] [phone]), No answer   [name] [phone] -[phone]), Staff reports to have Psychiatrist reassess him today 09/11/20 and provide updated reassess note in the referral   Ernest Cook [phone]). No answer, voicemail left   [facility] [phone]) Denied due to violence and aggression

## 2020-09-11 NOTE — BH Assessment (Addendum)
Patient has been accepted to [facility]). Accepting physician is Dr. Estill Cotta.  Call report to [phone].  Representative was [employer].   ER Staff is aware of it:  Misty Stanley, ER Secretary  Dr. Darnelle Catalan, ER MD  Erskine Squibb, Patient's Nurse     Patient's Family/Support System (Mom, [place] [phone]) has been updated as well.

## 2020-09-11 NOTE — ED Notes (Signed)
VS not taken, patient asleep 

## 2020-09-11 NOTE — BH Assessment (Addendum)
Referral Check;   [name] ([phone]- Per Jodene Nam, no beds available at this time.   [name] ([phone]---[phone]), Per Sharyl Nimrod, info not received. Will re-fax. Task completed at 11:00am   [facility] [phone]) [phone])- Per Jodene Nam, info not received. Will re-fax. Task completed at 11:00am    [name] [phone] -or- [phone]), Per Lamount Cranker, patient was denied due to chronicity   [facility] [phone]). No answer   [facility]([phone]) Denied due to violence and aggression

## 2020-09-11 NOTE — ED Notes (Signed)
Gave lunch tray with sprite.

## 2020-09-11 NOTE — ED Notes (Signed)
Patient woke up and came to the door knocking appropriately and asked for snack. Patient compliant with getting his Vital signs taken. Will continue to monitor.

## 2020-09-26 ENCOUNTER — Other Ambulatory Visit: Payer: Self-pay | Admitting: Psychiatry

## 2020-10-03 ENCOUNTER — Other Ambulatory Visit: Payer: Self-pay

## 2020-10-03 ENCOUNTER — Encounter: Payer: Self-pay | Admitting: Emergency Medicine

## 2020-10-03 ENCOUNTER — Emergency Department
Admission: EM | Admit: 2020-10-03 | Discharge: 2020-10-11 | Disposition: A | Discharge status: Home / Self Care | Payer: No Typology Code available for payment source | Attending: Emergency Medicine | Admitting: Emergency Medicine

## 2020-10-03 DIAGNOSIS — Z20822 Contact with and (suspected) exposure to covid-19: Secondary | ICD-10-CM | POA: Insufficient documentation

## 2020-10-03 DIAGNOSIS — F250 Schizoaffective disorder, bipolar type: Secondary | ICD-10-CM | POA: Insufficient documentation

## 2020-10-03 DIAGNOSIS — F17210 Nicotine dependence, cigarettes, uncomplicated: Secondary | ICD-10-CM | POA: Insufficient documentation

## 2020-10-03 DIAGNOSIS — Z046 Encounter for general psychiatric examination, requested by authority: Secondary | ICD-10-CM | POA: Diagnosis present

## 2020-10-03 DIAGNOSIS — R4689 Other symptoms and signs involving appearance and behavior: Secondary | ICD-10-CM

## 2020-10-03 DIAGNOSIS — F17200 Nicotine dependence, unspecified, uncomplicated: Secondary | ICD-10-CM | POA: Diagnosis present

## 2020-10-03 DIAGNOSIS — F1220 Cannabis dependence, uncomplicated: Secondary | ICD-10-CM | POA: Diagnosis present

## 2020-10-03 DIAGNOSIS — Z79899 Other long term (current) drug therapy: Secondary | ICD-10-CM | POA: Insufficient documentation

## 2020-10-03 LAB — ACETAMINOPHEN LEVEL: Acetaminophen (Tylenol), Serum: <10 ug/mL — ABNORMAL LOW (ref 10–30)

## 2020-10-03 LAB — CBC
HCT: 37.4 % — ABNORMAL LOW (ref 39.0–52.0)
Hemoglobin: 11.9 g/dL — ABNORMAL LOW (ref 13.0–17.0)
MCH: 26.7 pg (ref 26.0–34.0)
MCHC: 31.8 g/dL (ref 30.0–36.0)
MCV: 83.9 fL (ref 80.0–100.0)
Platelets: 338 10*3/uL (ref 150–400)
RBC: 4.46 MIL/uL (ref 4.22–5.81)
RDW: 14.4 % (ref 11.5–15.5)
WBC: 8.7 10*3/uL (ref 4.0–10.5)
nRBC: 0 % (ref 0.0–0.2)

## 2020-10-03 LAB — LITHIUM LEVEL: Lithium Lvl: 0.91 mmol/L (ref 0.60–1.20)

## 2020-10-03 LAB — COMPREHENSIVE METABOLIC PANEL
ALT: 23 U/L (ref 0–44)
AST: 24 U/L (ref 15–41)
Albumin: 3.9 g/dL (ref 3.5–5.0)
Alkaline Phosphatase: 91 U/L (ref 38–126)
Anion gap: 8 (ref 5–15)
BUN: 9 mg/dL (ref 6–20)
CO2: 25 mmol/L (ref 22–32)
Calcium: 9 mg/dL (ref 8.9–10.3)
Chloride: 105 mmol/L (ref 98–111)
Creatinine, Ser: 0.89 mg/dL (ref 0.61–1.24)
GFR, Estimated: >60 mL/min (ref >60)
Glucose, Bld: 97 mg/dL (ref 70–99)
Potassium: 3.6 mmol/L (ref 3.5–5.1)
Sodium: 138 mmol/L (ref 135–145)
Total Bilirubin: 0.3 mg/dL (ref 0.3–1.2)
Total Protein: 6.7 g/dL (ref 6.5–8.1)

## 2020-10-03 LAB — RESP PANEL BY RT-PCR (FLU A&B, COVID) ARPGX2
Influenza A by PCR: NEGATIVE
Influenza B by PCR: NEGATIVE
SARS Coronavirus 2 by RT PCR: NEGATIVE

## 2020-10-03 LAB — ETHANOL: Alcohol, Ethyl (B): <10 mg/dL (ref <10)

## 2020-10-03 LAB — SALICYLATE LEVEL: Salicylate Lvl: <7 mg/dL — ABNORMAL LOW (ref 7.0–30.0)

## 2020-10-03 MED ORDER — HALOPERIDOL LACTATE 5 MG/ML IJ SOLN
10.0000 mg | Freq: Once | INTRAMUSCULAR | Status: AC
  Administered 2020-10-03: 10 mg via INTRAMUSCULAR
  Filled 2020-10-03: qty 2

## 2020-10-03 MED ORDER — LORAZEPAM 2 MG/ML IJ SOLN
2.0000 mg | Freq: Once | INTRAMUSCULAR | Status: DC
  Filled 2020-10-03: qty 1

## 2020-10-03 MED ORDER — DIPHENHYDRAMINE HCL 50 MG/ML IJ SOLN
50.0000 mg | Freq: Once | INTRAMUSCULAR | Status: AC
  Administered 2020-10-03: 50 mg via INTRAMUSCULAR
  Filled 2020-10-03: qty 1

## 2020-10-03 MED ORDER — LORAZEPAM 2 MG/ML IJ SOLN
2.0000 mg | Freq: Once | INTRAMUSCULAR | Status: AC
  Administered 2020-10-03: 2 mg via INTRAMUSCULAR
  Filled 2020-10-03: qty 1

## 2020-10-03 NOTE — ED Notes (Signed)
Patient is resting with eyes closed. Will continue to monitor.

## 2020-10-03 NOTE — ED Notes (Signed)
IVC, pend psych consult 

## 2020-10-03 NOTE — ED Notes (Signed)
Pt refused labwork and dress out in triage.  Due to pts history of violent behavior, blood and dressout will be done in a room.  Pt has 1 bag which has red necklace, plastic, and a skull cap.

## 2020-10-03 NOTE — ED Triage Notes (Signed)
Pt comes into the ED via [facility] officers under IVC.  Per paperwork the patient has become increasingly combative with the ACT team, community store clerk, and neighbors.  Pt recently discharge from a mental health facility.  Pt having hallucination and delusional in thinking.
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
## 2020-10-03 NOTE — ED Notes (Signed)
Pt given IM medications while in forensic restraints.   Two officers at bedside.

## 2020-10-03 NOTE — ED Notes (Addendum)
Pt given IM Ativan and dressed out with multiple staff and officers in the room.  Pt is still wearing his boxers and socks from home.

## 2020-10-03 NOTE — ED Notes (Signed)
Forensic restraints removed. Officers remain at bedside.

## 2020-10-03 NOTE — BH Assessment (Signed)
This Clinical research associate attempted to assess patient with Psyc NP but patient is unable to be aroused at this time, will attempt at a later time.

## 2020-10-04 DIAGNOSIS — F250 Schizoaffective disorder, bipolar type: Secondary | ICD-10-CM

## 2020-10-04 LAB — LIPID PANEL
Cholesterol: 147 mg/dL (ref 0–200)
HDL: 33 mg/dL — ABNORMAL LOW (ref >40)
LDL Cholesterol: 90 mg/dL (ref 0–99)
Total CHOL/HDL Ratio: 4.5 RATIO
Triglycerides: 122 mg/dL (ref <150)
VLDL: 24 mg/dL (ref 0–40)

## 2020-10-04 LAB — HEMOGLOBIN A1C
Hgb A1c MFr Bld: 5.6 % (ref 4.8–5.6)
Mean Plasma Glucose: 114.02 mg/dL

## 2020-10-04 MED ORDER — LITHIUM CARBONATE ER 450 MG PO TBCR
450.0000 mg | EXTENDED_RELEASE_TABLET | Freq: Two times a day (BID) | ORAL | Status: DC
  Administered 2020-10-04 – 2020-10-11 (×13): 450 mg via ORAL
  Filled 2020-10-04 (×15): qty 1

## 2020-10-04 MED ORDER — OLANZAPINE 10 MG PO TBDP
10.0000 mg | ORAL_TABLET | Freq: Every day | ORAL | Status: DC
  Administered 2020-10-04 – 2020-10-07 (×4): 10 mg via ORAL
  Filled 2020-10-04 (×4): qty 1
  Filled 2020-10-04: qty 2

## 2020-10-04 MED ORDER — BENZTROPINE MESYLATE 1 MG PO TABS
1.0000 mg | ORAL_TABLET | Freq: Every day | ORAL | Status: DC
  Administered 2020-10-04 – 2020-10-10 (×7): 1 mg via ORAL
  Filled 2020-10-04 (×7): qty 1

## 2020-10-04 MED ORDER — RISPERIDONE 1 MG PO TBDP
4.0000 mg | ORAL_TABLET | Freq: Every day | ORAL | Status: DC
  Administered 2020-10-04 – 2020-10-10 (×7): 4 mg via ORAL
  Filled 2020-10-04 (×6): qty 4
  Filled 2020-10-04: qty 8
  Filled 2020-10-04 (×2): qty 4

## 2020-10-04 MED ORDER — ASPIRIN EC 81 MG PO TBEC
81.0000 mg | DELAYED_RELEASE_TABLET | Freq: Every day | ORAL | Status: DC
  Administered 2020-10-05 – 2020-10-11 (×7): 81 mg via ORAL
  Filled 2020-10-04 (×8): qty 1

## 2020-10-04 NOTE — ED Notes (Signed)
Dinner tray given. No other needs found at this moment.  ?

## 2020-10-04 NOTE — ED Notes (Signed)
Hourly rounding completed at this time, patient currently asleep in room. No complaints, stable, and in no acute distress. Q15 minute rounds and monitoring via Rover and Officer to continue. 

## 2020-10-04 NOTE — ED Notes (Signed)
Patient sitting on side of bed reports wants to go home, has made multiple phone calls since his awakening this morning. Calm and cooperative at present time will continue to monitor.

## 2020-10-04 NOTE — ED Notes (Signed)
INVOLUNTARY with all papers on chart/ continues to await placement 

## 2020-10-04 NOTE — ED Notes (Signed)
Gave patient food tray with juice. 

## 2020-10-04 NOTE — ED Notes (Signed)
risperdal is not available in pyxis at this time. Message sent to pharmacy about need for med, pt updated. PT attempting to speak to this nurse but is hard to understand and topics are all over the place

## 2020-10-04 NOTE — ED Notes (Signed)
IVC/ Pending Psych Consult
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
## 2020-10-04 NOTE — ED Notes (Signed)
Report received from [place], [place]. Patient currently sleeping, respirations regular and unlabored. Q15 minute rounds and observation by Psychologist, counselling to continue. Will assess patient once awake.

## 2020-10-04 NOTE — ED Notes (Signed)
Pt given breakfast tray and orange juice. 
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
## 2020-10-04 NOTE — ED Notes (Signed)
Pt contiinues with jumbled speech and is hard to understand. Takes medications that are administered.

## 2020-10-04 NOTE — ED Notes (Signed)
Pt given a sandwich tray and some crackers.

## 2020-10-05 NOTE — ED Notes (Signed)
Hourly rounding completed at this time, patient currently asleep in room. No complaints, stable, and in no acute distress. Q15 minute rounds and monitoring via Rover and Officer to continue. 

## 2020-10-05 NOTE — ED Notes (Addendum)
Received call from case manager for [employer] . [name] @ [phone] would like to coordinate call with his care coordinator ([phone]) EXT [phone]. [name] would like to be alled by either Psych MD or TTS

## 2020-10-05 NOTE — ED Provider Notes (Signed)
Emergency Medicine Observation Re-evaluation Note  Coyt ASIF MUCHOW is a 30 y.o. male, seen on rounds today.  Pt initially presented to the ED for complaints of No chief complaint on file. Currently, the patient is sleeping comfortably.  Physical Exam  BP 111/81 (BP Location: Left Arm)   Pulse 79   Temp 98.6 F (37 C) (Oral)   Resp 16   Ht 5\' 11"  (1.803 m)   Wt 90 kg   SpO2 99%   BMI 27.67 kg/m  Physical Exam Gen: No acute distress  Resp: Normal rise and fall of chest Neuro: Moving all four extremities Psych: Resting currently, calm and cooperative when awake    ED Course / MDM  EKG:    I have reviewed the labs performed to date as well as medications administered while in observation.  Recent changes in the last 24 hours include no acute events overnight.  Plan  Current plan is for inpatient psychiatric treatment.  Placement pending. Patient is under full IVC at this time.   Azucena Dart, , DO 10/05/20 [phone]

## 2020-10-05 NOTE — ED Notes (Signed)
Pt asleep at this time, unable to collect vitals. Will collect pt vitals once awake. 

## 2020-10-05 NOTE — ED Notes (Signed)
Pt transferred and introduced into BHU unit. He is calm and cooperative at this time. Pt lunch tray given to pt.

## 2020-10-05 NOTE — BH Assessment (Signed)
Referral information for Psychiatric Hospitalization faxed to;   . [facility] ([phone]-or- [phone]),   . [name] ([phone]---[phone]---[phone]),  . [facility] ([phone], [phone], [phone] or [phone]),   . [facility] ([phone] or [phone])  . [facility] ([phone]),   . [facility] ([phone] -or- [phone]),   . [facility] ([phone]). 

## 2020-10-05 NOTE — BH Assessment (Signed)
Referral checks:   [name] ([phone]- [phone]), Per [facility], Declined due to no bed availability.    [name] ([phone]), Left a voicemail requesting a call back.   [name] [phone], [phone], [phone] or [phone]), Re-fax requested by Enrique Sack. Task completed at 8:50PM.    [facility] [phone] or [phone]) Left a voicemail requesting a call back.    [facility] [phone]), No answer.   [name] [phone] -or- [phone]), Per Alan Ripper referrals are still being reviewed.    [facility] [phone]). No answer. Mailbox full and unable to accept messages.

## 2020-10-05 NOTE — ED Notes (Signed)
Patient awake and  sitting in day room requesting a spite and telephone

## 2020-10-05 NOTE — ED Notes (Signed)
IVC pending placement 

## 2020-10-05 NOTE — BH Assessment (Signed)
Spoke with Ames Coupe [phone]), he stated he wanted staff to speak with his care coordinator about disposition and [facility] referral./  Writer called and left a HIPPA Compliant message with Care Coordinator [facility]-[phone] ext. [phone]), requesting a return phone call.

## 2020-10-05 NOTE — Consult Note (Signed)
[facility] Face-to-Face Psychiatry Consult   Reason for Consult: Follow-up for 30 year old man with schizoaffective disorder in the emergency room Referring Physician: Scotty Court Patient Identification: Ernest Cook MRN:  [id number] Principal Diagnosis: Schizoaffective disorder, bipolar type (HCC) Diagnosis:  Principal Problem:   Schizoaffective disorder, bipolar type (HCC) Active Problems:   Cannabis use disorder, moderate, dependence (HCC)   Tobacco use disorder   Total Time spent with patient: 30 minutes  Subjective:   Ernest Cook is a 30 y.o. male patient admitted with "I did not do anything".  HPI:   Unwell. See previous note.  Patient has mostly been resting today.  On interview he is still disorganized in his thinking paranoid but not threatening hostile or suicidal.  Tolerating medicine  Past Psychiatric History: Past history of multiple hospitalizations chronic psychotic disorder  Risk to Self:   Risk to Others:   Prior Inpatient Therapy:   Prior Outpatient Therapy:    Past Medical History:  Past Medical History:  Diagnosis Date  . Schizo affective schizophrenia [facility])     Past Surgical History:  Procedure Laterality Date  . ORIF HUMERUS FRACTURE Right 06/27/2020   Procedure: OPEN REDUCTION INTERNAL FIXATION (ORIF) HUMERAL SHAFT FRACTURE;  Surgeon: Roby Lofts, MD;  Location: [facility] OR;  Service: Orthopedics;  Laterality: Right;  . SKIN GRAFT Left unknown   Pt reports left foot and leg skin graft   Family History:  Family History  Problem Relation Age of Onset  . Diabetes Mother   . Hypertension Mother    Family Psychiatric  History: See previous Social History:  Social History   Substance and Sexual Activity  Alcohol Use No     Social History   Substance and Sexual Activity  Drug Use Yes  . Types: Marijuana   Comment: "every now and again"    Social History   Socioeconomic History  . Marital status: Single    Spouse name: Not on file  . Number of  children: Not on file  . Years of education: Not on file  . Highest education level: Not on file  Occupational History  . Not on file  Tobacco Use  . Smoking status: Current Every Day Smoker    Packs/day: 0.25    Types: Cigarettes  . Smokeless tobacco: Never Used  Substance and Sexual Activity  . Alcohol use: No  . Drug use: Yes    Types: Marijuana    Comment: "every now and again"  . Sexual activity: Yes    Birth control/protection: Condom    Comment: Pt reports he is attracted to men  Other Topics Concern  . Not on file  Social History Narrative  . Not on file   Social Determinants of Health   Financial Resource Strain: Not on file  Food Insecurity: Not on file  Transportation Needs: Not on file  Physical Activity: Not on file  Stress: Not on file  Social Connections: Not on file   Additional Social History:    Allergies:   Allergies  Allergen Reactions  . Penicillins Other (See Comments)    Seizure when he was 19    Labs:  Results for orders placed or performed during the hospital encounter of 10/03/20 (from the past 48 hour(s))  Resp Panel by RT-PCR (Flu A&B, Covid) Nasopharyngeal Swab     Status: None   Collection Time: 10/03/20  8:35 PM   Specimen: Nasopharyngeal Swab; Nasopharyngeal(NP) swabs in vial transport medium  Result Value Ref Range   SARS Coronavirus  2 by RT PCR NEGATIVE NEGATIVE    Comment: (NOTE) SARS-CoV-2 target nucleic acids are NOT DETECTED.  The SARS-CoV-2 RNA is generally detectable in upper respiratory specimens during the acute phase of infection. The lowest concentration of SARS-CoV-2 viral copies this assay can detect is 138 copies/mL. A negative result does not preclude SARS-Cov-2 infection and should not be used as the sole basis for treatment or other patient management decisions. A negative result may occur with  improper specimen collection/handling, submission of specimen other than nasopharyngeal swab, presence of viral  mutation(s) within the areas targeted by this assay, and inadequate number of viral copies(<138 copies/mL). A negative result must be combined with clinical observations, patient history, and epidemiological information. The expected result is Negative.  Fact Sheet for Patients:  BloggerCourse.com  Fact Sheet for Healthcare Providers:  SeriousBroker.it  This test is no t yet approved or cleared by the [place] FDA and  has been authorized for detection and/or diagnosis of SARS-CoV-2 by FDA under an Emergency Use Authorization (EUA). This EUA will remain  in effect (meaning this test can be used) for the duration of the COVID-19 declaration under Section 564(b)(1) of the Act, 21 U.S.C.section 360bbb-3(b)(1), unless the authorization is terminated  or revoked sooner.       Influenza A by PCR NEGATIVE NEGATIVE   Influenza B by PCR NEGATIVE NEGATIVE    Comment: (NOTE) The Xpert Xpress SARS-CoV-2/FLU/RSV plus assay is intended as an aid in the diagnosis of influenza from Nasopharyngeal swab specimens and should not be used as a sole basis for treatment. Nasal washings and aspirates are unacceptable for Xpert Xpress SARS-CoV-2/FLU/RSV testing.  Fact Sheet for Patients: BloggerCourse.com  Fact Sheet for Healthcare Providers: SeriousBroker.it  This test is not yet approved or cleared by the [place] FDA and has been authorized for detection and/or diagnosis of SARS-CoV-2 by FDA under an Emergency Use Authorization (EUA). This EUA will remain in effect (meaning this test can be used) for the duration of the COVID-19 declaration under Section 564(b)(1) of the Act, 21 U.S.C. section 360bbb-3(b)(1), unless the authorization is terminated or revoked.  Performed at [facility], [address] [place], [place] [address]   Lipid panel     Status: Abnormal    Collection Time: 10/04/20  4:38 PM  Result Value Ref Range   Cholesterol 147 0 - 200 mg/dL   Triglycerides 845 <364 mg/dL   HDL 33 (L) >68 mg/dL   Total CHOL/HDL Ratio 4.5 RATIO   VLDL 24 0 - 40 mg/dL   LDL Cholesterol 90 0 - 99 mg/dL    Comment:        Total Cholesterol/HDL:CHD Risk Coronary Heart Disease Risk Table                     Men   Women  1/2 Average Risk   3.4   3.3  Average Risk       5.0   4.4  2 X Average Risk   9.6   7.1  3 X Average Risk  23.4   11.0        Use the calculated Patient Ratio above and the CHD Risk Table to determine the patient's CHD Risk.        ATP III CLASSIFICATION (LDL):  <100     mg/dL   Optimal  032-122  mg/dL   Near or Above  Optimal  130-159  mg/dL   Borderline  654-650  mg/dL   High  >354     mg/dL   Very High Performed at [facility], [address] [place], [place] [address]   Hemoglobin A1c     Status: None   Collection Time: 10/04/20  4:38 PM  Result Value Ref Range   Hgb A1c MFr Bld 5.6 4.8 - 5.6 %    Comment: (NOTE) Pre diabetes:          5.7%-6.4%  Diabetes:              >6.4%  Glycemic control for   <7.0% adults with diabetes    Mean Plasma Glucose 114.02 mg/dL    Comment: Performed at [facility], [address] [place], [place] [address]    Current Facility-Administered Medications  Medication Dose Route Frequency Provider Last Rate Last Admin  . aspirin EC tablet 81 mg  81 mg Oral Daily Peggyann Zwiefelhofer, Jackquline Denmark, MD   81 mg at 10/05/20 1055  . benztropine (COGENTIN) tablet 1 mg  1 mg Oral QHS Shenaya Lebo, Jackquline Denmark, MD   1 mg at 10/04/20 2137  . lithium carbonate (ESKALITH) CR tablet 450 mg  450 mg Oral Q12H Yui Mulvaney T, MD   450 mg at 10/05/20 1056  . LORazepam (ATIVAN) injection 2 mg  2 mg Intramuscular Once Gilles Chiquito, MD      . OLANZapine zydis (ZYPREXA) disintegrating tablet 10 mg  10 mg Oral QHS Tirso Laws, Jackquline Denmark, MD   10 mg at 10/04/20 2137  . risperiDONE (RISPERDAL M-TABS)  disintegrating tablet 4 mg  4 mg Oral QHS Jeremie Abdelaziz, Jackquline Denmark, MD   4 mg at 10/04/20 2205   Current Outpatient Medications  Medication Sig Dispense Refill  . aspirin EC 81 MG EC tablet Take 1 tablet (81 mg total) by mouth daily. Swallow whole. 30 tablet 1  . atomoxetine (STRATTERA) 40 MG capsule Take 1 capsule (40 mg total) by mouth every morning. 30 capsule 1  . benztropine (COGENTIN) 1 MG tablet Take 1 tablet (1 mg total) by mouth at bedtime. 30 tablet 1  . haloperidol (HALDOL) 10 MG tablet Take 10 mg by mouth at bedtime.    Marland Kitchen lithium carbonate (ESKALITH) 450 MG CR tablet Take 1 tablet (450 mg total) by mouth every 12 (twelve) hours. 60 tablet 1  . methocarbamol (ROBAXIN) 500 MG tablet Take 1 tablet (500 mg total) by mouth every 6 (six) hours as needed for muscle spasms. 60 tablet 1  . risperiDONE microspheres (RISPERDAL CONSTA) 50 MG injection Inject 2 mLs (50 mg total) into the muscle every 14 (fourteen) days. 1 each 1  . Vitamin D3 (VITAMIN D) 25 MCG tablet Take 2 tablets (2,000 Units total) by mouth daily. 60 tablet 1  . risperiDONE (RISPERDAL) 3 MG tablet Take 1 tablet (3 mg total) by mouth at bedtime. (Patient not taking: Reported on 10/03/2020) 30 tablet 1    Musculoskeletal: Strength & Muscle Tone: within normal limits Gait & Station: normal Patient leans: N/A  Psychiatric Specialty Exam: Physical Exam Vitals and nursing note reviewed.  Constitutional:      Appearance: He is well-developed and well-nourished.  HENT:     Head: Normocephalic and atraumatic.  Eyes:     Conjunctiva/sclera: Conjunctivae normal.     Pupils: Pupils are equal, round, and reactive to light.  Cardiovascular:     Heart sounds: Normal heart sounds.  Pulmonary:  Effort: Pulmonary effort is normal.  Abdominal:     Palpations: Abdomen is soft.  Musculoskeletal:        General: Normal range of motion.     Cervical back: Normal range of motion.  Skin:    General: Skin is warm and dry.  Neurological:      General: No focal deficit present.     Mental Status: He is alert.  Psychiatric:        Attention and Perception: He is inattentive.        Mood and Affect: Affect is inappropriate.        Speech: Speech is delayed.        Behavior: Behavior is slowed.        Thought Content: Thought content is paranoid. Thought content does not include homicidal or suicidal ideation.        Cognition and Memory: Cognition is impaired.        Judgment: Judgment is impulsive.     Review of Systems  Constitutional: Negative.   HENT: Negative.   Eyes: Negative.   Respiratory: Negative.   Cardiovascular: Negative.   Gastrointestinal: Negative.   Musculoskeletal: Negative.   Skin: Negative.   Neurological: Negative.   Psychiatric/Behavioral: Positive for agitation.    Blood pressure (!) 148/90, pulse 94, temperature 98.8 F (37.1 C), temperature source Oral, resp. rate 17, height 5\' 11"  (1.803 m), weight 90 kg, SpO2 97 %.Body mass index is 27.67 kg/m.  General Appearance: Casual  Eye Contact:  Fair  Speech:  Slow  Volume:  Normal  Mood:  Dysphoric  Affect:  Congruent  Thought Process:  Disorganized  Orientation:  Full (Time, Place, and Person)  Thought Content:  Illogical  Suicidal Thoughts:  No  Homicidal Thoughts:  No  Memory:  Immediate;   Fair Recent;   Poor Remote;   Fair  Judgement:  Impaired  Insight:  Shallow  Psychomotor Activity:  Decreased  Concentration:  Concentration: Poor  Recall:  Poor  Fund of Knowledge:  Fair  Language:  Fair  Akathisia:  No  Handed:  Right  AIMS (if indicated):     Assets:  Housing  ADL's:  Impaired  Cognition:  Impaired,  Mild  Sleep:        Treatment Plan Summary: Medication management and Plan Tolerating current antipsychotics and mood stabilizer.  No new complaints.  Plan is for referral to other hospitals for admission.  No change to medication for today.  Disposition: Recommend psychiatric Inpatient admission when medically  cleared.  Mordecai RasmussenJohn Rai Sinagra, MD 10/05/2020 5:57 PM

## 2020-10-06 NOTE — ED Notes (Signed)
Hourly rounding completed at this time, patient currently awake with staff in room. No complaints, stable, and in no acute distress. Q15 minute rounds and monitoring via [employer] to continue.

## 2020-10-06 NOTE — ED Notes (Signed)
Hourly rounding completed at this time, patient currently asleep in room. No complaints, stable, and in no acute distress. Q15 minute rounds and monitoring via Security Cameras to continue. 

## 2020-10-06 NOTE — ED Notes (Signed)
RN attempted to get VS. Pt stated he just wanted to rest.  Will attempted to get VS later.

## 2020-10-06 NOTE — ED Notes (Signed)
Pt asleep at this time, unable to collect vitals. Will collect pt vitals once awake. 

## 2020-10-06 NOTE — ED Notes (Signed)
Report received from Amy, RN including  Situation, Background, Assessment, and Recommendations. Patient alert and oriented, warm and dry, in no acute distress. Patient denies SI, HI, AVH and pain. Patient made aware of Q15 minute rounds and security cameras for their safety. Patient instructed to come to this nurse with needs or concerns. 

## 2020-10-06 NOTE — ED Notes (Signed)
Pt has slept through entirety of care so far, pt easily awoken for assessment and when approached.

## 2020-10-06 NOTE — ED Notes (Signed)
Pt up to bathroom.

## 2020-10-06 NOTE — ED Provider Notes (Signed)
Emergency Medicine Observation Re-evaluation Note  Ernest Cook is a 30 y.o. male, seen on rounds today.  Pt initially presented to the ED for complaints of aggressive behavior.  Currently, the patient is eating breakfast.  Physical Exam  BP (!) 148/90 (BP Location: Right Arm)   Pulse 94   Temp 98.8 F (37.1 C) (Oral)   Resp 17   Ht 1.803 m (5\' 11" )   Wt 90 kg   SpO2 97%   BMI 27.67 kg/m  Physical Exam General: No distress, eating breakfast, not aggressive  Psych: As noted above, no aggression towards me still with disorganized thinking  ED Course / MDM  Patient has been seen by psychiatry and they are attempting placement at outside facility.  Patient has a history of violent behavior.  Plan  Current plan is for pending placement. Patient is under full IVC at this time.   , MD 10/06/20 [phone]

## 2020-10-06 NOTE — BH Assessment (Signed)
Referral checks:   [name] (Angie-[phone]-or- [phone]), Declined due to no insurance and no bed availability.    [name] [phone]---[phone]), unable to reach anyone. Left voicemail message requesting a return phone call.   [name] [phone], [phone], [phone] or [phone]), was unable to reach anyone.   [facility] [phone] or [phone]) Left a voicemail requesting a call back.   [facility] ([place]-[phone]), Pending review.   [name] [phone] -or- [phone]), No beds, pending review.   [facility] [phone]). No answer. Mailbox full and unable to accept messages.

## 2020-10-06 NOTE — ED Notes (Signed)
Pt given drink 

## 2020-10-06 NOTE — ED Notes (Signed)
Pt offered a blanket and TV remote. Pt declined.

## 2020-10-06 NOTE — ED Notes (Signed)
Report received from Jeannette, RN including  Situation, Background, Assessment, and Recommendations. Patient alert and oriented, warm and dry, in no acute distress. Patient denies SI, HI, AVH and pain. Patient made aware of Q15 minute rounds and security cameras for their safety. Patient instructed to come to this nurse with needs or concerns. 

## 2020-10-06 NOTE — ED Notes (Signed)
Meal tray given 

## 2020-10-06 NOTE — ED Notes (Signed)
IVC pending placement 

## 2020-10-06 NOTE — BH Assessment (Signed)
Patient referred to [facility], pending review.    State Referral Form and supporting documentation completed and faxed ([phone]) to [facility]).   Received phone call from [facility] (Marlin-[phone]), stating they have received it and currently working on it.   Verbal screening completed with [facility]([phone]), information faxed and confirmed it was received.

## 2020-10-06 NOTE — BH Assessment (Signed)
This counselor spoke with [facility] [phone]) to request a tracking # for the [facility] packet protocol. Landon contacted this counselor and requested that the [facility] packet info be faxed to [phone]). Task completed at 2000. Landon advised TTS to call back on 10/07/20 after 8:30 to request the tracking number.

## 2020-10-07 NOTE — ED Notes (Signed)
Hourly rounding completed at this time, patient currently asleep in room. No complaints, stable, and in no acute distress. Q15 minute rounds and monitoring via Security Cameras to continue. 

## 2020-10-07 NOTE — ED Provider Notes (Signed)
Emergency Medicine Observation Re-evaluation Note  Ernest Cook is a 29 y.o. male, seen on rounds today.  Pt initially presented to the ED for complaints of No chief complaint on file. Currently, the patient is resting calmly.  Physical Exam  BP 133/75 (BP Location: Left Arm)   Pulse 87   Temp 98.3 F (36.8 C) (Oral)   Resp 18   Ht 5\' 11"  (1.803 m)   Wt 90 kg   SpO2 98%   BMI 27.67 kg/m  Physical Exam General: no acute distress  Cardiac: rate WNL at last VS check  Lungs: equal chest rise Psych: calm  ED Course / MDM  EKG:EKG Interpretation  Date/Time:  Thursday October 04 2020 16:40:44 EST Ventricular Rate:  77 PR Interval:  190 QRS Duration: 96 QT Interval:  354 QTC Calculation: 400 R Axis:   -31 Text Interpretation: Normal sinus rhythm Left axis deviation Left ventricular hypertrophy ( R in aVL , Romhilt-Estes ) Abnormal ECG Confirmed by UNCONFIRMED, DOCTOR (09-29-1978), editor [id number], Tammy [phone]) on 10/05/2020 9:21:55 AM    I have reviewed the labs performed to date as well as medications administered while in observation.  Recent changes in the last 24 hours include none.  Plan  Current plan is for pending placement. Patient is under full IVC at this time.   10/07/2020, MD 10/07/20 [phone]

## 2020-10-07 NOTE — BH Assessment (Addendum)
Referralchecks:   [name] ([phone]-or- [phone]), Per previous writter, Declined due to no insurance and no bed availability.   [facility] [phone])   [name] [phone], [phone], [phone] or [phone])   [facility] [phone] or [phone])   [place] [phone])   [name] [phone] -or- [phone])   [facility] [phone])   [facility] [phone]) Per Everardo Pacific, [facility] no appropriate bed    [facility] BMU- Per Marchelle Folks, Charge RN no appropriate bed available due to acuity/staffing

## 2020-10-07 NOTE — BH Assessment (Signed)
This Clinical research associate spoke with Magazine features editor of [facility] [employer].  Amber requested pt's referral be refaxed. Task completed at 5:53 AM.

## 2020-10-07 NOTE — ED Notes (Signed)
Pt took meds but after went to bathroom. Unsure if pt threw meds back up in toliet. When asked about it, pt states "I took the meds".

## 2020-10-07 NOTE — ED Notes (Signed)
Pt asleep at this time, unable to collect vitals. Will collect pt vitals once awake. 

## 2020-10-07 NOTE — ED Notes (Signed)
Pt. Was given his dinner tray and a drink.  

## 2020-10-07 NOTE — ED Notes (Signed)
,  Hourly rounding completed at this time, patient currently asleep in room. No complaints, stable, and in no acute distress. Q15 minute rounds and monitoring via Security Cam[employer] to continue. 

## 2020-10-07 NOTE — ED Notes (Signed)
Pt is asleep. Vs will be assess when pt is awake.  

## 2020-10-07 NOTE — BH Assessment (Signed)
TTS confirmed pt to be added to [facility] waitlist with Judeth Cornfield [facility] [phone])

## 2020-10-07 NOTE — ED Notes (Signed)
Patient is IVC pending placement 

## 2020-10-07 NOTE — ED Notes (Signed)
Breakfast tray given to pt. Vs assessed and shower offered. Pt refused, "I'll take one when I go home." No other needs found at this moment.

## 2020-10-08 MED ORDER — OLANZAPINE 10 MG PO TBDP
20.0000 mg | ORAL_TABLET | Freq: Every day | ORAL | Status: DC
  Administered 2020-10-08 – 2020-10-10 (×3): 20 mg via ORAL
  Filled 2020-10-08 (×5): qty 2

## 2020-10-08 MED ORDER — DIVALPROEX SODIUM 500 MG PO DR TAB
500.0000 mg | DELAYED_RELEASE_TABLET | Freq: Two times a day (BID) | ORAL | Status: DC
  Administered 2020-10-08 – 2020-10-11 (×6): 500 mg via ORAL
  Filled 2020-10-08 (×7): qty 1

## 2020-10-08 NOTE — ED Notes (Signed)
PT IVC Pending placement

## 2020-10-08 NOTE — ED Notes (Signed)
No VS at this time due to pt sleeping. Will obtain when pt wakes up. 

## 2020-10-08 NOTE — BH Assessment (Signed)
TTS confirmed pt to be added to [facility] waitlist with Deanna [facility] [phone])   TTS was contacted this morning by Anell Barr, who provided an authorization code of [id number] TEVG) for [facility] referral. Marthe Patch reports the code to be effective from 10/08/20-10/14/20 however, TTS is aware of [facility] no longer requiring authorization codes for psychiatric referrals and confirmed the information with Deann who also confirmed no authorization code to be needed at this time.   Pt remains on [facility] waitlist.

## 2020-10-08 NOTE — ED Provider Notes (Signed)
Emergency Medicine Observation Re-evaluation Note  Ernest Cook is a 30 y.o. male, seen on rounds today.  Pt initially presented to the ED for complaints of abnormal behavior.  Currently, the patient is calm cooperative, lying in bed, no distress no complaints..  Physical Exam  BP (!) 117/98 (BP Location: Right Arm)   Pulse 87   Temp 98.1 F (36.7 C) (Oral)   Resp 18   Ht 5\' 11"  (1.803 m)   Wt 90 kg   SpO2 99%   BMI 27.67 kg/m  Physical Exam General: Calm cooperative.  No distress. Cardiac: Regular rate and rhythm around 90 bpm.  No murmur. Lungs: Clear lung sounds bilaterally Psych: Calm and cooperative  ED Course / MDM   No new labs over the past 24 hours.  Plan  Current plan is for patient placement by psychiatry. Patient is under full IVC at this time.   , MD 10/08/20 1520

## 2020-10-08 NOTE — BH Assessment (Signed)
This Clinical research associate contacted [facility] and spoke with Delice Bison who reports patient is on the waitlist [facility] [phone])

## 2020-10-08 NOTE — Consult Note (Signed)
[facility] Face-to-Face Psychiatry Consult   Reason for Consult: Consult follow-up 30 year old man with schizoaffective disorder Referring Physician: [place] Patient Identification: [name] MRN:  [id number] Principal Diagnosis: Schizoaffective disorder, bipolar type (HCC) Diagnosis:  Principal Problem:   Schizoaffective disorder, bipolar type (HCC) Active Problems:   Cannabis use disorder, moderate, dependence (HCC)   Tobacco use disorder   Total Time spent with patient: 30 minutes  Subjective:   Ernest Cook is a 30 y.o. male patient admitted with "I just want to go home".  HPI: Patient seen chart reviewed.  Patient has been mostly calm compared to how he has been in the past.  Not violent or threatening.  Compliant with medicine.  When I talked to him however today he still has pressured speech disorganized thought no insight.  Does not recall or acknowledges inappropriate behavior that brought him into the hospital.  Not able to think clearly enough to make any plans for the future.  Gets agitated and grandiose and delusional pretty easily  Past Psychiatric History: History of schizoaffective disorder multiple hospitalizations recently  Risk to Self:   Risk to Others:   Prior Inpatient Therapy:   Prior Outpatient Therapy:    Past Medical History:  Past Medical History:  Diagnosis Date  . Schizo affective schizophrenia [facility])     Past Surgical History:  Procedure Laterality Date  . ORIF HUMERUS FRACTURE Right 06/27/2020   Procedure: OPEN REDUCTION INTERNAL FIXATION (ORIF) HUMERAL SHAFT FRACTURE;  Surgeon: Roby Lofts, MD;  Location: [facility] OR;  Service: Orthopedics;  Laterality: Right;  . SKIN GRAFT Left unknown   Pt reports left foot and leg skin graft   Family History:  Family History  Problem Relation Age of Onset  . Diabetes Mother   . Hypertension Mother    Family Psychiatric  History: See prior Social History:  Social History   Substance and Sexual Activity   Alcohol Use No     Social History   Substance and Sexual Activity  Drug Use Yes  . Types: Marijuana   Comment: "every now and again"    Social History   Socioeconomic History  . Marital status: Single    Spouse name: Not on file  . Number of children: Not on file  . Years of education: Not on file  . Highest education level: Not on file  Occupational History  . Not on file  Tobacco Use  . Smoking status: Current Every Day Smoker    Packs/day: 0.25    Types: Cigarettes  . Smokeless tobacco: Never Used  Substance and Sexual Activity  . Alcohol use: No  . Drug use: Yes    Types: Marijuana    Comment: "every now and again"  . Sexual activity: Yes    Birth control/protection: Condom    Comment: Pt reports he is attracted to men  Other Topics Concern  . Not on file  Social History Narrative  . Not on file   Social Determinants of Health   Financial Resource Strain: Not on file  Food Insecurity: Not on file  Transportation Needs: Not on file  Physical Activity: Not on file  Stress: Not on file  Social Connections: Not on file   Additional Social History:    Allergies:   Allergies  Allergen Reactions  . Penicillins Other (See Comments)    Seizure when he was 19    Labs: No results found for this or any previous visit (from the past 48 hour(s)).  Current Facility-Administered  Medications  Medication Dose Route Frequency Provider Last Rate Last Admin  . aspirin EC tablet 81 mg  81 mg Oral Daily Lindzey Zent, Jackquline Denmark, MD   81 mg at 10/08/20 1052  . benztropine (COGENTIN) tablet 1 mg  1 mg Oral QHS Monte Zinni, Jackquline Denmark, MD   1 mg at 10/07/20 2218  . divalproex (DEPAKOTE) DR tablet 500 mg  500 mg Oral Q12H Machaela Caterino, Jackquline Denmark, MD   500 mg at 10/08/20 1553  . lithium carbonate (ESKALITH) CR tablet 450 mg  450 mg Oral Q12H Gildo Crisco, Jackquline Denmark, MD   450 mg at 10/08/20 1052  . LORazepam (ATIVAN) injection 2 mg  2 mg Intramuscular Once Gilles Chiquito, MD      . OLANZapine zydis  (ZYPREXA) disintegrating tablet 20 mg  20 mg Oral QHS Mekenna Finau T, MD      . risperiDONE (RISPERDAL M-TABS) disintegrating tablet 4 mg  4 mg Oral QHS Davy Faught, Jackquline Denmark, MD   4 mg at 10/07/20 2218   Current Outpatient Medications  Medication Sig Dispense Refill  . aspirin EC 81 MG EC tablet Take 1 tablet (81 mg total) by mouth daily. Swallow whole. 30 tablet 1  . atomoxetine (STRATTERA) 40 MG capsule Take 1 capsule (40 mg total) by mouth every morning. 30 capsule 1  . benztropine (COGENTIN) 1 MG tablet Take 1 tablet (1 mg total) by mouth at bedtime. 30 tablet 1  . haloperidol (HALDOL) 10 MG tablet Take 10 mg by mouth at bedtime.    Marland Kitchen lithium carbonate (ESKALITH) 450 MG CR tablet Take 1 tablet (450 mg total) by mouth every 12 (twelve) hours. 60 tablet 1  . methocarbamol (ROBAXIN) 500 MG tablet Take 1 tablet (500 mg total) by mouth every 6 (six) hours as needed for muscle spasms. 60 tablet 1  . risperiDONE microspheres (RISPERDAL CONSTA) 50 MG injection Inject 2 mLs (50 mg total) into the muscle every 14 (fourteen) days. 1 each 1  . Vitamin D3 (VITAMIN D) 25 MCG tablet Take 2 tablets (2,000 Units total) by mouth daily. 60 tablet 1  . risperiDONE (RISPERDAL) 3 MG tablet Take 1 tablet (3 mg total) by mouth at bedtime. (Patient not taking: Reported on 10/03/2020) 30 tablet 1    Musculoskeletal: Strength & Muscle Tone: within normal limits Gait & Station: normal Patient leans: N/A  Psychiatric Specialty Exam: Physical Exam Vitals and nursing note reviewed.  Constitutional:      Appearance: He is well-developed and well-nourished.  HENT:     Head: Normocephalic and atraumatic.  Eyes:     Conjunctiva/sclera: Conjunctivae normal.     Pupils: Pupils are equal, round, and reactive to light.  Cardiovascular:     Heart sounds: Normal heart sounds.  Pulmonary:     Effort: Pulmonary effort is normal.  Abdominal:     Palpations: Abdomen is soft.  Musculoskeletal:        General: Normal range of  motion.     Cervical back: Normal range of motion.  Skin:    General: Skin is warm and dry.  Neurological:     General: No focal deficit present.     Mental Status: He is alert.  Psychiatric:        Attention and Perception: He is inattentive.        Mood and Affect: Affect is labile.        Speech: Speech is rapid and pressured.        Behavior: Behavior is agitated.  Thought Content: Thought content is paranoid. Thought content does not include homicidal or suicidal ideation.        Cognition and Memory: Cognition is impaired.     Review of Systems  Constitutional: Negative.   HENT: Negative.   Eyes: Negative.   Respiratory: Negative.   Cardiovascular: Negative.   Gastrointestinal: Negative.   Musculoskeletal: Negative.   Skin: Negative.   Neurological: Negative.   Psychiatric/Behavioral: Negative.     Blood pressure (!) 117/98, pulse 87, temperature 98.1 F (36.7 C), temperature source Oral, resp. rate 18, height 5\' 11"  (1.803 m), weight 90 kg, SpO2 99 %.Body mass index is 27.67 kg/m.  General Appearance: Casual  Eye Contact:  Fair  Speech:  Pressured  Volume:  Increased  Mood:  Dysphoric  Affect:  Congruent  Thought Process:  Disorganized  Orientation:  Full (Time, Place, and Person)  Thought Content:  Illogical  Suicidal Thoughts:  No  Homicidal Thoughts:  No  Memory:  Immediate;   Fair Recent;   Poor Remote;   Poor  Judgement:  Impaired  Insight:  Lacking  Psychomotor Activity:  Restlessness  Concentration:  Concentration: Poor  Recall:  Poor  Fund of Knowledge:  Poor  Language:  Poor  Akathisia:  Negative  Handed:  Right  AIMS (if indicated):     Assets:  Housing  ADL's:  Impaired  Cognition:  Impaired,  Mild  Sleep:        Treatment Plan Summary: Plan Increase dose of Zyprexa.  Add Depakote as an extra mood stabilizer.  Continue to look into possible referrals for hospitalization.  No change to IVC status  Disposition: Recommend  psychiatric Inpatient admission when medically cleared.  , MD 10/08/2020 6:13 PM

## 2020-10-09 NOTE — ED Notes (Signed)
Hourly rounding completed at this time, patient currently asleep in room. No complaints, stable, and in no acute distress. Q15 minute rounds and monitoring via Security Cameras to continue. 

## 2020-10-09 NOTE — ED Provider Notes (Signed)
Emergency Medicine Observation Re-evaluation Note  Abdikadir ASTIN SAYRE is a 30 y.o. male, seen on rounds today.  Pt initially presented to the ED for complaints of No chief complaint on file.  Currently, the patient is is no acute distress. Denies any concerns at this time.  He is laying in bed comfortably.  Physical Exam  Blood pressure (!) 117/98, pulse 87, temperature 98.1 F (36.7 C), temperature source Oral, resp. rate 18, height 5\' 11"  (1.803 m), weight 90 kg, SpO2 99 %.  Physical Exam General: No apparent distress HEENT: moist mucous membranes CV: RRR Pulm: Normal WOB GI: soft and non tender MSK: no edema or cyanosis Neuro: face symmetric, moving all extremities     ED Course / MDM     I have reviewed the labs performed to date as well as medications administered while in observation.  Recent changes in the last 24 hours include none  Plan   Current plan is to continue to wait for psych plan/placement if felt warranted  Patient is under full IVC at this time. [facility] waitlist    , MD 10/09/20 1021

## 2020-10-09 NOTE — ED Notes (Signed)
Hourly rounding completed at this time, patient currently awake in room. No complaints, stable, and in no acute distress. Q15 minute rounds and monitoring via Security Cameras to continue. 

## 2020-10-09 NOTE — ED Notes (Signed)
Pt given breakfast tray

## 2020-10-09 NOTE — ED Notes (Signed)
Pt refused shower or change of clothes. 

## 2020-10-09 NOTE — ED Notes (Signed)
Pharmacy sent secure message requesting missing dose of risperdal so can be administered. Will admin all meds at once

## 2020-10-09 NOTE — ED Notes (Signed)
IVC/ On [facility] Wait List

## 2020-10-09 NOTE — ED Notes (Signed)
[place] at bedside 

## 2020-10-09 NOTE — ED Notes (Signed)
Pt is frustrated and becoming agitated at this time and expressing his disinterest in being here and taking medications. Pt states that "y'all are trying to over medicate me. Y'all don't know how to medicate people here. I ain't going to make my heart race for anyone in here." pt is labile with moods, but cooperative. Takes all medications but questions what each one is. Pt tells this nurse that we are attempting to thirst pt to death despite pt having two empty cups of drink beside bed that were provided over this shift, pt provided with 2 ginger ales and again encouraged to step to desk and ask for more beverage when he is thirsty.

## 2020-10-09 NOTE — ED Notes (Signed)
Report received from Jessica, RN including  Situation, Background, Assessment, and Recommendations. Patient alert and oriented, warm and dry, in no acute distress. Patient denies SI, HI, AVH and pain. Patient made aware of Q15 minute rounds and security cameras for their safety. Patient instructed to come to this nurse with needs or concerns. 

## 2020-10-09 NOTE — Consult Note (Signed)
[facility] Face-to-Face Psychiatry Consult   Reason for Consult: Follow-up consult 30 year old man with schizoaffective disorder Referring Physician: Fuller Plan Patient Identification: Ernest Cook MRN:  [id number] Principal Diagnosis: Schizoaffective disorder, bipolar type (HCC) Diagnosis:  Principal Problem:   Schizoaffective disorder, bipolar type (HCC) Active Problems:   Cannabis use disorder, moderate, dependence (HCC)   Tobacco use disorder   Total Time spent with patient: 30 minutes  Subjective:   Ernest Cook is a 30 y.o. male patient admitted with "I did not do anything".  HPI: Patient seen chart reviewed.  30 year old man with multiple visits to the emergency room and hospitalizations repeatedly.  Back in the emergency room with psychosis and reports that he had been threatening and aggressive towards people in public [place] in [place].  Patient has been largely cooperative with medication since being in the emergency room and has not been aggressive or agitated.  On interview today he is tired but awake and reactive.  He seems to be a little rambling and confused at times but not necessarily delirious.  He denies having done anything aggressive back home.  As usual he claims that he was just living his life normally and other people were persecuting him.  I did add Depakote and increased his Zyprexa since we know that he had been cooperative with his lithium and his antipsychotic prior to admission.  We have tried referring him out to hospitals without any reply at this time.  Past Psychiatric History: See previous.  Multiple hospitalizations sequentially  Risk to Self:   Risk to Others:   Prior Inpatient Therapy:   Prior Outpatient Therapy:    Past Medical History:  Past Medical History:  Diagnosis Date  . Schizo affective schizophrenia [facility])     Past Surgical History:  Procedure Laterality Date  . ORIF HUMERUS FRACTURE Right 06/27/2020   Procedure: OPEN REDUCTION INTERNAL  FIXATION (ORIF) HUMERAL SHAFT FRACTURE;  Surgeon: Roby Lofts, MD;  Location: [facility] OR;  Service: Orthopedics;  Laterality: Right;  . SKIN GRAFT Left unknown   Pt reports left foot and leg skin graft   Family History:  Family History  Problem Relation Age of Onset  . Diabetes Mother   . Hypertension Mother    Family Psychiatric  History: See previous Social History:  Social History   Substance and Sexual Activity  Alcohol Use No     Social History   Substance and Sexual Activity  Drug Use Yes  . Types: Marijuana   Comment: "every now and again"    Social History   Socioeconomic History  . Marital status: Single    Spouse name: Not on file  . Number of children: Not on file  . Years of education: Not on file  . Highest education level: Not on file  Occupational History  . Not on file  Tobacco Use  . Smoking status: Current Every Day Smoker    Packs/day: 0.25    Types: Cigarettes  . Smokeless tobacco: Never Used  Substance and Sexual Activity  . Alcohol use: No  . Drug use: Yes    Types: Marijuana    Comment: "every now and again"  . Sexual activity: Yes    Birth control/protection: Condom    Comment: Pt reports he is attracted to men  Other Topics Concern  . Not on file  Social History Narrative  . Not on file   Social Determinants of Health   Financial Resource Strain: Not on file  Food Insecurity: Not  on file  Transportation Needs: Not on file  Physical Activity: Not on file  Stress: Not on file  Social Connections: Not on file   Additional Social History:    Allergies:   Allergies  Allergen Reactions  . Penicillins Other (See Comments)    Seizure when he was 19    Labs: No results found for this or any previous visit (from the past 48 hour(s)).  Current Facility-Administered Medications  Medication Dose Route Frequency Provider Last Rate Last Admin  . aspirin EC tablet 81 mg  81 mg Oral Daily Lauriel Helin, Jackquline Denmark, MD   81 mg at 10/09/20 0920   . benztropine (COGENTIN) tablet 1 mg  1 mg Oral QHS Yashas Camilli T, MD   1 mg at 10/08/20 2135  . divalproex (DEPAKOTE) DR tablet 500 mg  500 mg Oral Q12H Zaylin Pistilli, Jackquline Denmark, MD   500 mg at 10/09/20 0920  . lithium carbonate (ESKALITH) CR tablet 450 mg  450 mg Oral Q12H Stephen Turnbaugh, Jackquline Denmark, MD   450 mg at 10/09/20 0920  . LORazepam (ATIVAN) injection 2 mg  2 mg Intramuscular Once Gilles Chiquito, MD      . OLANZapine zydis (ZYPREXA) disintegrating tablet 20 mg  20 mg Oral QHS Semaja Lymon, Jackquline Denmark, MD   20 mg at 10/08/20 2136  . risperiDONE (RISPERDAL M-TABS) disintegrating tablet 4 mg  4 mg Oral QHS Ahaana Rochette, Jackquline Denmark, MD   4 mg at 10/08/20 2135   Current Outpatient Medications  Medication Sig Dispense Refill  . aspirin EC 81 MG EC tablet Take 1 tablet (81 mg total) by mouth daily. Swallow whole. 30 tablet 1  . atomoxetine (STRATTERA) 40 MG capsule Take 1 capsule (40 mg total) by mouth every morning. 30 capsule 1  . benztropine (COGENTIN) 1 MG tablet Take 1 tablet (1 mg total) by mouth at bedtime. 30 tablet 1  . haloperidol (HALDOL) 10 MG tablet Take 10 mg by mouth at bedtime.    Marland Kitchen lithium carbonate (ESKALITH) 450 MG CR tablet Take 1 tablet (450 mg total) by mouth every 12 (twelve) hours. 60 tablet 1  . methocarbamol (ROBAXIN) 500 MG tablet Take 1 tablet (500 mg total) by mouth every 6 (six) hours as needed for muscle spasms. 60 tablet 1  . risperiDONE microspheres (RISPERDAL CONSTA) 50 MG injection Inject 2 mLs (50 mg total) into the muscle every 14 (fourteen) days. 1 each 1  . Vitamin D3 (VITAMIN D) 25 MCG tablet Take 2 tablets (2,000 Units total) by mouth daily. 60 tablet 1  . risperiDONE (RISPERDAL) 3 MG tablet Take 1 tablet (3 mg total) by mouth at bedtime. (Patient not taking: Reported on 10/03/2020) 30 tablet 1    Musculoskeletal: Strength & Muscle Tone: within normal limits Gait & Station: normal Patient leans: N/A  Psychiatric Specialty Exam: Physical Exam Vitals and nursing note reviewed.   Constitutional:      Appearance: He is well-developed and well-nourished.  HENT:     Head: Normocephalic and atraumatic.  Eyes:     Conjunctiva/sclera: Conjunctivae normal.     Pupils: Pupils are equal, round, and reactive to light.  Cardiovascular:     Heart sounds: Normal heart sounds.  Pulmonary:     Effort: Pulmonary effort is normal.  Abdominal:     Palpations: Abdomen is soft.  Musculoskeletal:        General: Normal range of motion.     Cervical back: Normal range of motion.  Skin:    General:  Skin is warm and dry.  Neurological:     General: No focal deficit present.     Mental Status: He is alert.  Psychiatric:        Attention and Perception: He is inattentive.        Mood and Affect: Affect is blunt.        Speech: Speech is slurred.        Thought Content: Thought content does not include homicidal or suicidal ideation.        Cognition and Memory: Cognition is impaired.     Review of Systems  Constitutional: Negative.   HENT: Negative.   Eyes: Negative.   Respiratory: Negative.   Cardiovascular: Negative.   Gastrointestinal: Negative.   Musculoskeletal: Negative.   Skin: Negative.   Neurological: Negative.   Psychiatric/Behavioral: Positive for confusion.    Blood pressure (!) 131/94, pulse 84, temperature 98 F (36.7 C), temperature source Oral, resp. rate 18, height 5\' 11"  (1.803 m), weight 90 kg, SpO2 99 %.Body mass index is 27.67 kg/m.  General Appearance: Casual  Eye Contact:  Minimal  Speech:  Slow  Volume:  Decreased  Mood:  Euthymic  Affect:  Constricted  Thought Process:  Disorganized  Orientation:  Full (Time, Place, and Person)  Thought Content:  Illogical and Paranoid Ideation  Suicidal Thoughts:  No  Homicidal Thoughts:  No  Memory:  Immediate;   Fair Recent;   Poor Remote;   Fair  Judgement:  Poor  Insight:  Lacking  Psychomotor Activity:  Decreased  Concentration:  Concentration: Poor  Recall:  of Knowledge:  Fair   Language:  Fair  Akathisia:  No  Handed:  Right  AIMS (if indicated):     Assets:  Desire for Improvement Physical Health Resilience  ADL's:  Impaired  Cognition:  Impaired,  Mild  Sleep:        Treatment Plan Summary: Medication management and Plan 30 year old man with chronic psychotic disorder remains disorganized and confused at times but at least not violent or threatening.  We had previously been pursuing a strategy of [facility] state hospital but have recently had it made clear to 26 that that is not going to work at this time because of limitations and staff at the state hospital.  Therefore we have referred him to other hospitals but in the meantime we will consider him for possible admission here.  I have got him now on 2 antipsychotics +2 mood stabilizers.  Possibly a little oversedated but not excessively so.  No change to medicine today.  Disposition: Recommend psychiatric Inpatient admission when medically cleared.  [place], MD 10/09/2020 1:46 PM

## 2020-10-09 NOTE — BH Assessment (Signed)
This Clinical research associate contacted [facility] and spoke with Merlyn Albert who reports patient is on the waitlist [facility] [phone])

## 2020-10-10 NOTE — ED Notes (Signed)
Pt asleep at this time, unable to collect vitals. Will collect pt vitals once awake. 

## 2020-10-10 NOTE — ED Notes (Signed)
Hourly rounding completed at this time, patient currently asleep in room. No complaints, stable, and in no acute distress. Q15 minute rounds and monitoring via Security Cameras to continue. 

## 2020-10-10 NOTE — ED Notes (Signed)
Pt given lunch tray and drink at this time. 

## 2020-10-10 NOTE — ED Provider Notes (Signed)
Emergency Medicine Observation Re-evaluation Note  Ernest Cook is a 30 y.o. male, seen on rounds today.  Pt initially presented to the ED for complaints of No chief complaint on file. Currently, the patient is laying in bed, denies complaints.  Physical Exam  BP 129/78 (BP Location: Left Arm)   Pulse 80   Temp 97.6 F (36.4 C) (Oral)   Resp 16   Ht 5\' 11"  (1.803 m)   Wt 90 kg   SpO2 100%   BMI 27.67 kg/m  Physical Exam Constitutional: Resting comfortably. Eyes: Conjunctivae are normal. Head: Atraumatic. Nose: No congestion/rhinnorhea. Mouth/Throat: Mucous membranes are moist. Neck: Normal ROM Cardiovascular: No cyanosis noted. Respiratory: Normal respiratory effort. Gastrointestinal: Non-distended. Genitourinary: deferred Musculoskeletal: No lower extremity tenderness nor edema. Neurologic:  Normal speech and language. No gross focal neurologic deficits are appreciated. Skin:  Skin is warm, dry and intact. No rash noted.    ED Course / MDM  EKG:EKG Interpretation  Date/Time:  Thursday October 04 2020 16:40:44 EST Ventricular Rate:  77 PR Interval:  190 QRS Duration: 96 QT Interval:  354 QTC Calculation: 400 R Axis:   -31 Text Interpretation: Normal sinus rhythm Left axis deviation Left ventricular hypertrophy ( R in aVL , Romhilt-Estes ) Abnormal ECG Confirmed by UNCONFIRMED, DOCTOR (09-29-1978), editor [id number], Tammy [phone]) on 10/05/2020 9:21:55 AM    I have reviewed the labs performed to date as well as medications administered while in observation.  Recent changes in the last 24 hours include none.  Plan  Current plan is for psychiatric admission pending placement. Patient is under full IVC at this time.   10/07/2020, MD 10/10/20 [phone]

## 2020-10-10 NOTE — ED Notes (Signed)
Report received from Amy, RN including  Situation, Background, Assessment, and Recommendations. Patient alert and oriented, warm and dry, in no acute distress. Patient denies SI, HI, AVH and pain. Patient made aware of Q15 minute rounds and security cameras for their safety. Patient instructed to come to this nurse with needs or concerns. 

## 2020-10-10 NOTE — ED Notes (Signed)
Hourly rounding completed at this time, patient currently awake in room. No complaints, stable, and in no acute distress. Q15 minute rounds and monitoring via Security Cameras to continue. 

## 2020-10-10 NOTE — ED Notes (Signed)
IVC  ON  [facility]  WAITLIST 

## 2020-10-11 MED ORDER — OLANZAPINE 20 MG PO TBDP: 20.0000 mg | ORAL_TABLET | Freq: Every day | ORAL | 0 refills | Status: DC

## 2020-10-11 MED ORDER — DIVALPROEX SODIUM 500 MG PO DR TAB: 500.0000 mg | DELAYED_RELEASE_TABLET | Freq: Two times a day (BID) | ORAL | 0 refills | Status: DC

## 2020-10-11 MED ORDER — RISPERIDONE 4 MG PO TBDP: 4.0000 mg | ORAL_TABLET | Freq: Every day | ORAL | 0 refills | Status: DC

## 2020-10-11 MED ORDER — LITHIUM CARBONATE ER 450 MG PO TBCR: 450.0000 mg | EXTENDED_RELEASE_TABLET | Freq: Two times a day (BID) | ORAL | 0 refills | Status: DC

## 2020-10-11 NOTE — ED Notes (Signed)
Writer went [facility] to ask patient if he was ok and to let him know his cousin will not be up to ed until 3:30 would he like to come back into the unit and wait for her until she gets hers. Patient refused and stated he will sit out there and wait he did not want to come back into the unit.

## 2020-10-11 NOTE — ED Notes (Signed)
Hourly rounding completed at this time, patient currently asleep in room. No complaints, stable, and in no acute distress. Q15 minute rounds and monitoring via Security Cameras to continue. 

## 2020-10-11 NOTE — ED Notes (Signed)
Patients belonging given back to patient. Patient discharged to home with follow up and prescriptions.

## 2020-10-11 NOTE — Consult Note (Signed)
[facility] Face-to-Face Psychiatry Consult   Reason for Consult: Follow-up consult 30 year old man with schizoaffective disorder Referring Physician: Scotty Court Patient Identification: Ernest Cook MRN:  [id number] Principal Diagnosis: Schizoaffective disorder, bipolar type (HCC) Diagnosis:  Principal Problem:   Schizoaffective disorder, bipolar type (HCC) Active Problems:   Cannabis use disorder, moderate, dependence (HCC)   Tobacco use disorder   Total Time spent with patient: 30 minutes  Subjective:   Ernest Cook is a 30 y.o. male patient admitted with "I did not do anything".  HPI: Patient seen again today.  Case reviewed with behavioral health staff.  Ernest Cook has been compliant with medication throughout his time here in the emergency room.  He has not been showing any aggressive threatening or hostile behavior.  He has not had any fights or arguments with staff or other patients.  He has been cooperative with medication treatment.  On interview today he continues to have some mildly pressured speech and poor insight into show some impulsivity but insists that he did not do anything threatening prior to hospitalization and insists that he has no thoughts of hurting anyone outside the hospital.  He does not show any evidence of any obvious psychotic or delusional thinking.  Past Psychiatric History: Past history of schizoaffective disorder with multiple hospitalizations and emergency room visits this past several months  Risk to Self:   Risk to Others:   Prior Inpatient Therapy:   Prior Outpatient Therapy:    Past Medical History:  Past Medical History:  Diagnosis Date  . Schizo affective schizophrenia [facility])     Past Surgical History:  Procedure Laterality Date  . ORIF HUMERUS FRACTURE Right 06/27/2020   Procedure: OPEN REDUCTION INTERNAL FIXATION (ORIF) HUMERAL SHAFT FRACTURE;  Surgeon: Roby Lofts, MD;  Location: [facility] OR;  Service: Orthopedics;  Laterality: Right;  . SKIN  GRAFT Left unknown   Pt reports left foot and leg skin graft   Family History:  Family History  Problem Relation Age of Onset  . Diabetes Mother   . Hypertension Mother    Family Psychiatric  History: See previous Social History:  Social History   Substance and Sexual Activity  Alcohol Use No     Social History   Substance and Sexual Activity  Drug Use Yes  . Types: Marijuana   Comment: "every now and again"    Social History   Socioeconomic History  . Marital status: Single    Spouse name: Not on file  . Number of children: Not on file  . Years of education: Not on file  . Highest education level: Not on file  Occupational History  . Not on file  Tobacco Use  . Smoking status: Current Every Day Smoker    Packs/day: 0.25    Types: Cigarettes  . Smokeless tobacco: Never Used  Substance and Sexual Activity  . Alcohol use: No  . Drug use: Yes    Types: Marijuana    Comment: "every now and again"  . Sexual activity: Yes    Birth control/protection: Condom    Comment: Pt reports he is attracted to men  Other Topics Concern  . Not on file  Social History Narrative  . Not on file   Social Determinants of Health   Financial Resource Strain: Not on file  Food Insecurity: Not on file  Transportation Needs: Not on file  Physical Activity: Not on file  Stress: Not on file  Social Connections: Not on file   Additional Social History:  Allergies:   Allergies  Allergen Reactions  . Penicillins Other (See Comments)    Seizure when he was 19    Labs: No results found for this or any previous visit (from the past 48 hour(s)).  Current Facility-Administered Medications  Medication Dose Route Frequency Provider Last Rate Last Admin  . aspirin EC tablet 81 mg  81 mg Oral Daily Arjun Hard, Jackquline Denmark, MD   81 mg at 10/11/20 1034  . benztropine (COGENTIN) tablet 1 mg  1 mg Oral QHS Michala Deblanc, Jackquline Denmark, MD   1 mg at 10/10/20 2154  . divalproex (DEPAKOTE) DR tablet 500 mg   500 mg Oral Q12H Payden Docter, Jackquline Denmark, MD   500 mg at 10/11/20 1034  . lithium carbonate (ESKALITH) CR tablet 450 mg  450 mg Oral Q12H Adanya Sosinski, Jackquline Denmark, MD   450 mg at 10/11/20 1035  . LORazepam (ATIVAN) injection 2 mg  2 mg Intramuscular Once Gilles Chiquito, MD      . OLANZapine zydis (ZYPREXA) disintegrating tablet 20 mg  20 mg Oral QHS Rolande Moe, Jackquline Denmark, MD   20 mg at 10/10/20 2154  . risperiDONE (RISPERDAL M-TABS) disintegrating tablet 4 mg  4 mg Oral QHS Demetrice Amstutz, Jackquline Denmark, MD   4 mg at 10/10/20 2153   Current Outpatient Medications  Medication Sig Dispense Refill  . aspirin EC 81 MG EC tablet Take 1 tablet (81 mg total) by mouth daily. Swallow whole. 30 tablet 1  . atomoxetine (STRATTERA) 40 MG capsule Take 1 capsule (40 mg total) by mouth every morning. 30 capsule 1  . benztropine (COGENTIN) 1 MG tablet Take 1 tablet (1 mg total) by mouth at bedtime. 30 tablet 1  . haloperidol (HALDOL) 10 MG tablet Take 10 mg by mouth at bedtime.    Marland Kitchen lithium carbonate (ESKALITH) 450 MG CR tablet Take 1 tablet (450 mg total) by mouth every 12 (twelve) hours. 60 tablet 1  . methocarbamol (ROBAXIN) 500 MG tablet Take 1 tablet (500 mg total) by mouth every 6 (six) hours as needed for muscle spasms. 60 tablet 1  . risperiDONE microspheres (RISPERDAL CONSTA) 50 MG injection Inject 2 mLs (50 mg total) into the muscle every 14 (fourteen) days. 1 each 1  . Vitamin D3 (VITAMIN D) 25 MCG tablet Take 2 tablets (2,000 Units total) by mouth daily. 60 tablet 1  . risperiDONE (RISPERDAL) 3 MG tablet Take 1 tablet (3 mg total) by mouth at bedtime. (Patient not taking: Reported on 10/03/2020) 30 tablet 1    Musculoskeletal: Strength & Muscle Tone: within normal limits Gait & Station: normal Patient leans: N/A  Psychiatric Specialty Exam: Physical Exam Vitals and nursing note reviewed.  Constitutional:      Appearance: He is well-developed and well-nourished.  HENT:     Head: Normocephalic and atraumatic.  Eyes:      Conjunctiva/sclera: Conjunctivae normal.     Pupils: Pupils are equal, round, and reactive to light.  Cardiovascular:     Heart sounds: Normal heart sounds.  Pulmonary:     Effort: Pulmonary effort is normal.  Abdominal:     Palpations: Abdomen is soft.  Musculoskeletal:        General: Normal range of motion.     Cervical back: Normal range of motion.  Skin:    General: Skin is warm and dry.  Neurological:     General: No focal deficit present.     Mental Status: He is alert.  Psychiatric:  Attention and Perception: He is inattentive.        Mood and Affect: Mood is anxious.        Speech: Speech is rapid and pressured.        Behavior: Behavior is not agitated, aggressive or hyperactive.        Thought Content: Thought content is not paranoid or delusional. Thought content does not include homicidal or suicidal ideation.        Cognition and Memory: Cognition is impaired.        Judgment: Judgment is impulsive.     Review of Systems  Constitutional: Negative.   HENT: Negative.   Eyes: Negative.   Respiratory: Negative.   Cardiovascular: Negative.   Gastrointestinal: Negative.   Musculoskeletal: Negative.   Skin: Negative.   Neurological: Negative.   Psychiatric/Behavioral: Negative.     Blood pressure 118/74, pulse 82, temperature 98.6 F (37 C), temperature source Oral, resp. rate 19, height 5\' 11"  (1.803 m), weight 90 kg, SpO2 99 %.Body mass index is 27.67 kg/m.  General Appearance: Casual  Eye Contact:  Fair  Speech:  Clear and Coherent and Pressured  Volume:  Increased  Mood:  Euthymic  Affect:  Congruent  Thought Process:  Coherent  Orientation:  Full (Time, Place, and Person)  Thought Content:  Tangential  Suicidal Thoughts:  No  Homicidal Thoughts:  No  Memory:  Immediate;   Fair Recent;   Fair Remote;   Fair  Judgement:  Fair  Insight:  Shallow  Psychomotor Activity:  Normal  Concentration:  Concentration: Fair  Recall:  of  Knowledge:  Fair  Language:  Fair  Akathisia:  Negative  Handed:  Right  AIMS (if indicated):     Assets:  Desire for Improvement Housing Resilience  ADL's:  Impaired  Cognition:  Impaired,  Mild  Sleep:        Treatment Plan Summary: Plan Ernest Cook has been stable here in the emergency room.  We have tried referring him to other hospitals without any success.  [facility] that [facility] are not taking any new patients at this time.  At this time there seems to be no benefit to continue to stay in the ER.  He is not showing any acute signs of dangerousness and while he still has some hypomanic-like symptoms he is not threatening and is agreeable to outpatient treatment.  Discontinue IVC and review with emergency room doctor and TTS.  Patient agrees to continue receiving care from his act team.  Disposition: No evidence of imminent risk to self or others at present.   Patient does not meet criteria for psychiatric inpatient admission. Supportive therapy provided about ongoing stressors. Discussed crisis plan, support from social network, calling 911, coming to the Emergency Department, and calling Suicide Hotline.  Greggory Stallion, MD 10/11/2020 12:33 PM

## 2020-10-11 NOTE — ED Provider Notes (Signed)
Emergency Medicine Observation Re-evaluation Note  Ernest Cook is a 30 y.o. male, seen on rounds today.  Pt initially presented to the ED for complaints of No chief complaint on file.  Currently, the patient is calm, no acute complaints.  Physical Exam  Blood pressure 118/74, pulse 82, temperature 98.6 F (37 C), temperature source Oral, resp. rate 19, height 5\' 11"  (1.803 m), weight 90 kg, SpO2 99 %. Physical Exam General: NAD Lungs: CTAB Psych: not agitated  ED Course / MDM  EKG:    I have reviewed the labs performed to date as well as medications administered while in observation.  Recent changes in the last 24 hours include no acute events overnight.  Seen by psychiatry today who finds the patient to be psychiatric stable, has rescinded IVC and written prescriptions for discharge.  Plan  Current plan is for discharge. Patient is not under full IVC at this time.   , MD 10/11/20 1340

## 2020-10-11 NOTE — ED Notes (Signed)
IVC/ [facility] Waitlist

## 2020-10-11 NOTE — ED Notes (Signed)
Pt asleep at this time, unable to collect vitals. Will collect pt vitals once awake. 

## 2020-10-11 NOTE — ED Notes (Signed)
Mother whom # is n chart called to be made aware that Kyen is being discharged. No answer. Patient also tried calling her but stated he did not get an answer

## 2020-10-20 ENCOUNTER — Emergency Department
Admission: EM | Admit: 2020-10-20 | Discharge: 2020-10-26 | Disposition: A | Discharge status: Home / Self Care | Payer: No Typology Code available for payment source | Attending: Emergency Medicine | Admitting: Emergency Medicine

## 2020-10-20 ENCOUNTER — Other Ambulatory Visit: Payer: Self-pay

## 2020-10-20 DIAGNOSIS — F17210 Nicotine dependence, cigarettes, uncomplicated: Secondary | ICD-10-CM | POA: Diagnosis not present

## 2020-10-20 DIAGNOSIS — Z20822 Contact with and (suspected) exposure to covid-19: Secondary | ICD-10-CM | POA: Diagnosis not present

## 2020-10-20 DIAGNOSIS — F1220 Cannabis dependence, uncomplicated: Secondary | ICD-10-CM | POA: Diagnosis present

## 2020-10-20 DIAGNOSIS — Z7982 Long term (current) use of aspirin: Secondary | ICD-10-CM | POA: Insufficient documentation

## 2020-10-20 DIAGNOSIS — R456 Violent behavior: Secondary | ICD-10-CM | POA: Diagnosis not present

## 2020-10-20 DIAGNOSIS — F29 Unspecified psychosis not due to a substance or known physiological condition: Secondary | ICD-10-CM | POA: Diagnosis present

## 2020-10-20 DIAGNOSIS — F250 Schizoaffective disorder, bipolar type: Secondary | ICD-10-CM | POA: Diagnosis present

## 2020-10-20 DIAGNOSIS — F23 Brief psychotic disorder: Secondary | ICD-10-CM

## 2020-10-20 LAB — CBC WITH DIFFERENTIAL/PLATELET
Abs Immature Granulocytes: 0.04 10*3/uL (ref 0.00–0.07)
Basophils Absolute: 0 10*3/uL (ref 0.0–0.1)
Basophils Relative: 0 %
Eosinophils Absolute: 0 10*3/uL (ref 0.0–0.5)
Eosinophils Relative: 0 %
HCT: 40.8 % (ref 39.0–52.0)
Hemoglobin: 13 g/dL (ref 13.0–17.0)
Immature Granulocytes: 0 %
Lymphocytes Relative: 26 %
Lymphs Abs: 2.8 10*3/uL (ref 0.7–4.0)
MCH: 26.3 pg (ref 26.0–34.0)
MCHC: 31.9 g/dL (ref 30.0–36.0)
MCV: 82.6 fL (ref 80.0–100.0)
Monocytes Absolute: 1.1 10*3/uL — ABNORMAL HIGH (ref 0.1–1.0)
Monocytes Relative: 10 %
Neutro Abs: 6.7 10*3/uL (ref 1.7–7.7)
Neutrophils Relative %: 64 %
Platelets: 338 10*3/uL (ref 150–400)
RBC: 4.94 MIL/uL (ref 4.22–5.81)
RDW: 14.9 % (ref 11.5–15.5)
WBC: 10.8 10*3/uL — ABNORMAL HIGH (ref 4.0–10.5)
nRBC: 0 % (ref 0.0–0.2)

## 2020-10-20 LAB — COMPREHENSIVE METABOLIC PANEL
ALT: 21 U/L (ref 0–44)
AST: 23 U/L (ref 15–41)
Albumin: 4.5 g/dL (ref 3.5–5.0)
Alkaline Phosphatase: 92 U/L (ref 38–126)
Anion gap: 9 (ref 5–15)
BUN: 13 mg/dL (ref 6–20)
CO2: 23 mmol/L (ref 22–32)
Calcium: 9.3 mg/dL (ref 8.9–10.3)
Chloride: 107 mmol/L (ref 98–111)
Creatinine, Ser: 0.86 mg/dL (ref 0.61–1.24)
GFR, Estimated: >60 mL/min (ref >60)
Glucose, Bld: 120 mg/dL — ABNORMAL HIGH (ref 70–99)
Potassium: 3.7 mmol/L (ref 3.5–5.1)
Sodium: 139 mmol/L (ref 135–145)
Total Bilirubin: 0.7 mg/dL (ref 0.3–1.2)
Total Protein: 7.4 g/dL (ref 6.5–8.1)

## 2020-10-20 LAB — SALICYLATE LEVEL: Salicylate Lvl: <7 mg/dL — ABNORMAL LOW (ref 7.0–30.0)

## 2020-10-20 LAB — RESP PANEL BY RT-PCR (FLU A&B, COVID) ARPGX2
Influenza A by PCR: NEGATIVE
Influenza B by PCR: NEGATIVE
SARS Coronavirus 2 by RT PCR: NEGATIVE

## 2020-10-20 LAB — ACETAMINOPHEN LEVEL: Acetaminophen (Tylenol), Serum: <10 ug/mL — ABNORMAL LOW (ref 10–30)

## 2020-10-20 LAB — ETHANOL: Alcohol, Ethyl (B): <10 mg/dL (ref <10)

## 2020-10-20 MED ORDER — DIPHENHYDRAMINE HCL 50 MG/ML IJ SOLN: 50.0000 mg | Freq: Once | INTRAMUSCULAR | Status: AC

## 2020-10-20 MED ORDER — LORAZEPAM 2 MG/ML IJ SOLN: 2.0000 mg | Freq: Once | INTRAMUSCULAR | Status: AC

## 2020-10-20 MED ORDER — ZIPRASIDONE MESYLATE 20 MG IM SOLR
20.0000 mg | Freq: Once | INTRAMUSCULAR | Status: AC
  Administered 2020-10-20: 20 mg via INTRAMUSCULAR

## 2020-10-20 MED ORDER — LORAZEPAM 2 MG/ML IJ SOLN
INTRAMUSCULAR | Status: AC
  Administered 2020-10-20: 2 mg via INTRAMUSCULAR
  Filled 2020-10-20: qty 1

## 2020-10-20 MED ORDER — DIPHENHYDRAMINE HCL 50 MG/ML IJ SOLN
INTRAMUSCULAR | Status: AC
  Administered 2020-10-20: 50 mg via INTRAMUSCULAR
  Filled 2020-10-20: qty 1

## 2020-10-20 NOTE — ED Notes (Addendum)
Will collect blood-work and vital signs once pt has calmed down for a sustained timeframe. Pt states SI/HI. Denies any plans currently.
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
## 2020-10-20 NOTE — BH Assessment (Signed)
Patient oriented to self only. Patient was medicated for aggressive and homicidal behavior and was unable to participate in assessment with TTS and Clinical research associate.  Will try again once patient is more alert.
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
## 2020-10-20 NOTE — BH Assessment (Signed)
Unable to assessment pt due to pt being medicated. TTS to follow up.
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
## 2020-10-20 NOTE — ED Notes (Addendum)
Alissa and Jeb NT taking vital signs and collecting covid swab now. Pt to be dressed out once less lethargic.
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
## 2020-10-21 DIAGNOSIS — F209 Schizophrenia, unspecified: Secondary | ICD-10-CM | POA: Diagnosis not present

## 2020-10-21 MED ORDER — RISPERIDONE 1 MG PO TBDP
4.0000 mg | ORAL_TABLET | Freq: Every day | ORAL | Status: DC
Start: 2020-10-21 — End: 2020-10-26
  Administered 2020-10-21 – 2020-10-24 (×3): 4 mg via ORAL
  Filled 2020-10-21 (×2): qty 2
  Filled 2020-10-21 (×3): qty 4
  Filled 2020-10-21: qty 2
  Filled 2020-10-21: qty 4
  Filled 2020-10-21: qty 2
  Filled 2020-10-21 (×2): qty 4

## 2020-10-21 MED ORDER — LITHIUM CARBONATE ER 450 MG PO TBCR
450.0000 mg | EXTENDED_RELEASE_TABLET | Freq: Two times a day (BID) | ORAL | Status: DC
Start: 2020-10-21 — End: 2020-10-26
  Administered 2020-10-21 – 2020-10-26 (×8): 450 mg via ORAL
  Filled 2020-10-21 (×10): qty 1

## 2020-10-21 MED ORDER — DIVALPROEX SODIUM 500 MG PO DR TAB
500.0000 mg | DELAYED_RELEASE_TABLET | Freq: Two times a day (BID) | ORAL | Status: DC
  Administered 2020-10-21 – 2020-10-26 (×8): 500 mg via ORAL
  Filled 2020-10-21 (×10): qty 1

## 2020-10-21 MED ORDER — OLANZAPINE 10 MG PO TBDP
20.0000 mg | ORAL_TABLET | Freq: Every day | ORAL | Status: DC
  Administered 2020-10-21 – 2020-10-24 (×3): 20 mg via ORAL
  Filled 2020-10-21 (×7): qty 2

## 2020-10-21 NOTE — ED Notes (Signed)
Report to include Situation, Background, Assessment, and Recommendations received from Ariel RN. Patient alert and oriented, warm and dry, in no acute distress. Patient denies SI, HI, AVH and pain. Patient made aware of Q15 minute rounds and security cameras for their safety. Patient instructed to come to me with needs or concerns.  

## 2020-10-21 NOTE — ED Notes (Signed)
Report given to Jennifer, RN
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
## 2020-10-21 NOTE — ED Notes (Signed)
IVC prior to arrival/ Consult Pending/ Moved to [facility]-6

## 2020-10-21 NOTE — BH Assessment (Addendum)
Referral information for Psychiatric Hospitalization faxed to;   [name] [phone]), John reports denied due to Medicaid unable to pay past a certain amount of days with patients that have chronic psychosis  . [facility] [phone]), No answer  . [name] [phone] -or- [phone]), Leighton Parody reports denied  . [place] [phone] -or- [phone]) Maxine Glenn reports patient has not yet been reviewed, check back in the morning  . [place] [phone] or [phone]) Staff reports facility is currently at capacity

## 2020-10-21 NOTE — ED Notes (Addendum)
Pt belongings include:   Wallace Cullens tank top Black pants Black underwear Pt changed out by this Tax inspector, Charity fundraiser

## 2020-10-21 NOTE — ED Notes (Signed)
Lunch meal tray placed in pt room.

## 2020-10-21 NOTE — ED Notes (Signed)
Psych team TTS and provider at bedside.
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
## 2020-10-21 NOTE — BH Assessment (Signed)
TTS attempted to complete assessment but was unsuccessful due to pt sleeping per Morrie Sheldon, Charity fundraiser.   Morrie Sheldon agreed to inform TTS when pt is awake and able to be assessed.
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
## 2020-10-22 DIAGNOSIS — F250 Schizoaffective disorder, bipolar type: Secondary | ICD-10-CM

## 2020-10-22 MED ORDER — HALOPERIDOL DECANOATE 100 MG/ML IM SOLN
100.0000 mg | Freq: Once | INTRAMUSCULAR | Status: AC
  Administered 2020-10-22: 100 mg via INTRAMUSCULAR
  Filled 2020-10-22: qty 1

## 2020-10-22 MED ORDER — RISPERIDONE MICROSPHERES ER 50 MG IM SRER
50.0000 mg | INTRAMUSCULAR | Status: DC
  Administered 2020-10-22: 50 mg via INTRAMUSCULAR
  Filled 2020-10-22: qty 2

## 2020-10-22 NOTE — ED Notes (Signed)
Hourly rounding reveals patient in room. No complaints, stable, in no acute distress. Q15 minute rounds and monitoring via Security Cameras to continue. 

## 2020-10-22 NOTE — ED Notes (Signed)
Pt given toiletries and took shower

## 2020-10-22 NOTE — BH Assessment (Signed)
TTS was contacted by Lafonda Mosses [facility]), who confirmed adding pt to the waitlist.

## 2020-10-22 NOTE — ED Notes (Signed)
Dinner box and juice given 

## 2020-10-22 NOTE — ED Notes (Signed)
VS not taken, Patient asleep. 

## 2020-10-22 NOTE — ED Notes (Signed)
IVC pending placement 

## 2020-10-22 NOTE — BH Assessment (Addendum)
Patient referred to [facility] Referral Form and supporting documentation completed and faxed to [facility]   Writer contacted [facility] [phone]) and spoke with Mr. Moise Boring who confirms that referral was received, pre-screening completed and patient to be reviewed today

## 2020-10-22 NOTE — Consult Note (Signed)
[facility] Face-to-Face Psychiatry Consult   Reason for Consult: Consult for 30 year old man with schizoaffective disorder brought back under IVC Referring Physician: Quale Patient Identification: Ernest Cook MRN:  [id number] Principal Diagnosis: Schizoaffective disorder, bipolar type (HCC) Diagnosis:  Principal Problem:   Schizoaffective disorder, bipolar type (HCC) Active Problems:   Cannabis use disorder, moderate, dependence (HCC)   Total Time spent with patient: 1 hour  Subjective:   Ernest Cook is a 30 y.o. male patient admitted with "I just got happy".  HPI: Patient seen chart reviewed.  He is unusually disorganized at this time.  Laying in bed talking in a confusing manner.  Smiling.  Rambling.  He was brought back once again with reports that he had been threatening violence against people in public in his hometown.  Patient not able right now to answer any lucid questions about anything.  Past Psychiatric History: Long history of schizoaffective disorder psychosis multiple visits to the hospital just in the past year.  Even after attempts at stabilization seems to be falling apart again and again outside the hospital but we have not been able to get him into a long-term facility  Risk to Self:   Risk to Others:   Prior Inpatient Therapy:   Prior Outpatient Therapy:    Past Medical History:  Past Medical History:  Diagnosis Date  . Schizo affective schizophrenia [facility])     Past Surgical History:  Procedure Laterality Date  . ORIF HUMERUS FRACTURE Right 06/27/2020   Procedure: OPEN REDUCTION INTERNAL FIXATION (ORIF) HUMERAL SHAFT FRACTURE;  Surgeon: Roby Lofts, MD;  Location: [facility] OR;  Service: Orthopedics;  Laterality: Right;  . SKIN GRAFT Left unknown   Pt reports left foot and leg skin graft   Family History:  Family History  Problem Relation Age of Onset  . Diabetes Mother   . Hypertension Mother    Family Psychiatric  History: See previous Social History:   Social History   Substance and Sexual Activity  Alcohol Use No     Social History   Substance and Sexual Activity  Drug Use Yes  . Types: Marijuana   Comment: "every now and again"    Social History   Socioeconomic History  . Marital status: Single    Spouse name: Not on file  . Number of children: Not on file  . Years of education: Not on file  . Highest education level: Not on file  Occupational History  . Not on file  Tobacco Use  . Smoking status: Current Every Day Smoker    Packs/day: 0.25    Types: Cigarettes  . Smokeless tobacco: Never Used  Substance and Sexual Activity  . Alcohol use: No  . Drug use: Yes    Types: Marijuana    Comment: "every now and again"  . Sexual activity: Yes    Birth control/protection: Condom    Comment: Pt reports he is attracted to men  Other Topics Concern  . Not on file  Social History Narrative  . Not on file   Social Determinants of Health   Financial Resource Strain: Not on file  Food Insecurity: Not on file  Transportation Needs: Not on file  Physical Activity: Not on file  Stress: Not on file  Social Connections: Not on file   Additional Social History:    Allergies:   Allergies  Allergen Reactions  . Penicillins Other (See Comments)    Seizure when he was 19    Labs:  Results for  orders placed or performed during the hospital encounter of 10/20/20 (from the past 48 hour(s))  Comprehensive metabolic panel     Status: Abnormal   Collection Time: 10/20/20  9:09 PM  Result Value Ref Range   Sodium 139 135 - 145 mmol/L   Potassium 3.7 3.5 - 5.1 mmol/L   Chloride 107 98 - 111 mmol/L   CO2 23 22 - 32 mmol/L   Glucose, Bld 120 (H) 70 - 99 mg/dL    Comment: Glucose reference range applies only to samples taken after fasting for at least 8 hours.   BUN 13 6 - 20 mg/dL   Creatinine, Ser 0.45 0.61 - 1.24 mg/dL   Calcium 9.3 8.9 - 40.9 mg/dL   Total Protein 7.4 6.5 - 8.1 g/dL   Albumin 4.5 3.5 - 5.0 g/dL   AST  23 15 - 41 U/L   ALT 21 0 - 44 U/L   Alkaline Phosphatase 92 38 - 126 U/L   Total Bilirubin 0.7 0.3 - 1.2 mg/dL   GFR, Estimated >81 >19 mL/min    Comment: (NOTE) Calculated using the CKD-EPI Creatinine Equation (2021)    Anion gap 9 5 - 15    Comment: Performed at [facility], [address] [place], [place] [address]  Ethanol     Status: None   Collection Time: 10/20/20  9:09 PM  Result Value Ref Range   Alcohol, Ethyl (B) <10 <10 mg/dL    Comment: (NOTE) Lowest detectable limit for serum alcohol is 10 mg/dL.  For medical purposes only. Performed at [facility], [address] [place], [place] [address]   Salicylate level     Status: Abnormal   Collection Time: 10/20/20  9:09 PM  Result Value Ref Range   Salicylate Lvl <7.0 (L) 7.0 - 30.0 mg/dL    Comment: Performed at [facility], [address] [place], [place] [address]  Acetaminophen level     Status: Abnormal   Collection Time: 10/20/20  9:09 PM  Result Value Ref Range   Acetaminophen (Tylenol), Serum <10 (L) 10 - 30 ug/mL    Comment: (NOTE) Therapeutic concentrations vary significantly. A range of 10-30 ug/mL  may be an effective concentration for many patients. However, some  are best treated at concentrations outside of this range. Acetaminophen concentrations >150 ug/mL at 4 hours after ingestion  and >50 ug/mL at 12 hours after ingestion are often associated with  toxic reactions.  Performed at [facility], [address] [place], [place] [address]   CBC with Differential     Status: Abnormal   Collection Time: 10/20/20  9:09 PM  Result Value Ref Range   WBC 10.8 (H) 4.0 - 10.5 K/uL   RBC 4.94 4.22 - 5.81 MIL/uL   Hemoglobin 13.0 13.0 - 17.0 g/dL   HCT 62.9 52.8 - 41.3 %   MCV 82.6 80.0 - 100.0 fL   MCH 26.3 26.0 - 34.0 pg   MCHC 31.9 30.0 - 36.0 g/dL   RDW 24.4 01.0 - 27.2 %   Platelets 338 150 - 400 K/uL   nRBC 0.0 0.0 - 0.2 %   Neutrophils Relative %  64 %   Neutro Abs 6.7 1.7 - 7.7 K/uL   Lymphocytes Relative 26 %   Lymphs Abs 2.8 0.7 - 4.0 K/uL   Monocytes Relative 10 %   Monocytes Absolute 1.1 (H) 0.1 - 1.0 K/uL   Eosinophils Relative 0 %   Eosinophils Absolute 0.0 0.0 -  0.5 K/uL   Basophils Relative 0 %   Basophils Absolute 0.0 0.0 - 0.1 K/uL   Immature Granulocytes 0 %   Abs Immature Granulocytes 0.04 0.00 - 0.07 K/uL    Comment: Performed at [facility], [address], [place], [place] [address]  Resp Panel by RT-PCR (Flu A&B, Covid) Nasopharyngeal Swab     Status: None   Collection Time: 10/20/20  9:22 PM   Specimen: Nasopharyngeal Swab; Nasopharyngeal(NP) swabs in vial transport medium  Result Value Ref Range   SARS Coronavirus 2 by RT PCR NEGATIVE NEGATIVE    Comment: (NOTE) SARS-CoV-2 target nucleic acids are NOT DETECTED.  The SARS-CoV-2 RNA is generally detectable in upper respiratory specimens during the acute phase of infection. The lowest concentration of SARS-CoV-2 viral copies this assay can detect is 138 copies/mL. A negative result does not preclude SARS-Cov-2 infection and should not be used as the sole basis for treatment or other patient management decisions. A negative result may occur with  improper specimen collection/handling, submission of specimen other than nasopharyngeal swab, presence of viral mutation(s) within the areas targeted by this assay, and inadequate number of viral copies(<138 copies/mL). A negative result must be combined with clinical observations, patient history, and epidemiological information. The expected result is Negative.  Fact Sheet for Patients:  BloggerCourse.com  Fact Sheet for Healthcare Providers:  SeriousBroker.it  This test is no t yet approved or cleared by the [place] FDA and  has been authorized for detection and/or diagnosis of SARS-CoV-2 by FDA under an Emergency Use Authorization (EUA). This  EUA will remain  in effect (meaning this test can be used) for the duration of the COVID-19 declaration under Section 564(b)(1) of the Act, 21 U.S.C.section 360bbb-3(b)(1), unless the authorization is terminated  or revoked sooner.       Influenza A by PCR NEGATIVE NEGATIVE   Influenza B by PCR NEGATIVE NEGATIVE    Comment: (NOTE) The Xpert Xpress SARS-CoV-2/FLU/RSV plus assay is intended as an aid in the diagnosis of influenza from Nasopharyngeal swab specimens and should not be used as a sole basis for treatment. Nasal washings and aspirates are unacceptable for Xpert Xpress SARS-CoV-2/FLU/RSV testing.  Fact Sheet for Patients: BloggerCourse.com  Fact Sheet for Healthcare Providers: SeriousBroker.it  This test is not yet approved or cleared by the [place] FDA and has been authorized for detection and/or diagnosis of SARS-CoV-2 by FDA under an Emergency Use Authorization (EUA). This EUA will remain in effect (meaning this test can be used) for the duration of the COVID-19 declaration under Section 564(b)(1) of the Act, 21 U.S.C. section 360bbb-3(b)(1), unless the authorization is terminated or revoked.  Performed at [facility], [address] [place], [place] [address]     Current Facility-Administered Medications  Medication Dose Route Frequency Provider Last Rate Last Admin  . divalproex (DEPAKOTE) DR tablet 500 mg  500 mg Oral Q12H Jesse Sans, MD   500 mg at 10/22/20 1106  . haloperidol decanoate (HALDOL DECANOATE) 100 MG/ML injection 100 mg  100 mg Intramuscular Once Zakyah Yanes T, MD      . lithium carbonate (ESKALITH) CR tablet 450 mg  450 mg Oral Q12H Jesse Sans, MD   450 mg at 10/22/20 1106  . OLANZapine zydis (ZYPREXA) disintegrating tablet 20 mg  20 mg Oral QHS Jesse Sans, MD   20 mg at 10/21/20 2210  . risperiDONE (RISPERDAL M-TABS) disintegrating tablet 4 mg  4 mg Oral QHS  Neale Burly,  Tylene FantasiaMegan M, MD   4 mg at 10/21/20 2210  . risperiDONE microspheres (RISPERDAL CONSTA) injection 50 mg  50 mg Intramuscular Q14 Days Jere Bostrom, Jackquline DenmarkJohn T, MD       Current Outpatient Medications  Medication Sig Dispense Refill  . aspirin EC 81 MG EC tablet Take 1 tablet (81 mg total) by mouth daily. Swallow whole. 30 tablet 1  . atomoxetine (STRATTERA) 40 MG capsule Take 1 capsule (40 mg total) by mouth every morning. 30 capsule 1  . benztropine (COGENTIN) 1 MG tablet Take 1 tablet (1 mg total) by mouth at bedtime. 30 tablet 1  . divalproex (DEPAKOTE) 500 MG DR tablet Take 1 tablet (500 mg total) by mouth every 12 (twelve) hours. 60 tablet 0  . haloperidol (HALDOL) 10 MG tablet Take 10 mg by mouth at bedtime.    Marland Kitchen. lithium carbonate (ESKALITH) 450 MG CR tablet Take 1 tablet (450 mg total) by mouth every 12 (twelve) hours. 60 tablet 0  . methocarbamol (ROBAXIN) 500 MG tablet Take 1 tablet (500 mg total) by mouth every 6 (six) hours as needed for muscle spasms. 60 tablet 1  . OLANZapine zydis (ZYPREXA) 20 MG disintegrating tablet Take 1 tablet (20 mg total) by mouth at bedtime. 30 tablet 0  . risperiDONE (RISPERDAL M-TABS) 4 MG disintegrating tablet Take 1 tablet (4 mg total) by mouth at bedtime. 30 tablet 0  . risperiDONE microspheres (RISPERDAL CONSTA) 50 MG injection Inject 2 mLs (50 mg total) into the muscle every 14 (fourteen) days. 1 each 1  . Vitamin D3 (VITAMIN D) 25 MCG tablet Take 2 tablets (2,000 Units total) by mouth daily. 60 tablet 1    Musculoskeletal: Strength & Muscle Tone: within normal limits Gait & Station: normal Patient leans: N/A  Psychiatric Specialty Exam: Physical Exam Vitals and nursing note reviewed.  Constitutional:      Appearance: He is well-developed and well-nourished.  HENT:     Head: Normocephalic and atraumatic.  Eyes:     Conjunctiva/sclera: Conjunctivae normal.     Pupils: Pupils are equal, round, and reactive to light.  Cardiovascular:     Heart  sounds: Normal heart sounds.  Pulmonary:     Effort: Pulmonary effort is normal.  Abdominal:     Palpations: Abdomen is soft.  Musculoskeletal:        General: Normal range of motion.     Cervical back: Normal range of motion.  Skin:    General: Skin is warm and dry.  Neurological:     General: No focal deficit present.     Mental Status: He is alert.  Psychiatric:        Attention and Perception: He is inattentive.        Mood and Affect: Affect is inappropriate.        Speech: Speech is tangential.        Behavior: Behavior is not aggressive or hyperactive.        Thought Content: Thought content is delusional.        Cognition and Memory: Cognition is impaired.        Judgment: Judgment is impulsive.     Review of Systems  Constitutional: Negative.   HENT: Negative.   Eyes: Negative.   Respiratory: Negative.   Cardiovascular: Negative.   Gastrointestinal: Negative.   Musculoskeletal: Negative.   Skin: Negative.   Neurological: Negative.     Blood pressure 110/71, pulse 84, temperature 98 F (36.7 C), temperature source Oral, resp. rate 18, height  5\' 10"  (1.778 m), weight 89.8 kg, SpO2 99 %.Body mass index is 28.41 kg/m.  General Appearance: Casual  Eye Contact:  Minimal  Speech:  Garbled  Volume:  Normal  Mood:  Euthymic  Affect:  Constricted  Thought Process:  Disorganized  Orientation:  Full (Time, Place, and Person)  Thought Content:  Illogical, Paranoid Ideation and Rumination  Suicidal Thoughts:  No  Homicidal Thoughts:  No  Memory:  Immediate;   Fair Recent;   Poor Remote;   Poor  Judgement:  Impaired  Insight:  Shallow  Psychomotor Activity:  Decreased  Concentration:  Concentration: Poor  Recall:  Poor  Fund of Knowledge:  Poor  Language:  Poor  Akathisia:  No  Handed:  Right  AIMS (if indicated):     Assets:  Physical Health  ADL's:  Impaired  Cognition:  Impaired,  Mild  Sleep:        Treatment Plan Summary: Plan Honestly do not know  what we are going to do for this gentleman.  We have tried admitting him to our hospital, we have tried admitting him to other hospitals, we have tried treating him aggressively in the emergency room and then discharging him and every time he keeps coming back.  [facility] not likely to be taking any [facility] anytime soon.  I am restarting his medication including trying to get him started back on both Risperdal and Haldol injections.  If his behavior here stabilizes we can consider possible admission.  Disposition: Recommend psychiatric Inpatient admission when medically cleared. Supportive therapy provided about ongoing stressors.  , MD 10/22/2020 4:26 PM

## 2020-10-23 NOTE — ED Notes (Signed)
Meal tray given 

## 2020-10-23 NOTE — ED Notes (Signed)
VS will be taken when patient wakes.  

## 2020-10-23 NOTE — ED Notes (Signed)
Patient was agreeable to taking PO medications.

## 2020-10-23 NOTE — ED Notes (Signed)
Pt allowed to use the phone,

## 2020-10-23 NOTE — ED Notes (Signed)
IVC/Pending Placement 

## 2020-10-23 NOTE — ED Notes (Signed)
Pt. Alert and oriented, warm and dry, in no distress. Pt. Denies SI, HI, and AVH. Pt. Encouraged to let nursing staff know of any concerns or needs.   ENVIRONMENTAL ASSESSMENT Potentially harmful objects out of patient reach: Yes.   Personal belongings secured: Yes.   Patient dressed in hospital provided attire only: Yes.   Plastic bags out of patient reach: Yes.   Patient care equipment (cords, cables, call bells, lines, and drains) shortened, removed, or accounted for: Yes.   Equipment and supplies removed from bottom of stretcher: Yes.   Potentially toxic materials out of patient reach: Yes.   Sharps container removed or out of patient reach: Yes.     Sandwich and soft drink given.   

## 2020-10-23 NOTE — BH Assessment (Signed)
Writer contacted [facility] [phone]) and spoke with Judeth Cornfield who confirms that patient is on the waitlist

## 2020-10-23 NOTE — ED Notes (Signed)
Pm medications offered  - pt states  "I just took two shots before you got here - why do I have to take pills too.  What are y'all trying to do -  kill me?  That man said that the shots were good for 14 days so I will see about taking some pills in 14 days.  I do not know why y'all brought me back here - I have not done anything"  Pt reassured  Assessment completed  He denies pain

## 2020-10-24 NOTE — ED Notes (Signed)
Pt reports that he enjoys his room and being in the quiet. States that it is helpful for him at this time. Pt is in good mood now, smiling and communicating with staff. Pt is in bed and is reported to have laid in bed all day.

## 2020-10-24 NOTE — ED Notes (Signed)
Hourly rounding completed at this time, patient currently awake in room. No complaints, stable, and in no acute distress. Q15 minute rounds and monitoring via Security Cameras to continue. 

## 2020-10-24 NOTE — ED Notes (Signed)
Meal tray given 

## 2020-10-24 NOTE — ED Notes (Signed)
Hourly rounding completed at this time, patient currently asleep in room. No complaints, stable, and in no acute distress. Q15 minute rounds and monitoring via Security Cameras to continue. 

## 2020-10-24 NOTE — ED Notes (Signed)
Pt sleeping. VS will be taken once awake. 

## 2020-10-24 NOTE — ED Provider Notes (Signed)
Emergency Medicine Observation Re-evaluation Note  Ernest Cook is a 30 y.o. male, initially presented to the ED for complaints of psychiatric evaluation.  No acute events overnight.  Physical Exam  BP 135/86   Pulse 86   Temp 98.2 F (36.8 C) (Oral)   Resp 18   Ht 5\' 10"  (1.778 m)   Wt 89.8 kg   SpO2 100%   BMI 28.41 kg/m    ED Course / MDM  EKG:    No new lab work performed over the past 24 hours.  Plan  Current plan is for placement into a psychiatric facility. Patient is under full IVC at this time.   , MD 10/24/20 [phone]

## 2020-10-24 NOTE — ED Notes (Signed)
IVC/Pending placement 

## 2020-10-24 NOTE — ED Notes (Signed)
Report received from Amy, RN including  Situation, Background, Assessment, and Recommendations. Patient alert and oriented, warm and dry, in no acute distress. Patient denies SI, HI, AVH and pain. Patient made aware of Q15 minute rounds and security cameras for their safety. Patient instructed to come to this nurse with needs or concerns. 

## 2020-10-24 NOTE — BH Assessment (Signed)
Writer contacted [facility] [phone]) and spoke with Pam who reports that patient remains on the waitlist

## 2020-10-24 NOTE — ED Notes (Signed)
Pt is now irritated and unhappy at this time. Remains withdrawn and laying in bed with little movement.

## 2020-10-24 NOTE — ED Notes (Signed)
Pt allowed to use the phone. 

## 2020-10-24 NOTE — ED Notes (Signed)
Pt compliant with medications and VS, but irritable.   Pt has bizarre affect, laughing inappropriately.

## 2020-10-24 NOTE — ED Notes (Signed)
Pt given ginger ale.

## 2020-10-25 NOTE — BH Assessment (Signed)
Received phone call from [facility] ([phone]), patient remains on wait list.

## 2020-10-25 NOTE — ED Provider Notes (Signed)
Emergency Medicine Observation Re-evaluation Note  Ernest Cook is a 30 y.o. male, seen on rounds today.  Pt initially presented to the ED for complaints of Homicidal Currently, the patient is calm, in NAD.  Physical Exam  BP 124/86   Pulse 94   Temp 97.8 F (36.6 C) (Oral)   Resp 18   Ht 5\' 10"  (1.778 m)   Wt 89.8 kg   SpO2 100%   BMI 28.41 kg/m  Physical Exam General: Calm Cardiac: Perfused Lungs: Normal WOB, no resp distress Psych: Calm  ED Course / MDM  EKG:    I have reviewed the labs performed to date as well as medications administered while in observation.  Recent changes in the last 24 hours include none.  Plan  Current plan is for psych dispo. Patient is under full IVC at this time.   , MD 10/25/20 1400

## 2020-10-25 NOTE — ED Notes (Signed)
Breakfast meal tray given at this time.  

## 2020-10-25 NOTE — ED Notes (Signed)
IVC ON  [facility]  WAITLIST

## 2020-10-25 NOTE — ED Notes (Signed)
Hourly rounding completed at this time, patient currently asleep in room. No complaints, stable, and in no acute distress. Q15 minute rounds and monitoring via Security Cameras to continue. 

## 2020-10-25 NOTE — ED Notes (Signed)
Pt also refuses snack now but ginger ale was left for pt beside bed

## 2020-10-25 NOTE — ED Notes (Signed)
Pt calm and cooperative at this time. Allowing this RN and Caitlyn, NT to obtain V/S and give morning medications.

## 2020-10-25 NOTE — ED Notes (Signed)
Pt asleep at this time, unable to collect vitals. Will collect pt vitals once awake. 

## 2020-10-25 NOTE — ED Notes (Signed)
Dinner meal tray given.  

## 2020-10-25 NOTE — ED Notes (Signed)
Pt woken up to be given medications. Pt states that he is not taking tonight and would tomorrow. See note in med refusal.

## 2020-10-25 NOTE — ED Notes (Signed)
Report received from [place], [place] including situation, background, assessment and recommendations. Patient sleeping, respirations regular and unlabored. Q15 minute rounds and security camera observation to continue. Will assess patient once awake.

## 2020-10-25 NOTE — ED Notes (Signed)
Lunch tray and grape juice given to pt at this time.

## 2020-10-26 MED ORDER — HALOPERIDOL DECANOATE 100 MG/ML IM SOLN: 100.0000 mg | INTRAMUSCULAR | 1 refills | Status: DC

## 2020-10-26 MED ORDER — LITHIUM CARBONATE ER 450 MG PO TBCR: 450.0000 mg | EXTENDED_RELEASE_TABLET | Freq: Two times a day (BID) | ORAL | 1 refills | Status: DC

## 2020-10-26 MED ORDER — RISPERIDONE MICROSPHERES ER 50 MG IM SRER: 50.0000 mg | INTRAMUSCULAR | 1 refills | Status: DC

## 2020-10-26 MED ORDER — RISPERIDONE 4 MG PO TBDP: 4.0000 mg | ORAL_TABLET | Freq: Every day | ORAL | 1 refills | Status: DC

## 2020-10-26 MED ORDER — DIVALPROEX SODIUM 500 MG PO DR TAB: 500.0000 mg | DELAYED_RELEASE_TABLET | Freq: Two times a day (BID) | ORAL | 1 refills | Status: DC

## 2020-10-26 MED ORDER — OLANZAPINE 20 MG PO TBDP: 20.0000 mg | ORAL_TABLET | Freq: Every day | ORAL | 1 refills | Status: DC

## 2020-10-26 NOTE — ED Notes (Signed)
Hourly rounding completed at this time, patient currently asleep in room. No complaints, stable, and in no acute distress. Q15 minute rounds and monitoring via Security Cameras to continue. 

## 2020-10-26 NOTE — ED Notes (Signed)
Pt asleep at this time, unable to collect vitals. Will collect pt vitals once awake. 

## 2020-10-26 NOTE — ED Provider Notes (Signed)
Patient cleared for d/c by Dr. Luz Lex, MD 10/26/20 [phone]

## 2020-10-26 NOTE — Consult Note (Signed)
[facility] Face-to-Face Psychiatry Consult   Reason for Consult: Follow-up consult for 30 year old man with schizoaffective disorder Referring Physician: Cyril Loosen Patient Identification: Ernest Cook MRN:  [id number] Principal Diagnosis: Schizoaffective disorder, bipolar type (HCC) Diagnosis:  Principal Problem:   Schizoaffective disorder, bipolar type (HCC) Active Problems:   Cannabis use disorder, moderate, dependence (HCC)   Total Time spent with patient: 30 minutes  Subjective:   Ernest Cook is a 30 y.o. male patient admitted with "I do not even know what I did".  HPI: Patient seen for follow-up.  30 year old man with schizoaffective disorder has been in the emergency room for nearly a week.  Patient has not been any behavior problem here.  Has not been aggressive not been threatening or violent and denies suicidal ideation.  Patient has received long-acting injectable shots of both Risperdal and Haldol and is taking 2 mood stabilizers.  He denies any suicidal or homicidal thoughts.  Does not show any actively dangerous behaviors or paranoia.  Past Psychiatric History: Patient has a history of recurrent spells of agitation in public  Risk to Self:   Risk to Others:   Prior Inpatient Therapy:   Prior Outpatient Therapy:    Past Medical History:  Past Medical History:  Diagnosis Date  . Schizo affective schizophrenia [facility])     Past Surgical History:  Procedure Laterality Date  . ORIF HUMERUS FRACTURE Right 06/27/2020   Procedure: OPEN REDUCTION INTERNAL FIXATION (ORIF) HUMERAL SHAFT FRACTURE;  Surgeon: Roby Lofts, MD;  Location: [facility] OR;  Service: Orthopedics;  Laterality: Right;  . SKIN GRAFT Left unknown   Pt reports left foot and leg skin graft   Family History:  Family History  Problem Relation Age of Onset  . Diabetes Mother   . Hypertension Mother    Family Psychiatric  History: See previous Social History:  Social History   Substance and Sexual Activity   Alcohol Use No     Social History   Substance and Sexual Activity  Drug Use Yes  . Types: Marijuana   Comment: "every now and again"    Social History   Socioeconomic History  . Marital status: Single    Spouse name: Not on file  . Number of children: Not on file  . Years of education: Not on file  . Highest education level: Not on file  Occupational History  . Not on file  Tobacco Use  . Smoking status: Current Every Day Smoker    Packs/day: 0.25    Types: Cigarettes  . Smokeless tobacco: Never Used  Substance and Sexual Activity  . Alcohol use: No  . Drug use: Yes    Types: Marijuana    Comment: "every now and again"  . Sexual activity: Yes    Birth control/protection: Condom    Comment: Pt reports he is attracted to men  Other Topics Concern  . Not on file  Social History Narrative  . Not on file   Social Determinants of Health   Financial Resource Strain: Not on file  Food Insecurity: Not on file  Transportation Needs: Not on file  Physical Activity: Not on file  Stress: Not on file  Social Connections: Not on file   Additional Social History:    Allergies:   Allergies  Allergen Reactions  . Penicillins Other (See Comments)    Seizure when he was 19    Labs: No results found for this or any previous visit (from the past 48 hour(s)).  Current Facility-Administered Medications  Medication Dose Route Frequency Provider Last Rate Last Admin  . divalproex (DEPAKOTE) DR tablet 500 mg  500 mg Oral Q12H Jesse Sans, MD   500 mg at 10/26/20 1142  . lithium carbonate (ESKALITH) CR tablet 450 mg  450 mg Oral Q12H Jesse Sans, MD   450 mg at 10/26/20 1141  . OLANZapine zydis (ZYPREXA) disintegrating tablet 20 mg  20 mg Oral QHS Jesse Sans, MD   20 mg at 10/24/20 2108  . risperiDONE (RISPERDAL M-TABS) disintegrating tablet 4 mg  4 mg Oral QHS Jesse Sans, MD   4 mg at 10/24/20 2110  . risperiDONE microspheres (RISPERDAL CONSTA) injection  50 mg  50 mg Intramuscular Q14 Days Britiany Silbernagel, Jackquline Denmark, MD   50 mg at 10/22/20 1902   Current Outpatient Medications  Medication Sig Dispense Refill  . aspirin EC 81 MG EC tablet Take 1 tablet (81 mg total) by mouth daily. Swallow whole. 30 tablet 1  . atomoxetine (STRATTERA) 40 MG capsule Take 1 capsule (40 mg total) by mouth every morning. 30 capsule 1  . benztropine (COGENTIN) 1 MG tablet Take 1 tablet (1 mg total) by mouth at bedtime. 30 tablet 1  . divalproex (DEPAKOTE) 500 MG DR tablet Take 1 tablet (500 mg total) by mouth every 12 (twelve) hours. 60 tablet 0  . haloperidol (HALDOL) 10 MG tablet Take 10 mg by mouth at bedtime.    Marland Kitchen lithium carbonate (ESKALITH) 450 MG CR tablet Take 1 tablet (450 mg total) by mouth every 12 (twelve) hours. 60 tablet 0  . methocarbamol (ROBAXIN) 500 MG tablet Take 1 tablet (500 mg total) by mouth every 6 (six) hours as needed for muscle spasms. 60 tablet 1  . OLANZapine zydis (ZYPREXA) 20 MG disintegrating tablet Take 1 tablet (20 mg total) by mouth at bedtime. 30 tablet 0  . risperiDONE (RISPERDAL M-TABS) 4 MG disintegrating tablet Take 1 tablet (4 mg total) by mouth at bedtime. 30 tablet 0  . risperiDONE microspheres (RISPERDAL CONSTA) 50 MG injection Inject 2 mLs (50 mg total) into the muscle every 14 (fourteen) days. 1 each 1  . Vitamin D3 (VITAMIN D) 25 MCG tablet Take 2 tablets (2,000 Units total) by mouth daily. 60 tablet 1    Musculoskeletal: Strength & Muscle Tone: within normal limits Gait & Station: normal Patient leans: N/A            Psychiatric Specialty Exam:  Presentation  General Appearance: No data recorded Eye Contact:No data recorded Speech:No data recorded Speech Volume:No data recorded Handedness:No data recorded  Mood and Affect  Mood:No data recorded Affect:No data recorded  Thought Process  Thought Processes:No data recorded Descriptions of Associations:No data recorded Orientation:No data recorded Thought  Content:No data recorded History of Schizophrenia/Schizoaffective disorder:Yes  Duration of Psychotic Symptoms:Less than six months  Hallucinations:No data recorded Ideas of Reference:No data recorded Suicidal Thoughts:No data recorded Homicidal Thoughts:No data recorded  Sensorium  Memory:No data recorded Judgment:No data recorded Insight:No data recorded  Executive Functions  Concentration:No data recorded Attention Span:No data recorded Recall:No data recorded Fund of Knowledge:No data recorded Language:No data recorded  Psychomotor Activity  Psychomotor Activity:No data recorded  Assets  Assets:No data recorded  Sleep  Sleep:No data recorded  Physical Exam: Physical Exam Vitals and nursing note reviewed.  Constitutional:      Appearance: Normal appearance.  HENT:     Head: Normocephalic and atraumatic.     Mouth/Throat:     Pharynx: Oropharynx is clear.  Eyes:     Pupils: Pupils are equal, round, and reactive to light.  Cardiovascular:     Rate and Rhythm: Normal rate and regular rhythm.  Pulmonary:     Effort: Pulmonary effort is normal.     Breath sounds: Normal breath sounds.  Abdominal:     General: Abdomen is flat.     Palpations: Abdomen is soft.  Musculoskeletal:        General: Normal range of motion.  Skin:    General: Skin is warm and dry.  Neurological:     General: No focal deficit present.     Mental Status: He is alert. Mental status is at baseline.  Psychiatric:        Mood and Affect: Mood normal.        Thought Content: Thought content normal.    Review of Systems  Constitutional: Negative.   HENT: Negative.   Eyes: Negative.   Respiratory: Negative.   Cardiovascular: Negative.   Gastrointestinal: Negative.   Musculoskeletal: Negative.   Skin: Negative.   Neurological: Negative.   Psychiatric/Behavioral: Negative.    Blood pressure 117/81, pulse 81, temperature 98 F (36.7 C), temperature source Oral, resp. rate 16,  height 5\' 10"  (1.778 m), weight 89.8 kg, SpO2 99 %. Body mass index is 28.41 kg/m.  Treatment Plan Summary: Plan Patient is going to be discharged today back to his outpatient treatment.  He has an act team and a place to live.  Counseling is always about medicine compliance and the importance of trying to keep his behavior calm and under control in public.  Case reviewed with TTS and emergency room physician.  Discontinue IVC.  Prescriptions will be provided at discharge.  Disposition: No evidence of imminent risk to self or others at present.   Patient does not meet criteria for psychiatric inpatient admission. Supportive therapy provided about ongoing stressors. Discussed crisis plan, support from social network, calling 911, coming to the Emergency Department, and calling Suicide Hotline.  , MD 10/26/2020 3:22 PM

## 2020-10-26 NOTE — ED Notes (Signed)
IVC/Pending placement 

## 2020-10-26 NOTE — ED Provider Notes (Signed)
Emergency Medicine Observation Re-evaluation Note  Ernest Cook is a 30 y.o. male, seen on rounds today.  Pt initially presented to the ED for complaints of Homicidal Currently, the patient is sitting in chair in common area  Physical Exam  BP 110/89   Pulse 89   Temp 98 F (36.7 C)   Resp 20   Ht 1.778 m (5\' 10" )   Wt 89.8 kg   SpO2 99%   BMI 28.41 kg/m  Physical Exam General: NAD  Lungs: no increased work of breathing Psych: calm and cooperative with me, no aggressive behavior  ED Course / MDM  EKG:    I have reviewed the labs performed to date as well as medications administered while in observation.  No changes overnight  Plan  Patient remains on [facility] wait list. Patient is under full IVC at this time.   [facility], MD 10/26/20 [phone]

## 2020-10-26 NOTE — ED Notes (Signed)
Pt given phone to call for ride.

## 2020-11-06 ENCOUNTER — Other Ambulatory Visit: Payer: Self-pay

## 2020-11-06 ENCOUNTER — Encounter: Payer: Self-pay | Admitting: Emergency Medicine

## 2020-11-06 ENCOUNTER — Emergency Department
Admission: EM | Admit: 2020-11-06 | Discharge: 2020-11-07 | Payer: No Typology Code available for payment source | Attending: Emergency Medicine | Admitting: Emergency Medicine

## 2020-11-06 DIAGNOSIS — Z046 Encounter for general psychiatric examination, requested by authority: Secondary | ICD-10-CM | POA: Insufficient documentation

## 2020-11-06 DIAGNOSIS — Z20822 Contact with and (suspected) exposure to covid-19: Secondary | ICD-10-CM | POA: Diagnosis not present

## 2020-11-06 DIAGNOSIS — Z7982 Long term (current) use of aspirin: Secondary | ICD-10-CM | POA: Insufficient documentation

## 2020-11-06 DIAGNOSIS — F250 Schizoaffective disorder, bipolar type: Secondary | ICD-10-CM | POA: Diagnosis not present

## 2020-11-06 DIAGNOSIS — F17210 Nicotine dependence, cigarettes, uncomplicated: Secondary | ICD-10-CM | POA: Diagnosis not present

## 2020-11-06 DIAGNOSIS — R456 Violent behavior: Secondary | ICD-10-CM | POA: Diagnosis not present

## 2020-11-06 DIAGNOSIS — R4689 Other symptoms and signs involving appearance and behavior: Secondary | ICD-10-CM

## 2020-11-06 DIAGNOSIS — F17200 Nicotine dependence, unspecified, uncomplicated: Secondary | ICD-10-CM | POA: Diagnosis present

## 2020-11-06 DIAGNOSIS — F259 Schizoaffective disorder, unspecified: Secondary | ICD-10-CM | POA: Diagnosis present

## 2020-11-06 DIAGNOSIS — F23 Brief psychotic disorder: Secondary | ICD-10-CM | POA: Insufficient documentation

## 2020-11-06 LAB — RESP PANEL BY RT-PCR (FLU A&B, COVID) ARPGX2
Influenza A by PCR: NEGATIVE
Influenza B by PCR: NEGATIVE
SARS Coronavirus 2 by RT PCR: NEGATIVE

## 2020-11-06 MED ORDER — OLANZAPINE 10 MG PO TBDP
20.0000 mg | ORAL_TABLET | Freq: Every day | ORAL | Status: DC
  Administered 2020-11-06: 20 mg via ORAL
  Filled 2020-11-06 (×2): qty 2

## 2020-11-06 MED ORDER — RISPERIDONE MICROSPHERES ER 50 MG IM SRER
50.0000 mg | INTRAMUSCULAR | Status: DC
  Administered 2020-11-06: 50 mg via INTRAMUSCULAR
  Filled 2020-11-06: qty 2

## 2020-11-06 MED ORDER — LORAZEPAM 2 MG/ML IJ SOLN
INTRAMUSCULAR | Status: AC
  Administered 2020-11-06: 2 mg via INTRAMUSCULAR
  Filled 2020-11-06: qty 1

## 2020-11-06 MED ORDER — LORAZEPAM 2 MG/ML IJ SOLN: 2.0000 mg | Freq: Once | INTRAMUSCULAR | Status: AC

## 2020-11-06 MED ORDER — LITHIUM CARBONATE ER 450 MG PO TBCR
450.0000 mg | EXTENDED_RELEASE_TABLET | Freq: Two times a day (BID) | ORAL | Status: DC
  Administered 2020-11-06: 450 mg via ORAL
  Filled 2020-11-06: qty 1

## 2020-11-06 MED ORDER — DIVALPROEX SODIUM 500 MG PO DR TAB
500.0000 mg | DELAYED_RELEASE_TABLET | Freq: Two times a day (BID) | ORAL | Status: DC
  Administered 2020-11-06: 500 mg via ORAL
  Filled 2020-11-06: qty 1

## 2020-11-06 MED ORDER — DIPHENHYDRAMINE HCL 50 MG/ML IJ SOLN
INTRAMUSCULAR | Status: AC
  Administered 2020-11-06: 50 mg via INTRAVENOUS
  Filled 2020-11-06: qty 1

## 2020-11-06 MED ORDER — HALOPERIDOL LACTATE 5 MG/ML IJ SOLN
INTRAMUSCULAR | Status: AC
  Administered 2020-11-06: 5 mg via INTRAMUSCULAR
  Filled 2020-11-06: qty 1

## 2020-11-06 MED ORDER — DIPHENHYDRAMINE HCL 50 MG/ML IJ SOLN: 50.0000 mg | Freq: Once | INTRAMUSCULAR | Status: AC

## 2020-11-06 MED ORDER — HALOPERIDOL LACTATE 5 MG/ML IJ SOLN: 5.0000 mg | Freq: Once | INTRAMUSCULAR | Status: AC

## 2020-11-06 NOTE — ED Notes (Signed)
IVC / Consult completed/ Pending Placement 

## 2020-11-06 NOTE — ED Notes (Signed)
Pt refusing blood work. MD Bradler aware at this time. Patient moved to [facility] room 8.

## 2020-11-06 NOTE — ED Notes (Addendum)
Pt has been sleeping soundly.  VS will be taken when awake.  Pt has unlabored, even respirations and can be heard snoring.

## 2020-11-06 NOTE — BH Assessment (Signed)
Patient has been accepted to [facility].  Patient assigned to [facility] Accepting physician is Dr. Estill Cotta.  Call report to [phone].  Representative was [place].   ER Staff is aware of it:  Inetta Fermo, ER Secretary  Dr. Vicente Males, ER MD  Cala Bradford, Patient's Nurse     Patient can arrive anytime to campus after 8 AM.

## 2020-11-06 NOTE — ED Provider Notes (Signed)
[facility] Emergency Department Provider Note   ____________________________________________   Event Date/Time   First MD Initiated Contact with Patient 11/06/20 [phone]     (approximate)  I have reviewed the triage vital signs and the nursing notes.   HISTORY  Chief Complaint Psychiatric Evaluation    HPI Ernest Cook is a 30 y.o. male with a past medical history of schizoaffective disorder who presents under IVC by the sheriff's office for erratic behaviors, refusing to take medications at home, pressured speech, and generally being uncooperative.  Patient refusing to participate in history or review of systems at this time.         Past Medical History:  Diagnosis Date  . Schizo affective schizophrenia [facility])     Patient Active Problem List   Diagnosis Date Noted  . Closed displaced comminuted fracture of shaft of right humerus 06/26/2020  . Broken arm, right, closed, initial encounter 06/25/2020  . Noncompliance 03/31/2016  . Cannabis use disorder, moderate, dependence (HCC) 03/26/2016  . Tobacco use disorder 03/26/2016  . Schizoaffective disorder, bipolar type (HCC) 03/25/2016  . Involuntary commitment 03/24/2016    Past Surgical History:  Procedure Laterality Date  . ORIF HUMERUS FRACTURE Right 06/27/2020   Procedure: OPEN REDUCTION INTERNAL FIXATION (ORIF) HUMERAL SHAFT FRACTURE;  Surgeon: Roby Lofts, MD;  Location: [facility] OR;  Service: Orthopedics;  Laterality: Right;  . SKIN GRAFT Left unknown   Pt reports left foot and leg skin graft    Prior to Admission medications   Medication Sig Start Date End Date Taking? Authorizing Provider  aspirin EC 81 MG EC tablet Take 1 tablet (81 mg total) by mouth daily. Swallow whole. 08/02/20   Clapacs, Jackquline Denmark, MD  benztropine (COGENTIN) 1 MG tablet Take 1 tablet (1 mg total) by mouth at bedtime. 08/01/20   Clapacs, Jackquline Denmark, MD  divalproex (DEPAKOTE) 500 MG DR tablet Take 1 tablet (500 mg total)  by mouth every 12 (twelve) hours. 10/26/20   Clapacs, Jackquline Denmark, MD  haloperidol (HALDOL) 10 MG tablet Take 10 mg by mouth at bedtime. 08/01/20   [provider]  haloperidol decanoate (HALDOL DECANOATE) 100 MG/ML injection Inject 1 mL (100 mg total) into the muscle every 28 (twenty-eight) days. 11/15/20   Clapacs, Jackquline Denmark, MD  lithium carbonate (ESKALITH) 450 MG CR tablet Take 1 tablet (450 mg total) by mouth every 12 (twelve) hours. 10/26/20   Clapacs, Jackquline Denmark, MD  methocarbamol (ROBAXIN) 500 MG tablet Take 1 tablet (500 mg total) by mouth every 6 (six) hours as needed for muscle spasms. 08/01/20   Clapacs, Jackquline Denmark, MD  OLANZapine zydis (ZYPREXA) 20 MG disintegrating tablet Take 1 tablet (20 mg total) by mouth at bedtime. 10/26/20   Clapacs, Jackquline Denmark, MD  risperiDONE (RISPERDAL M-TABS) 4 MG disintegrating tablet Take 1 tablet (4 mg total) by mouth at bedtime. 10/26/20   Clapacs, Jackquline Denmark, MD  risperiDONE microspheres (RISPERDAL CONSTA) 50 MG injection Inject 2 mLs (50 mg total) into the muscle every 14 (fourteen) days. 11/05/20   Clapacs, Jackquline Denmark, MD    Allergies Penicillins  Family History  Problem Relation Age of Onset  . Diabetes Mother   . Hypertension Mother     Social History Social History   Tobacco Use  . Smoking status: Current Every Day Smoker    Packs/day: 0.25    Types: Cigarettes  . Smokeless tobacco: Never Used  Substance Use Topics  . Alcohol use: No  . Drug  use: Yes    Types: Marijuana    Comment: "every now and again"    Review of Systems Unable to assess ____________________________________________   PHYSICAL EXAM:  VITAL SIGNS: ED Triage Vitals  Enc Vitals Group     BP 11/06/20 1034 117/80     Pulse Rate 11/06/20 1034 81     Resp 11/06/20 1034 17     Temp 11/06/20 1034 98.1 F (36.7 C)     Temp Source 11/06/20 1034 Oral     SpO2 11/06/20 1034 100 %     Weight 11/06/20 1040 198 lb (89.8 kg)     Height 11/06/20 1040 5\' 10"  (1.778 m)     Head  Circumference --      Peak Flow --      Pain Score 11/06/20 1040 0     Pain Loc --      Pain Edu? --      Excl. in GC? --    Constitutional: Alert. Well appearing African-American male in no acute distress. Eyes: Conjunctivae are injected. PERRL. Head: Atraumatic. Nose: No congestion/rhinnorhea. Mouth/Throat: Mucous membranes are moist. Neck: No stridor Cardiovascular: Grossly normal heart sounds.  Good peripheral circulation. Respiratory: Normal respiratory effort.  No retractions. Gastrointestinal: Soft and nontender. No distention. Musculoskeletal: No obvious deformities Neurologic:  Normal speech and language. No gross focal neurologic deficits are appreciated. Skin:  Skin is warm and dry. No rash noted. Psychiatric: Mood is agitated. Speech and behavior are disorganized.  ____________________________________________   LABS (all labs ordered are listed, but only abnormal results are displayed)  Labs Reviewed  RESP PANEL BY RT-PCR (FLU A&B, COVID) ARPGX2  VALPROIC ACID LEVEL  URINE DRUG SCREEN, QUALITATIVE ([facility] ONLY)   PROCEDURES  Procedure(s) performed (including Critical Care):  Procedures   ____________________________________________   INITIAL IMPRESSION / ASSESSMENT AND PLAN / ED COURSE  As part of my medical decision making, I reviewed the following data within the electronic MEDICAL RECORD NUMBER Nursing notes reviewed and incorporated, Labs reviewed, Old chart reviewed, and Notes from prior ED visits reviewed and incorporated        Patient presents under IVC for hallucinations/delusions. Thoughts are disorganized. No history of prior suicide attempt, and no SI or HI at this time. Clinically w/ no overt toxidrome, low suspicion for ingestion given hx and exam Thoughts unlikely 2/2 anemia, hypothyroidism, infection, or ICH. Patients decision making capacity is compromised and they are unable to perform all ADLs (additionally they are without appropriate  caretakers to assist through this deficit).  Consult: Psychiatry to evaluate patient for grave disability Disposition: Pending psychiatric evaluation  Care of this patient will be signed out the oncoming physician.  All pertinent patient formation is conveyed and all questions answered.  All further care and disposition decisions will be made by the oncoming physician.      ____________________________________________   FINAL CLINICAL IMPRESSION(S) / ED DIAGNOSES  Final diagnoses:  Aggressive behavior  Acute psychosis [facility])     ED Discharge Orders    None       Note:  This document was prepared using Dragon voice recognition software and may include unintentional dictation errors.   [facility], MD 11/06/20 [phone]

## 2020-11-06 NOTE — ED Notes (Signed)
Unable to assess currently due to patient sleeping.  Will monitor.

## 2020-11-06 NOTE — ED Notes (Signed)
Meal tray placed in room 

## 2020-11-06 NOTE — BH Assessment (Signed)
Referral information for Psychiatric Hospitalization faxed to:  . [facility] ([phone])  [facility] ([phone]-or- [phone]),   [facility] ([phone]),   [facility] ([phone] -or- [phone]),   [facility] ([phone] or [phone])  [facility] ([phone] or [phone])  . [name] ([phone]---[phone]---[phone]),  

## 2020-11-06 NOTE — ED Notes (Signed)
Items:  Black Jacket Blue Jeans Gray/Orange long sleeve t-shirt Blue boxers Black/red/blue [employer]

## 2020-11-06 NOTE — ED Notes (Signed)
Pt awakened briefly to administer injection and immediately went back to sleep.

## 2020-11-06 NOTE — ED Notes (Signed)
IVC/ received a call Ernest Cook patient received a bed placement tomorrow @ [facility]
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
## 2020-11-06 NOTE — ED Triage Notes (Signed)
Pt brought in by sheriffs office under IVC. Per IVC papers pt brought in from store for erratic behaviors. Pt refusing to take medications at home. Pt with pressured speech and not making sense upon arrival. Pt uncooperative with triage process. Pt requiring medication upon arrival to room 22 in the quad. Pt refusing to answer any triage questions appropriately.
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
## 2020-11-07 NOTE — ED Notes (Signed)
Refusing labs 

## 2020-11-07 NOTE — ED Notes (Signed)
[place]  [place]  SHERIFF  DEPT  CALLED PT  GOING  TO  [facility]

## 2020-11-07 NOTE — ED Notes (Signed)
Transport here for patient. requested patient to change into paper scrubs for transport. Pt refusing at this time.

## 2020-11-07 NOTE — ED Notes (Signed)
Refused to change into paper scrubs. Pt left in burgundy scrubs. Discharged with transport [employer] officer to [facility]. 1 labeled bag given to transport.
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
## 2020-11-22 ENCOUNTER — Other Ambulatory Visit: Payer: Self-pay | Admitting: Psychiatry

## 2020-12-02 ENCOUNTER — Other Ambulatory Visit: Payer: Self-pay

## 2020-12-02 ENCOUNTER — Emergency Department
Admission: EM | Admit: 2020-12-02 | Discharge: 2020-12-12 | Disposition: A | Discharge status: Home / Self Care | Payer: No Typology Code available for payment source | Attending: Emergency Medicine | Admitting: Emergency Medicine

## 2020-12-02 ENCOUNTER — Encounter: Payer: Self-pay | Admitting: Emergency Medicine

## 2020-12-02 DIAGNOSIS — Z20822 Contact with and (suspected) exposure to covid-19: Secondary | ICD-10-CM | POA: Insufficient documentation

## 2020-12-02 DIAGNOSIS — F250 Schizoaffective disorder, bipolar type: Secondary | ICD-10-CM | POA: Diagnosis not present

## 2020-12-02 DIAGNOSIS — F29 Unspecified psychosis not due to a substance or known physiological condition: Secondary | ICD-10-CM | POA: Insufficient documentation

## 2020-12-02 DIAGNOSIS — F17210 Nicotine dependence, cigarettes, uncomplicated: Secondary | ICD-10-CM | POA: Diagnosis not present

## 2020-12-02 DIAGNOSIS — F259 Schizoaffective disorder, unspecified: Secondary | ICD-10-CM | POA: Diagnosis not present

## 2020-12-02 DIAGNOSIS — Z046 Encounter for general psychiatric examination, requested by authority: Secondary | ICD-10-CM | POA: Diagnosis present

## 2020-12-02 DIAGNOSIS — Z7982 Long term (current) use of aspirin: Secondary | ICD-10-CM | POA: Diagnosis not present

## 2020-12-02 DIAGNOSIS — F23 Brief psychotic disorder: Secondary | ICD-10-CM

## 2020-12-02 LAB — URINE DRUG SCREEN, QUALITATIVE (ARMC ONLY)
Amphetamines, Ur Screen: NOT DETECTED
Barbiturates, Ur Screen: NOT DETECTED
Benzodiazepine, Ur Scrn: NOT DETECTED
Cannabinoid 50 Ng, Ur ~~LOC~~: NOT DETECTED
Cocaine Metabolite,Ur ~~LOC~~: NOT DETECTED
MDMA (Ecstasy)Ur Screen: NOT DETECTED
Methadone Scn, Ur: NOT DETECTED
Opiate, Ur Screen: NOT DETECTED
Phencyclidine (PCP) Ur S: NOT DETECTED
Tricyclic, Ur Screen: NOT DETECTED

## 2020-12-02 LAB — RESP PANEL BY RT-PCR (FLU A&B, COVID) ARPGX2
Influenza A by PCR: NEGATIVE
Influenza B by PCR: NEGATIVE
SARS Coronavirus 2 by RT PCR: NEGATIVE

## 2020-12-02 MED ORDER — HALOPERIDOL LACTATE 5 MG/ML IJ SOLN
5.0000 mg | Freq: Four times a day (QID) | INTRAMUSCULAR | Status: DC | PRN
  Administered 2020-12-02 – 2020-12-07 (×3): 5 mg via INTRAMUSCULAR
  Filled 2020-12-02 (×3): qty 1

## 2020-12-02 MED ORDER — DIPHENHYDRAMINE HCL 50 MG/ML IJ SOLN
50.0000 mg | Freq: Four times a day (QID) | INTRAMUSCULAR | Status: DC | PRN
  Administered 2020-12-02 – 2020-12-07 (×3): 50 mg via INTRAMUSCULAR
  Filled 2020-12-02 (×3): qty 1

## 2020-12-02 MED ORDER — LORAZEPAM 2 MG/ML IJ SOLN
2.0000 mg | INTRAMUSCULAR | Status: DC | PRN
  Administered 2020-12-02 – 2020-12-07 (×3): 2 mg via INTRAMUSCULAR
  Filled 2020-12-02 (×3): qty 1

## 2020-12-02 NOTE — ED Triage Notes (Signed)
Pt in custody of [employer]. Pt is under IVC. Per IVC paperwork, pt was changed with trespassing at a business, pt was trying to steal items from the store. Pt has a hx of schizophrenia and pt is speaking to self in non comprehensible speech. Unable to answer my questions at this time. Pt is calm and cooperative at this time.

## 2020-12-02 NOTE — ED Notes (Addendum)
Meds admin'd att, pt willingly allowed meds to be given

## 2020-12-02 NOTE — ED Notes (Addendum)
Pt unable to follow direction, redirecting pt to stay in room and stop swearing in hallway

## 2020-12-02 NOTE — ED Notes (Signed)
Pt unable to follow direction, redirecting pt to stay in room

## 2020-12-02 NOTE — ED Notes (Signed)
TTS with pt att 

## 2020-12-02 NOTE — ED Notes (Signed)
Pt unable to follow direction, redirecting pt to stay in room and stop swearing in hallway  Unable to effectively manage pt with 20 being unstable

## 2020-12-02 NOTE — ED Notes (Signed)
Per Dr Vicente Males, no blood work at this time

## 2020-12-02 NOTE — ED Notes (Signed)
Pt given meal tray.

## 2020-12-02 NOTE — ED Notes (Signed)
Pt belongings (1/1) include:  1 black shirt  1 pink shirt  1 pair of tan pants  1 pair of black shoes  1 blue underwear  1 navy blue underwear

## 2020-12-02 NOTE — ED Notes (Signed)
Pt unable to follow direction, continous redirection to stay in room and stop swearing in hallway ineffective

## 2020-12-03 DIAGNOSIS — F250 Schizoaffective disorder, bipolar type: Secondary | ICD-10-CM | POA: Diagnosis not present

## 2020-12-03 LAB — CBC WITH DIFFERENTIAL/PLATELET
Abs Immature Granulocytes: 0.05 10*3/uL (ref 0.00–0.07)
Basophils Absolute: 0 10*3/uL (ref 0.0–0.1)
Basophils Relative: 0 %
Eosinophils Absolute: 0.2 10*3/uL (ref 0.0–0.5)
Eosinophils Relative: 1 %
HCT: 39.6 % (ref 39.0–52.0)
Hemoglobin: 12.6 g/dL — ABNORMAL LOW (ref 13.0–17.0)
Immature Granulocytes: 0 %
Lymphocytes Relative: 22 %
Lymphs Abs: 2.8 10*3/uL (ref 0.7–4.0)
MCH: 26.7 pg (ref 26.0–34.0)
MCHC: 31.8 g/dL (ref 30.0–36.0)
MCV: 83.9 fL (ref 80.0–100.0)
Monocytes Absolute: 1.2 10*3/uL — ABNORMAL HIGH (ref 0.1–1.0)
Monocytes Relative: 10 %
Neutro Abs: 8.4 10*3/uL — ABNORMAL HIGH (ref 1.7–7.7)
Neutrophils Relative %: 67 %
Platelets: 284 10*3/uL (ref 150–400)
RBC: 4.72 MIL/uL (ref 4.22–5.81)
RDW: 16.1 % — ABNORMAL HIGH (ref 11.5–15.5)
WBC: 12.7 10*3/uL — ABNORMAL HIGH (ref 4.0–10.5)
nRBC: 0 % (ref 0.0–0.2)

## 2020-12-03 LAB — COMPREHENSIVE METABOLIC PANEL
ALT: 16 U/L (ref 0–44)
AST: 31 U/L (ref 15–41)
Albumin: 3.8 g/dL (ref 3.5–5.0)
Alkaline Phosphatase: 53 U/L (ref 38–126)
Anion gap: 7 (ref 5–15)
BUN: 12 mg/dL (ref 6–20)
CO2: 25 mmol/L (ref 22–32)
Calcium: 8.9 mg/dL (ref 8.9–10.3)
Chloride: 107 mmol/L (ref 98–111)
Creatinine, Ser: 0.93 mg/dL (ref 0.61–1.24)
GFR, Estimated: >60 mL/min (ref >60)
Glucose, Bld: 118 mg/dL — ABNORMAL HIGH (ref 70–99)
Potassium: 4.2 mmol/L (ref 3.5–5.1)
Sodium: 139 mmol/L (ref 135–145)
Total Bilirubin: 0.6 mg/dL (ref 0.3–1.2)
Total Protein: 6.4 g/dL — ABNORMAL LOW (ref 6.5–8.1)

## 2020-12-03 LAB — ACETAMINOPHEN LEVEL: Acetaminophen (Tylenol), Serum: <10 ug/mL — ABNORMAL LOW (ref 10–30)

## 2020-12-03 LAB — SALICYLATE LEVEL: Salicylate Lvl: <7 mg/dL — ABNORMAL LOW (ref 7.0–30.0)

## 2020-12-03 LAB — ETHANOL: Alcohol, Ethyl (B): <10 mg/dL (ref <10)

## 2020-12-03 MED ORDER — OLANZAPINE 10 MG PO TBDP
20.0000 mg | ORAL_TABLET | Freq: Every day | ORAL | Status: DC
  Administered 2020-12-03 – 2020-12-11 (×8): 20 mg via ORAL
  Filled 2020-12-03 (×5): qty 2
  Filled 2020-12-03: qty 4
  Filled 2020-12-03: qty 2
  Filled 2020-12-03 (×2): qty 4
  Filled 2020-12-03: qty 2

## 2020-12-03 MED ORDER — HALOPERIDOL 5 MG PO TABS
10.0000 mg | ORAL_TABLET | Freq: Every day | ORAL | Status: DC
  Administered 2020-12-03 – 2020-12-11 (×8): 10 mg via ORAL
  Filled 2020-12-03 (×8): qty 2

## 2020-12-03 MED ORDER — DIVALPROEX SODIUM 500 MG PO DR TAB
500.0000 mg | DELAYED_RELEASE_TABLET | Freq: Two times a day (BID) | ORAL | Status: DC
  Administered 2020-12-03 – 2020-12-06 (×6): 500 mg via ORAL
  Filled 2020-12-03 (×6): qty 1

## 2020-12-03 MED ORDER — ASPIRIN EC 81 MG PO TBEC
81.0000 mg | DELAYED_RELEASE_TABLET | Freq: Every day | ORAL | Status: DC
  Administered 2020-12-04 – 2020-12-12 (×9): 81 mg via ORAL
  Filled 2020-12-03 (×9): qty 1

## 2020-12-03 MED ORDER — LITHIUM CARBONATE ER 450 MG PO TBCR
450.0000 mg | EXTENDED_RELEASE_TABLET | Freq: Two times a day (BID) | ORAL | Status: DC
  Administered 2020-12-03 – 2020-12-06 (×6): 450 mg via ORAL
  Filled 2020-12-03 (×6): qty 1
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
## 2020-12-03 NOTE — ED Notes (Signed)
Lunch tray given. No other needs found at this moment.  

## 2020-12-03 NOTE — BH Assessment (Signed)
Referral information for Psychiatric Hospitalization faxed to;   [name] [phone]),   . [facility] ([phone]--[phone])  . [name] [phone]),  . [name] [phone], [phone], [phone] or [phone]),   . [place] [phone] or [phone])  . [facility] [phone]),   . [name] [phone]- [phone]),   . [facility] [phone]).  [facility] [phone])

## 2020-12-03 NOTE — BH Assessment (Deleted)
Referral information for Psychiatric Hospitalization faxed to:   [facility] ([phone]-or- [phone]),    [facility] ([phone]),    [facility] ([phone] -or- [phone]),    [facility] [phone] -or- [phone])  [phone]   [name] ([phone]),    [place] ([phone] or [phone])   [facility] ([phone] or [phone])   [facility] ([phone] or [phone]),    [facility] ([phone]). 

## 2020-12-03 NOTE — ED Notes (Signed)
Pt has gone back to sleep. Breakfast tray at end of bed

## 2020-12-03 NOTE — ED Provider Notes (Signed)
Emergency Medicine Observation Re-evaluation Note  Ernest Cook is a 30 y.o. male, seen on rounds today.  Pt initially presented to the ED for complaints of IVC Currently, the patient is resting, voices no medical complaints.  Physical Exam  BP 125/85 (BP Location: Right Arm)   Pulse 79   Temp 98.2 F (36.8 C) (Oral)   Resp 20   Ht 5\' 11"  (1.803 m)   Wt 90.7 kg   SpO2 100%   BMI 27.89 kg/m  Physical Exam General: Resting in no acute distress Cardiac: No cyanosis Lungs: Equal rise and fall Psych: Not agitated  ED Course / MDM  EKG:   I have reviewed the labs performed to date as well as medications administered while in observation.  Recent changes in the last 24 hours include no events overnight.  Plan  Current plan is for psychiatric disposition. Patient is under full IVC at this time.   , MD 12/03/20 [phone]

## 2020-12-03 NOTE — ED Notes (Signed)
Gave food tray with juice. 

## 2020-12-03 NOTE — ED Notes (Signed)
IVC/pending psych consult 

## 2020-12-03 NOTE — ED Notes (Signed)
No VS att - pt sleeping

## 2020-12-03 NOTE — Consult Note (Signed)
[facility] Face-to-Face Psychiatry Consult   Reason for Consult: Consult for 30 year old man with a history of chronic psychotic disorder brought into the hospital under IVC Referring Physician: Katrinka Blazing Patient Identification: Ernest Cook MRN:  [id number] Principal Diagnosis: Schizoaffective disorder, bipolar type (HCC) Diagnosis:  Principal Problem:   Schizoaffective disorder, bipolar type (HCC)   Total Time spent with patient: 1 hour  Subjective:   Ernest Cook is a 30 y.o. male patient admitted with "I did not do nothing".  HPI: Patient seen chart reviewed.  Patient brought in with commitment papers reporting that he had been trespassing at a store trying to steal things agitated bizarre behavior.  When he first came to the emergency room he was disorganized.  Slept for a long period of time later on still disorganized with completely irrational nonsensical speech.  This evening he is calm down a little bit.  Spoke a little bit with me.  Told me he did not do anything that he is accused of.  Claims he still taking his medicine every day.  Denies having any current hallucinations denies suicidal or homicidal ideation.  Denies any recent use of drugs or alcohol  Past Psychiatric History: Patient has a past history past history of schizoaffective disorder with multiple hospitalizations.  History of aggression when psychotic.  Has been on multiple medications even at high doses and seems to keep decompensating outside the hospital  Risk to Self:   Risk to Others:   Prior Inpatient Therapy:   Prior Outpatient Therapy:    Past Medical History:  Past Medical History:  Diagnosis Date  . Schizo affective schizophrenia [facility])     Past Surgical History:  Procedure Laterality Date  . ORIF HUMERUS FRACTURE Right 06/27/2020   Procedure: OPEN REDUCTION INTERNAL FIXATION (ORIF) HUMERAL SHAFT FRACTURE;  Surgeon: Roby Lofts, MD;  Location: [facility] OR;  Service: Orthopedics;  Laterality: Right;  . SKIN  GRAFT Left unknown   Pt reports left foot and leg skin graft   Family History:  Family History  Problem Relation Age of Onset  . Diabetes Mother   . Hypertension Mother    Family Psychiatric  History: None reported Social History:  Social History   Substance and Sexual Activity  Alcohol Use No     Social History   Substance and Sexual Activity  Drug Use Yes  . Types: Marijuana   Comment: "every now and again"    Social History   Socioeconomic History  . Marital status: Single    Spouse name: Not on file  . Number of children: Not on file  . Years of education: Not on file  . Highest education level: Not on file  Occupational History  . Not on file  Tobacco Use  . Smoking status: Current Every Day Smoker    Packs/day: 0.25    Types: Cigarettes  . Smokeless tobacco: Never Used  Substance and Sexual Activity  . Alcohol use: No  . Drug use: Yes    Types: Marijuana    Comment: "every now and again"  . Sexual activity: Yes    Birth control/protection: Condom    Comment: Pt reports he is attracted to men  Other Topics Concern  . Not on file  Social History Narrative  . Not on file   Social Determinants of Health   Financial Resource Strain: Not on file  Food Insecurity: Not on file  Transportation Needs: Not on file  Physical Activity: Not on file  Stress: Not on  file  Social Connections: Not on file   Additional Social History:    Allergies:   Allergies  Allergen Reactions  . Penicillins Other (See Comments)    Seizure when he was 19    Labs:  Results for orders placed or performed during the hospital encounter of 12/02/20 (from the past 48 hour(s))  Resp Panel by RT-PCR (Flu A&B, Covid) Nasopharyngeal Swab     Status: None   Collection Time: 12/02/20  7:59 PM   Specimen: Nasopharyngeal Swab; Nasopharyngeal(NP) swabs in vial transport medium  Result Value Ref Range   SARS Coronavirus 2 by RT PCR NEGATIVE NEGATIVE    Comment: (NOTE) SARS-CoV-2  target nucleic acids are NOT DETECTED.  The SARS-CoV-2 RNA is generally detectable in upper respiratory specimens during the acute phase of infection. The lowest concentration of SARS-CoV-2 viral copies this assay can detect is 138 copies/mL. A negative result does not preclude SARS-Cov-2 infection and should not be used as the sole basis for treatment or other patient management decisions. A negative result may occur with  improper specimen collection/handling, submission of specimen other than nasopharyngeal swab, presence of viral mutation(s) within the areas targeted by this assay, and inadequate number of viral copies(<138 copies/mL). A negative result must be combined with clinical observations, patient history, and epidemiological information. The expected result is Negative.  Fact Sheet for Patients:  BloggerCourse.com  Fact Sheet for Healthcare Providers:  SeriousBroker.it  This test is no t yet approved or cleared by the [place] FDA and  has been authorized for detection and/or diagnosis of SARS-CoV-2 by FDA under an Emergency Use Authorization (EUA). This EUA will remain  in effect (meaning this test can be used) for the duration of the COVID-19 declaration under Section 564(b)(1) of the Act, 21 U.S.C.section 360bbb-3(b)(1), unless the authorization is terminated  or revoked sooner.       Influenza A by PCR NEGATIVE NEGATIVE   Influenza B by PCR NEGATIVE NEGATIVE    Comment: (NOTE) The Xpert Xpress SARS-CoV-2/FLU/RSV plus assay is intended as an aid in the diagnosis of influenza from Nasopharyngeal swab specimens and should not be used as a sole basis for treatment. Nasal washings and aspirates are unacceptable for Xpert Xpress SARS-CoV-2/FLU/RSV testing.  Fact Sheet for Patients: BloggerCourse.com  Fact Sheet for Healthcare Providers: SeriousBroker.it  This  test is not yet approved or cleared by the [place] FDA and has been authorized for detection and/or diagnosis of SARS-CoV-2 by FDA under an Emergency Use Authorization (EUA). This EUA will remain in effect (meaning this test can be used) for the duration of the COVID-19 declaration under Section 564(b)(1) of the Act, 21 U.S.C. section 360bbb-3(b)(1), unless the authorization is terminated or revoked.  Performed at [facility], [address] [place], [place] [address]   Urine Drug Screen, Qualitative [facility] only)     Status: None   Collection Time: 12/02/20 11:18 PM  Result Value Ref Range   Tricyclic, Ur Screen NONE DETECTED NONE DETECTED   Amphetamines, Ur Screen NONE DETECTED NONE DETECTED   MDMA (Ecstasy)Ur Screen NONE DETECTED NONE DETECTED   Cocaine Metabolite,Ur [place] NONE DETECTED NONE DETECTED   Opiate, Ur Screen NONE DETECTED NONE DETECTED   Phencyclidine (PCP) Ur S NONE DETECTED NONE DETECTED   Cannabinoid 50 Ng, Ur [place] NONE DETECTED NONE DETECTED   Barbiturates, Ur Screen NONE DETECTED NONE DETECTED   Benzodiazepine, Ur Scrn NONE DETECTED NONE DETECTED   Methadone Scn, Ur NONE DETECTED NONE DETECTED  Comment: (NOTE) Tricyclics + metabolites, urine    Cutoff 1000 ng/mL Amphetamines + metabolites, urine  Cutoff 1000 ng/mL MDMA (Ecstasy), urine              Cutoff 500 ng/mL Cocaine Metabolite, urine          Cutoff 300 ng/mL Opiate + metabolites, urine        Cutoff 300 ng/mL Phencyclidine (PCP), urine         Cutoff 25 ng/mL Cannabinoid, urine                 Cutoff 50 ng/mL Barbiturates + metabolites, urine  Cutoff 200 ng/mL Benzodiazepine, urine              Cutoff 200 ng/mL Methadone, urine                   Cutoff 300 ng/mL  The urine drug screen provides only a preliminary, unconfirmed analytical test result and should not be used for non-medical purposes. Clinical consideration and professional judgment should be applied to any positive drug  screen result due to possible interfering substances. A more specific alternate chemical method must be used in order to obtain a confirmed analytical result. Gas chromatography / mass spectrometry (GC/MS) is the preferred confirm atory method. Performed at [facility], [address] [place], [place] [address]   Comprehensive metabolic panel     Status: Abnormal   Collection Time: 12/03/20  3:02 PM  Result Value Ref Range   Sodium 139 135 - 145 mmol/L   Potassium 4.2 3.5 - 5.1 mmol/L   Chloride 107 98 - 111 mmol/L   CO2 25 22 - 32 mmol/L   Glucose, Bld 118 (H) 70 - 99 mg/dL    Comment: Glucose reference range applies only to samples taken after fasting for at least 8 hours.   BUN 12 6 - 20 mg/dL   Creatinine, Ser 6.04 0.61 - 1.24 mg/dL   Calcium 8.9 8.9 - 54.0 mg/dL   Total Protein 6.4 (L) 6.5 - 8.1 g/dL   Albumin 3.8 3.5 - 5.0 g/dL   AST 31 15 - 41 U/L   ALT 16 0 - 44 U/L   Alkaline Phosphatase 53 38 - 126 U/L   Total Bilirubin 0.6 0.3 - 1.2 mg/dL   GFR, Estimated >98 >11 mL/min    Comment: (NOTE) Calculated using the CKD-EPI Creatinine Equation (2021)    Anion gap 7 5 - 15    Comment: Performed at [facility], [address] [place], [place] [address]  Ethanol     Status: None   Collection Time: 12/03/20  3:02 PM  Result Value Ref Range   Alcohol, Ethyl (B) <10 <10 mg/dL    Comment: (NOTE) Lowest detectable limit for serum alcohol is 10 mg/dL.  For medical purposes only. Performed at [facility], [address] [place], [place] [address]   CBC with Diff     Status: Abnormal   Collection Time: 12/03/20  3:02 PM  Result Value Ref Range   WBC 12.7 (H) 4.0 - 10.5 K/uL   RBC 4.72 4.22 - 5.81 MIL/uL   Hemoglobin 12.6 (L) 13.0 - 17.0 g/dL   HCT 13.0 86.5 - 78.4 %   MCV 83.9 80.0 - 100.0 fL   MCH 26.7 26.0 - 34.0 pg   MCHC 31.8 30.0 - 36.0 g/dL   RDW 69.6 (H) 29.5 - 28.4 %   Platelets 284 150 - 400 K/uL  nRBC 0.0 0.0 - 0.2 %    Neutrophils Relative % 67 %   Neutro Abs 8.4 (H) 1.7 - 7.7 K/uL   Lymphocytes Relative 22 %   Lymphs Abs 2.8 0.7 - 4.0 K/uL   Monocytes Relative 10 %   Monocytes Absolute 1.2 (H) 0.1 - 1.0 K/uL   Eosinophils Relative 1 %   Eosinophils Absolute 0.2 0.0 - 0.5 K/uL   Basophils Relative 0 %   Basophils Absolute 0.0 0.0 - 0.1 K/uL   Immature Granulocytes 0 %   Abs Immature Granulocytes 0.05 0.00 - 0.07 K/uL    Comment: Performed at [facility], [address] [place], [place] [address]  Acetaminophen level     Status: Abnormal   Collection Time: 12/03/20  3:02 PM  Result Value Ref Range   Acetaminophen (Tylenol), Serum <10 (L) 10 - 30 ug/mL    Comment: (NOTE) Therapeutic concentrations vary significantly. A range of 10-30 ug/mL  may be an effective concentration for many patients. However, some  are best treated at concentrations outside of this range. Acetaminophen concentrations >150 ug/mL at 4 hours after ingestion  and >50 ug/mL at 12 hours after ingestion are often associated with  toxic reactions.  Performed at [facility], [address] [place], [place] [address]   Salicylate level     Status: Abnormal   Collection Time: 12/03/20  3:02 PM  Result Value Ref Range   Salicylate Lvl <7.0 (L) 7.0 - 30.0 mg/dL    Comment: Performed at [facility], [address], [place], [place] [address]    Current Facility-Administered Medications  Medication Dose Route Frequency Provider Last Rate Last Admin  . aspirin EC tablet 81 mg  81 mg Oral Daily Rolinda Impson T, MD      . diphenhydrAMINE (BENADRYL) injection 50 mg  50 mg Intramuscular Q6H PRN Merwyn Katos, MD   50 mg at 12/02/20 2307  . divalproex (DEPAKOTE) DR tablet 500 mg  500 mg Oral Q12H Peder Allums T, MD      . haloperidol (HALDOL) tablet 10 mg  10 mg Oral QHS Toneshia Coello T, MD      . haloperidol lactate (HALDOL) injection 5 mg  5 mg Intramuscular Q6H PRN Merwyn Katos, MD   5 mg at  12/02/20 2307  . lithium carbonate (ESKALITH) CR tablet 450 mg  450 mg Oral Q12H Telvin Reinders T, MD      . LORazepam (ATIVAN) injection 2 mg  2 mg Intramuscular Q4H PRN Merwyn Katos, MD   2 mg at 12/02/20 2307  . OLANZapine zydis (ZYPREXA) disintegrating tablet 20 mg  20 mg Oral QHS Lanelle Lindo, Jackquline Denmark, MD       Current Outpatient Medications  Medication Sig Dispense Refill  . aspirin EC 81 MG EC tablet Take 1 tablet (81 mg total) by mouth daily. Swallow whole. 30 tablet 1  . benztropine (COGENTIN) 1 MG tablet Take 1 tablet (1 mg total) by mouth at bedtime. 30 tablet 1  . divalproex (DEPAKOTE) 500 MG DR tablet Take 1 tablet (500 mg total) by mouth every 12 (twelve) hours. 60 tablet 1  . haloperidol (HALDOL) 10 MG tablet Take 10 mg by mouth at bedtime.    . haloperidol decanoate (HALDOL DECANOATE) 100 MG/ML injection Inject 1 mL (100 mg total) into the muscle every 28 (twenty-eight) days. 1 mL 1  . lithium carbonate (ESKALITH) 450 MG CR tablet Take 1 tablet (450 mg total) by mouth  every 12 (twelve) hours. 60 tablet 1  . methocarbamol (ROBAXIN) 500 MG tablet Take 1 tablet (500 mg total) by mouth every 6 (six) hours as needed for muscle spasms. 60 tablet 1  . OLANZapine zydis (ZYPREXA) 20 MG disintegrating tablet Take 1 tablet (20 mg total) by mouth at bedtime. 30 tablet 1  . risperiDONE (RISPERDAL M-TABS) 4 MG disintegrating tablet Take 1 tablet (4 mg total) by mouth at bedtime. 30 tablet 1  . risperiDONE microspheres (RISPERDAL CONSTA) 50 MG injection Inject 2 mLs (50 mg total) into the muscle every 14 (fourteen) days. 1 each 1    Musculoskeletal: Strength & Muscle Tone: within normal limits Gait & Station: normal Patient leans: N/A            Psychiatric Specialty Exam:  Presentation  General Appearance: No data recorded Eye Contact:No data recorded Speech:No data recorded Speech Volume:No data recorded Handedness:No data recorded  Mood and Affect  Mood:No data  recorded Affect:No data recorded  Thought Process  Thought Processes:No data recorded Descriptions of Associations:No data recorded Orientation:No data recorded Thought Content:No data recorded History of Schizophrenia/Schizoaffective disorder:Yes  Duration of Psychotic Symptoms:Less than six months  Hallucinations:No data recorded Ideas of Reference:No data recorded Suicidal Thoughts:No data recorded Homicidal Thoughts:No data recorded  Sensorium  Memory:No data recorded Judgment:No data recorded Insight:No data recorded  Executive Functions  Concentration:No data recorded Attention Span:No data recorded Recall:No data recorded Fund of Knowledge:No data recorded Language:No data recorded  Psychomotor Activity  Psychomotor Activity:No data recorded  Assets  Assets:No data recorded  Sleep  Sleep:No data recorded  Physical Exam: Physical Exam Vitals and nursing note reviewed.  Constitutional:      Appearance: Normal appearance.  HENT:     Head: Normocephalic and atraumatic.     Mouth/Throat:     Pharynx: Oropharynx is clear.  Eyes:     Pupils: Pupils are equal, round, and reactive to light.  Cardiovascular:     Rate and Rhythm: Normal rate and regular rhythm.  Pulmonary:     Effort: Pulmonary effort is normal.     Breath sounds: Normal breath sounds.  Abdominal:     General: Abdomen is flat.     Palpations: Abdomen is soft.  Musculoskeletal:        General: Normal range of motion.  Skin:    General: Skin is warm and dry.  Neurological:     General: No focal deficit present.     Mental Status: He is alert. Mental status is at baseline.  Psychiatric:        Attention and Perception: He is inattentive.        Mood and Affect: Mood normal. Affect is blunt.        Speech: Speech is delayed.        Behavior: Behavior is withdrawn.        Thought Content: Thought content is paranoid. Thought content does not include homicidal or suicidal ideation.         Cognition and Memory: Cognition is impaired. Memory is impaired.    Review of Systems  Constitutional: Negative.   HENT: Negative.   Eyes: Negative.   Respiratory: Negative.   Cardiovascular: Negative.   Gastrointestinal: Negative.   Musculoskeletal: Negative.   Skin: Negative.   Neurological: Negative.   Psychiatric/Behavioral: Negative.    Blood pressure 127/87, pulse 86, temperature 97.8 F (36.6 C), temperature source Oral, resp. rate 18, height  (1.803 m), weight 90.7 kg, SpO2 98 %. Body mass  index is 27.89 kg/m.  Treatment Plan Summary: Medication management and Plan Typical pattern of patient to be very bizarre and agitated and then quickly snap back into lucid behavior.  Continue orders for current medicines including antipsychotics and mood stabilizers.  Reassess over the next day as labs are completed to see whether he needs further hospitalization or has returned to baseline.  Disposition: Recommend psychiatric Inpatient admission when medically cleared. Supportive therapy provided about ongoing stressors.  Mordecai Rasmussen, MD 12/03/2020 6:43 PM

## 2020-12-03 NOTE — ED Notes (Signed)
Pt noted to be sleeping att; pt ate 90% of pizza meal tray

## 2020-12-04 NOTE — ED Notes (Signed)
Pt given phone during allotted phone hours, pt called 911. Phone privileges revoked by this RN at this time. Pt then calls this RN "you white bitch, I'm just trying to tell you how it is". This RN verbally deescalated situation along with security. Security at bedside at this time.

## 2020-12-04 NOTE — ED Notes (Signed)
PT refused vitals
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
## 2020-12-04 NOTE — BH Assessment (Signed)
Referral checks:    [name] ([phone]- [phone]), No answer   [facility] ([phone]--[phone]) No intake staff after hours   [name] ([phone]---[phone]), No answer   [name] [phone], [phone], [phone] or [phone]), No intake staff after hours   [facility] [phone] or [phone]) Per Judeth Cornfield, hospital is not currently accepting outside referrals.    [facility] [phone]), No answer   [name] [phone] -or- [phone]), Per Verdie Mosher [phone]). No answer/ability to leave voicemail.    [facility] [phone])

## 2020-12-04 NOTE — ED Provider Notes (Signed)
Emergency Medicine Observation Re-evaluation Note  Ernest Cook is a 30 y.o. male, seen on rounds today.  Pt initially presented to the ED for complaints of IVC Currently, the patient is resting with no complaint.  Physical Exam  BP 116/64 (BP Location: Right Arm)   Pulse 71   Temp 98.7 F (37.1 C) (Oral)   Resp 15   Ht 5\' 11"  (1.803 m)   Wt 90.7 kg   SpO2 100%   BMI 27.89 kg/m  Physical Exam General: No apparent distress HEENT: moist mucous membranes CV: RRR Pulm: Normal WOB GI: soft and non tender MSK: no edema or cyanosis Neuro: face symmetric, moving all extremities    ED Course / MDM  EKG:   I have reviewed the labs performed to date as well as medications administered while in observation.  No changes overnight or new labs this morning..  Plan  Current plan is for placement.    , Don Perking, MD 12/04/20 [phone]

## 2020-12-04 NOTE — ED Notes (Signed)
Patient is resting comfortably in his bed in no acute distress. No needs voiced or noted. Will continue to monitor for the remainder of the shift.

## 2020-12-05 DIAGNOSIS — F250 Schizoaffective disorder, bipolar type: Secondary | ICD-10-CM | POA: Diagnosis not present

## 2020-12-05 NOTE — ED Notes (Signed)
Graham cracker and peanut butter given to patient at this time.

## 2020-12-05 NOTE — ED Notes (Signed)
Hourly rounding performed, patient currently asleep in room. Patient has no complaints at this time. Q15 minute rounds and monitoring via Rover and Officer to continue. 

## 2020-12-05 NOTE — ED Notes (Signed)
IVC, pending placement 

## 2020-12-05 NOTE — ED Provider Notes (Signed)
Emergency Medicine Observation Re-evaluation Note  Ernest Cook is a 30 y.o. male, seen on rounds today.  Pt initially presented to the ED for complaints of IVC Currently, the patient is resting, voices no medical complaints.  Physical Exam  BP 137/83 (BP Location: Left Arm)   Pulse 68   Temp 98 F (36.7 C) (Oral)   Resp 18   Ht 5\' 11"  (1.803 m)   Wt 90.7 kg   SpO2 100%   BMI 27.89 kg/m  Physical Exam General: Resting in no acute distress Cardiac: No cyanosis Lungs: Equal rise and fall Psych: Not agitated  ED Course / MDM  EKG:   I have reviewed the labs performed to date as well as medications administered while in observation.  Recent changes in the last 24 hours include no events overnight.  Plan  Current plan is for psychiatric disposition. Patient is under full IVC at this time.   , MD 12/05/20 [phone]

## 2020-12-05 NOTE — ED Notes (Signed)
Hourly rounding performed, patient currently awake in room. Patient has no complaints at this time, can be heard speaking to self. Q15 minute rounds and monitoring via Psychologist, counselling to continue.
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
## 2020-12-05 NOTE — ED Notes (Signed)
Report received from [place], English as a second language teacher. Patient alert and oriented, warm and dry, and in no acute distress. Patient denies SI, HI, AVH and pain. Patient made aware of Q15 minute rounds and Psychologist, counselling presence for their safety. Patient instructed to come to this nurse with needs or concerns. Pt refuses to have vitals obtained at this time.

## 2020-12-05 NOTE — ED Notes (Signed)
Hourly rounding performed, patient currently awake in room. Patient has no complaints at this time. Q15 minute rounds and monitoring via Psychologist, counselling to continue. continues to refuse vitals and is uninterested with talking to staff

## 2020-12-05 NOTE — ED Notes (Signed)
Pt is irritated at this time and uninterested in talking to nurse, laying in bed at this time.

## 2020-12-06 DIAGNOSIS — F250 Schizoaffective disorder, bipolar type: Secondary | ICD-10-CM | POA: Diagnosis not present

## 2020-12-06 LAB — LITHIUM LEVEL: Lithium Lvl: 0.5 mmol/L — ABNORMAL LOW (ref 0.60–1.20)

## 2020-12-06 LAB — VALPROIC ACID LEVEL: Valproic Acid Lvl: 71 ug/mL (ref 50.0–100.0)

## 2020-12-06 MED ORDER — LITHIUM CARBONATE ER 300 MG PO TBCR
600.0000 mg | EXTENDED_RELEASE_TABLET | Freq: Two times a day (BID) | ORAL | Status: DC
  Administered 2020-12-07 – 2020-12-12 (×11): 600 mg via ORAL
  Filled 2020-12-06 (×11): qty 2

## 2020-12-06 MED ORDER — RISPERIDONE MICROSPHERES ER 50 MG IM SRER
50.0000 mg | INTRAMUSCULAR | Status: DC
  Administered 2020-12-06: 50 mg via INTRAMUSCULAR
  Filled 2020-12-06: qty 2

## 2020-12-06 MED ORDER — DIVALPROEX SODIUM 500 MG PO DR TAB
500.0000 mg | DELAYED_RELEASE_TABLET | Freq: Three times a day (TID) | ORAL | Status: DC
  Administered 2020-12-07 – 2020-12-12 (×16): 500 mg via ORAL
  Filled 2020-12-06 (×16): qty 1

## 2020-12-06 NOTE — BH Assessment (Signed)
Referral checks:    [name] ([phone]- [phone]), No answer   [facility] ([phone]--[phone]) No intake staff after hours   [name] [phone]), No answer   [name] [phone], [phone], [phone] or [phone]), No intake staff after hours   [facility] [phone] or [phone]) Per Previous TTS staff: Per Judeth Cornfield, hospital is not currently accepting outside referrals.    [facility] [phone]), No answer   [name] [phone] -or- [phone]), No answer    [facility] ([phone]). No answer/ability to leave voicemail.    [facility] [phone])

## 2020-12-06 NOTE — ED Notes (Signed)
New linens, room cleaned.

## 2020-12-06 NOTE — ED Notes (Signed)
Pt uninterested in speaking to this nurse. Pt begrudgingly takes medications and quizzes nurse on each pill. Pt is irritated and defensive during interaction. Will continue to monitor.

## 2020-12-06 NOTE — ED Notes (Signed)
Due to pt behaviors, unable to collect pt vital signs.

## 2020-12-06 NOTE — ED Notes (Addendum)
Pt allowed to use phone- pt got upset while on phone and pt began yelling, hit the door- phone was taken from pt and pt began pacing- pt started hitting wall, window, and door to unit- pt asking for "Deanne Southern"

## 2020-12-06 NOTE — ED Notes (Signed)
Pt given IM medications without force

## 2020-12-06 NOTE — ED Notes (Signed)
Hourly rounding performed, patient currently asleep in room. Patient has no complaints at this time. Q15 minute rounds and monitoring via Rover and Officer to continue. 
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
## 2020-12-06 NOTE — ED Notes (Signed)
Hourly rounding performed, patient currently asleep in room. Patient has no complaints at this time. Q15 minute rounds and monitoring via Security Cameras to continue. 

## 2020-12-06 NOTE — ED Notes (Signed)
IVC/Pending Placement 

## 2020-12-06 NOTE — ED Notes (Signed)
Pt wakes at this time when nurse rounds, pt speech is jumbled together and hard to understand. Pt mutters statements  And is angry with staff but it is unclear what he is saying and why he is. Pt does not leave bed or open eyes when talking with this nurse. The two things pt states that are understandable is that someone has a gun and is shooting people, pt makes hand like gun and flails hand in every direction in room, at this nurse and to own head; pt also states he wants fish stick, hush puppies, corn bread, and baked beans, this nurse informs pt that we do not have this in ED and pt starts to yell at nurse and is not able to be understood again. Pt offered beverage, blanket and snack but response is negative no this nurse interpreted this as a no. Pt then informed that it was time for night time meds and pt again does same. Pt continuously yawning through interaction and seems to fall back asleep.

## 2020-12-06 NOTE — ED Notes (Signed)
Pt given breakfast tray. Pt continues to have conversation with self.

## 2020-12-06 NOTE — ED Notes (Signed)
Report received from [place], [place] including situation, background, assessment and recommendations. Patient sleeping, respirations regular and unlabored. Q15 minute rounds and security camera observation to continue. Will assess patient and collect vitals once awake.

## 2020-12-06 NOTE — ED Notes (Signed)
Pt given lunch tray.

## 2020-12-06 NOTE — ED Notes (Signed)
Pt given breakfast.

## 2020-12-06 NOTE — ED Notes (Signed)
Moved to [facility] by EDT and security.

## 2020-12-06 NOTE — ED Notes (Signed)
Verified with ACT team [phone] that pt has not had IM injection of Risperdal in last 2 weeks.

## 2020-12-06 NOTE — ED Notes (Signed)
Pt consented to labs with no complaints, was calm and cooperative throughout interaction. Pt was also compliant with vitals. This tech was able to obtain both.

## 2020-12-06 NOTE — ED Notes (Signed)
Obtained phone from pt att.

## 2020-12-06 NOTE — ED Notes (Signed)
Pt is in room 6, to be corrected in chart now

## 2020-12-06 NOTE — ED Notes (Signed)
Hourly rounding performed, patient currently awake in room. Patient has no complaints at this time. Q15 minute rounds and monitoring via Security Cameras to continue. 

## 2020-12-06 NOTE — ED Notes (Signed)
Pt continues to bang on door and window, cursing loudly

## 2020-12-06 NOTE — Consult Note (Signed)
[facility] Face-to-Face Psychiatry Consult   Reason for Consult: Follow-up consult for this 30 year old man with schizoaffective disorder Referring Physician: Derrill Kay Patient Identification: Ernest Cook MRN:  [id number] Principal Diagnosis: Schizoaffective disorder, bipolar type (HCC) Diagnosis:  Principal Problem:   Schizoaffective disorder, bipolar type (HCC)   Total Time spent with patient: 30 minutes  Subjective:   Ernest Cook is a 30 y.o. male patient admitted with "I just need to go".  HPI: Patient seen for follow-up today.  He is more awake and alert.  Unfortunately that also means he is having more spells of agitation.  This afternoon he has been moved back to the [facility] and is starting to get agitated pounding on doors and windows.  When I spoke with him today he was still making very little sense.  He slurs his speech but more than that he is going from topic to topic with flight of ideas labile affect.  Continually asks why he is in the hospital and when I tried to explain what the commitment papers say he immediately interrupted talks over me denies all of it tells stories about other things.  He has been compliant with medicine and his lithium level and Depakote level came back today both positive but both on the low side.  Past Psychiatric History: Well-known to our service this patient has been on a long psychotic episode from probably the last year.  Frequently brought in under IVC.  He has had multiple hospitalizations and been discharged back to outpatient treatment in [facility] but seems to always once again do something that gets him committed.  Because of the current circumstances with the state hospital has been impossible to try and get him into longer term treatment.  He does have a history of aggression and a potential for violence while psychotic  Risk to Self:   Risk to Others:   Prior Inpatient Therapy:   Prior Outpatient Therapy:    Past Medical History:  Past  Medical History:  Diagnosis Date  . Schizo affective schizophrenia [facility]     Past Surgical History:  Procedure Laterality Date  . ORIF HUMERUS FRACTURE Right 06/27/2020   Procedure: OPEN REDUCTION INTERNAL FIXATION (ORIF) HUMERAL SHAFT FRACTURE;  Surgeon: Roby Lofts, MD;  Location: [facility] OR;  Service: Orthopedics;  Laterality: Right;  . SKIN GRAFT Left unknown   Pt reports left foot and leg skin graft   Family History:  Family History  Problem Relation Age of Onset  . Diabetes Mother   . Hypertension Mother    Family Psychiatric  History: See previous. Social History:  Social History   Substance and Sexual Activity  Alcohol Use No     Social History   Substance and Sexual Activity  Drug Use Yes  . Types: Marijuana   Comment: "every now and again"    Social History   Socioeconomic History  . Marital status: Single    Spouse name: Not on file  . Number of children: Not on file  . Years of education: Not on file  . Highest education level: Not on file  Occupational History  . Not on file  Tobacco Use  . Smoking status: Current Every Day Smoker    Packs/day: 0.25    Types: Cigarettes  . Smokeless tobacco: Never Used  Substance and Sexual Activity  . Alcohol use: No  . Drug use: Yes    Types: Marijuana    Comment: "every now and again"  . Sexual activity: Yes  Birth control/protection: Condom    Comment: Pt reports he is attracted to men  Other Topics Concern  . Not on file  Social History Narrative  . Not on file   Social Determinants of Health   Financial Resource Strain: Not on file  Food Insecurity: Not on file  Transportation Needs: Not on file  Physical Activity: Not on file  Stress: Not on file  Social Connections: Not on file   Additional Social History:    Allergies:   Allergies  Allergen Reactions  . Penicillins Other (See Comments)    Seizure when he was 19    Labs:  Results for orders placed or performed during the hospital  encounter of 12/02/20 (from the past 48 hour(s))  Valproic acid level     Status: None   Collection Time: 12/06/20  8:51 AM  Result Value Ref Range   Valproic Acid Lvl 71 50.0 - 100.0 ug/mL    Comment: Performed at [facility], [address] [place], [place] [address]  Lithium level     Status: Abnormal   Collection Time: 12/06/20  8:51 AM  Result Value Ref Range   Lithium Lvl 0.50 (L) 0.60 - 1.20 mmol/L    Comment: Performed at [facility], [address] [place], [place] [address]    Current Facility-Administered Medications  Medication Dose Route Frequency Provider Last Rate Last Admin  . aspirin EC tablet 81 mg  81 mg Oral Daily Jacion Dismore, Jackquline DenmarkJohn T, MD   81 mg at 12/06/20 0907  . diphenhydrAMINE (BENADRYL) injection 50 mg  50 mg Intramuscular Q6H PRN Merwyn KatosBradler, Evan K, MD   50 mg at 12/02/20 2307  . divalproex (DEPAKOTE) DR tablet 500 mg  500 mg Oral Q8H Jonda Alanis T, MD      . haloperidol (HALDOL) tablet 10 mg  10 mg Oral QHS Maico Mulvehill T, MD   10 mg at 12/05/20 2145  . haloperidol lactate (HALDOL) injection 5 mg  5 mg Intramuscular Q6H PRN Merwyn KatosBradler, Evan K, MD   5 mg at 12/02/20 2307  . lithium carbonate (LITHOBID) CR tablet 600 mg  600 mg Oral Q12H Rogene Meth T, MD      . LORazepam (ATIVAN) injection 2 mg  2 mg Intramuscular Q4H PRN Merwyn KatosBradler, Evan K, MD   2 mg at 12/02/20 2307  . OLANZapine zydis (ZYPREXA) disintegrating tablet 20 mg  20 mg Oral QHS Pinkie Manger, Jackquline DenmarkJohn T, MD   20 mg at 12/05/20 2146  . risperiDONE microspheres (RISPERDAL CONSTA) injection 50 mg  50 mg Intramuscular Q14 Days Renesmay Nesbitt, Jackquline DenmarkJohn T, MD   50 mg at 12/06/20 1142   Current Outpatient Medications  Medication Sig Dispense Refill  . aspirin EC 81 MG EC tablet Take 1 tablet (81 mg total) by mouth daily. Swallow whole. 30 tablet 1  . benztropine (COGENTIN) 1 MG tablet Take 1 tablet (1 mg total) by mouth at bedtime. 30 tablet 1  . divalproex (DEPAKOTE) 500 MG DR tablet Take 1 tablet (500 mg  total) by mouth every 12 (twelve) hours. 60 tablet 1  . haloperidol (HALDOL) 10 MG tablet Take 10 mg by mouth at bedtime.    . haloperidol decanoate (HALDOL DECANOATE) 100 MG/ML injection Inject 1 mL (100 mg total) into the muscle every 28 (twenty-eight) days. 1 mL 1  . lithium carbonate (ESKALITH) 450 MG CR tablet Take 1 tablet (450 mg total) by mouth every 12 (twelve) hours. 60 tablet 1  . methocarbamol (ROBAXIN) 500 MG  tablet Take 1 tablet (500 mg total) by mouth every 6 (six) hours as needed for muscle spasms. 60 tablet 1  . OLANZapine zydis (ZYPREXA) 20 MG disintegrating tablet Take 1 tablet (20 mg total) by mouth at bedtime. 30 tablet 1  . risperiDONE (RISPERDAL M-TABS) 4 MG disintegrating tablet Take 1 tablet (4 mg total) by mouth at bedtime. 30 tablet 1  . risperiDONE microspheres (RISPERDAL CONSTA) 50 MG injection Inject 2 mLs (50 mg total) into the muscle every 14 (fourteen) days. 1 each 1    Musculoskeletal: Strength & Muscle Tone: within normal limits Gait & Station: normal Patient leans: N/A            Psychiatric Specialty Exam:  Presentation  General Appearance: No data recorded Eye Contact:No data recorded Speech:No data recorded Speech Volume:No data recorded Handedness:No data recorded  Mood and Affect  Mood:No data recorded Affect:No data recorded  Thought Process  Thought Processes:No data recorded Descriptions of Associations:No data recorded Orientation:No data recorded Thought Content:No data recorded History of Schizophrenia/Schizoaffective disorder:Yes  Duration of Psychotic Symptoms:Less than six months  Hallucinations:No data recorded Ideas of Reference:No data recorded Suicidal Thoughts:No data recorded Homicidal Thoughts:No data recorded  Sensorium  Memory:No data recorded Judgment:No data recorded Insight:No data recorded  Executive Functions  Concentration:No data recorded Attention Span:No data recorded Recall:No data  recorded Fund of Knowledge:No data recorded Language:No data recorded  Psychomotor Activity  Psychomotor Activity:No data recorded  Assets  Assets:No data recorded  Sleep  Sleep:No data recorded  Physical Exam: Physical Exam Vitals and nursing note reviewed.  Constitutional:      Appearance: Normal appearance.  HENT:     Head: Normocephalic and atraumatic.     Mouth/Throat:     Pharynx: Oropharynx is clear.  Eyes:     Pupils: Pupils are equal, round, and reactive to light.  Cardiovascular:     Rate and Rhythm: Normal rate and regular rhythm.  Pulmonary:     Effort: Pulmonary effort is normal.     Breath sounds: Normal breath sounds.  Abdominal:     General: Abdomen is flat.     Palpations: Abdomen is soft.  Musculoskeletal:        General: Normal range of motion.  Skin:    General: Skin is warm and dry.  Neurological:     General: No focal deficit present.     Mental Status: He is alert. Mental status is at baseline.  Psychiatric:        Attention and Perception: He is inattentive.        Mood and Affect: Mood normal. Affect is inappropriate.        Speech: He is noncommunicative. Speech is tangential.        Behavior: Behavior is agitated. Behavior is not aggressive or hyperactive.        Thought Content: Thought content is delusional.        Cognition and Memory: Cognition is impaired. Memory is impaired.        Judgment: Judgment is inappropriate.    Review of Systems  Constitutional: Negative.   HENT: Negative.   Eyes: Negative.   Respiratory: Negative.   Cardiovascular: Negative.   Gastrointestinal: Negative.   Musculoskeletal: Negative.   Skin: Negative.   Neurological: Negative.   Psychiatric/Behavioral: Negative for depression, hallucinations, memory loss, substance abuse and suicidal ideas. The patient is not nervous/anxious and does not have insomnia.    Blood pressure (!) 159/84, pulse (!) 103, temperature 98.6 F (37 C),  temperature source  Oral, resp. rate 16, height 5\' 11"  (1.803 m), weight 90.7 kg, SpO2 100 %. Body mass index is 27.89 kg/m.  Treatment Plan Summary: Daily contact with patient to assess and evaluate symptoms and progress in treatment, Medication management and Plan Patient at this point still presents as too psychotic and agitated to be responsibly discharged.  In his current condition it seems obvious that he would be brought back to the hospital and he remains at risk of injury to others just from his general agitation and hostility.  I have increased his Depakote and lithium doses both today.  He is on 3 different antipsychotics having been given his Risperdal constant injection today.  We have referred him out to hospitals and will continue to do so for now.  Disposition: Recommend psychiatric Inpatient admission when medically cleared.  , MD 12/06/2020 4:58 PM

## 2020-12-06 NOTE — ED Notes (Signed)
Pt given phone, pt informed on time allotted and agrees.

## 2020-12-07 DIAGNOSIS — F250 Schizoaffective disorder, bipolar type: Secondary | ICD-10-CM | POA: Diagnosis not present

## 2020-12-07 NOTE — ED Notes (Signed)
Patient banging on the window and door non stop and able to understand what patient wants , patient words are rambling and unclear at this time.

## 2020-12-07 NOTE — ED Notes (Signed)
Pt asleep at this time. Will try again for vitals later. 

## 2020-12-07 NOTE — ED Notes (Signed)
Dr, Toni Amend at bedside with pt, security present outside of room.

## 2020-12-07 NOTE — ED Notes (Signed)
Hourly rounding performed, patient currently asleep in room. Patient has no complaints at this time. Q15 minute rounds and monitoring via Security Cameras to continue. 

## 2020-12-07 NOTE — ED Notes (Signed)
Pt awake and knocking on window. Pt requesting to use the phone. Pt is aware that it is not phone hours at this time. Pt requested something to drink and was given juice. Pt asking about seeing MD and was informed that he will makes round later this morning.

## 2020-12-07 NOTE — ED Notes (Signed)
Pt offered supplies for shower, pt refusing to take shower at this time.

## 2020-12-07 NOTE — ED Notes (Signed)
Pt given meal tray and grape juice 

## 2020-12-07 NOTE — ED Notes (Signed)
Pt given meal tray, pt continues to knock on window. Pt requesting to use phone again, pt informed he cannot use the phone right now due to calling 911 earlier when he had the phone.

## 2020-12-07 NOTE — ED Notes (Signed)
Pt knocking on window, RN to door to speak to pt. Pt asking to use the phone to call his ride so that he can go home. Pt was informed that he was not going home today, when he asked why pt was informed that Dr. Toni Amend did not feel like he was ready to go home. Pt states that he has a legal doctor at home, pt was again informed that Dr. Toni Amend was not ready to send him home yet. Pt stated that he has a family and it was not fair for him to have to be here. Pt asking again to use the phone. Pt informed he may can use phone after lunch.

## 2020-12-07 NOTE — ED Notes (Signed)
Pt asking for lunch tray, pt informed that lunch has not come up yet. Pt informed as soon as meal trays come we will give him one. Pt asking to use phone again. Pt informed her is not able to use the phone at this time due to inappropriately calling 911 earlier today while on the phone.

## 2020-12-07 NOTE — ED Notes (Signed)
Pt knocking on window, RN to door to see what pt needed. Pt gave RN empty drink cup. Pt speech is jumbled. From what RN could understand, pt talking about his cousin, gas prices, and his grandmother. Pt talking about wanting to go home and getting his medications.  RN asked pt if he would take his depakote. Pt says he wants to go home and take all of his medications. Pt then started speaking again with jumbled speech. Pt informed RN will bring medication and phone to him shortly.

## 2020-12-07 NOTE — ED Provider Notes (Signed)
Emergency Medicine Observation Re-evaluation Note  Boyce SIMONE TUCKEY is a 30 y.o. male, seen on rounds today.  Pt initially presented to the ED for complaints of IVC  Currently, the patient is calm, no acute complaints.  Physical Exam  Blood pressure (!) 142/90, pulse (!) 114, temperature 98.3 F (36.8 C), temperature source Oral, resp. rate 18, height 5\' 11"  (1.803 m), weight 90.7 kg, SpO2 100 %. Physical Exam General: NAD Lungs: CTAB Psych: not agitated  ED Course / MDM  EKG:    I have reviewed the labs performed to date as well as medications administered while in observation.  Recent changes in the last 24 hours include no acute events overnight.    Plan  Current plan is for inpatient psych placement. Patient is under full IVC at this time.   , MD 12/07/20 [phone]

## 2020-12-07 NOTE — ED Notes (Signed)
IVC/Pending Placement 

## 2020-12-07 NOTE — ED Notes (Signed)
Medications administered without incident

## 2020-12-07 NOTE — ED Notes (Signed)
Pt continues to knock on window. Pt not as agitated as prior. Pt is able to be redirected at this time.

## 2020-12-07 NOTE — ED Notes (Signed)
Pt agreeable to taking medications at this time. Pt given medication and ginger ale to drink. Pt cooperative with RN at this time. Pt still with jumbled speech.

## 2020-12-07 NOTE — ED Notes (Signed)
Snack and shower supplies given at this time
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
## 2020-12-07 NOTE — ED Notes (Signed)
Pt beating on walls and window, RN tried to verbally deescalate pt, unable to be deescalated. Pt has PRN medication order, Dr. Toni Amend aware pt is being medication and agreeable that pt needs to be medicated.   Consulting civil engineer and extra security called for help

## 2020-12-08 NOTE — ED Provider Notes (Signed)
Emergency Medicine Observation Re-evaluation Note  Ernest Cook is a 30 y.o. male, seen on rounds today.  Pt initially presented to the ED for complaints of IVC Currently, the patient is sleeping comfortably.  Physical Exam  BP (!) 142/90 (BP Location: Left Arm)   Pulse (!) 114   Temp 98.3 F (36.8 C) (Oral)   Resp 18   Ht 5\' 11"  (1.803 m)   Wt 90.7 kg   SpO2 100%   BMI 27.89 kg/m  Physical Exam Constitutional:      Appearance: He is not ill-appearing or toxic-appearing.  HENT:     Head: Atraumatic.  Cardiovascular:     Comments: Well perfused Pulmonary:     Effort: Pulmonary effort is normal.  Abdominal:     General: There is no distension.  Musculoskeletal:        General: No deformity.  Skin:    Findings: No rash.  Neurological:     General: No focal deficit present.     Cranial Nerves: No cranial nerve deficit.      ED Course / MDM  EKG:   I have reviewed the labs performed to date as well as medications administered while in observation.  Recent changes in the last 24 hours include intermittent agitation, not always redirectable and requiring single dose of calming agents yesterday..  Plan  Current plan is for inpatient psychiatric care. Patient is under full IVC at this time.   , MD 12/08/20 [phone]

## 2020-12-08 NOTE — ED Notes (Signed)
VS not taken patient asleep 

## 2020-12-08 NOTE — BH Assessment (Signed)
Patient referred to [facility], pending review.    State Referral Form and supporting documentation completed and faxed to [facility].

## 2020-12-08 NOTE — ED Notes (Signed)
Patient knocking on the window and said " I am hungry". Patient given snack and beverage. He was calm and cooperative. He was compliant with his HS medications. He went back to sleep . No issues.

## 2020-12-08 NOTE — ED Notes (Signed)
Hourly rounding reveals patient in room. No complaints, stable, in no acute distress. Q15 minute rounds and monitoring via Security Cameras to continue. 

## 2020-12-08 NOTE — ED Notes (Signed)
Pt had breakfast early this morning with morning meds. Laid down for a bit more. Pt used the phone and seemed to be pleasant.  Shortly after he began knocking on the window, when asked what he needed he stated "im just a normal wanting to go home and do my life" Tech has trouble understanding exactly what pt is trying to communicate as his wording is out of order not understandable.  Pt received his lunch tray and vitals. Pt again asking to go home.

## 2020-12-08 NOTE — ED Notes (Signed)
Report to include situation, background, assessment and recommendations from Amber RN. Patient sleeping, respirations regular and unlabored. Q15 minute rounds and security camera observation to continue.    

## 2020-12-08 NOTE — ED Notes (Signed)
Patient asking multiple times to go home and speak with MD Clapacs. Patient speaks a lot about his animals at home.

## 2020-12-08 NOTE — ED Notes (Signed)
Pt given dinner tray.

## 2020-12-09 NOTE — BH Assessment (Signed)
Writer followed up with [facility] Referral ([phone]), pending review from medical team, for wait list.

## 2020-12-09 NOTE — ED Notes (Signed)
Hourly rounding reveals patient in room. No complaints, stable, in no acute distress. Q15 minute rounds and monitoring via Security Cameras to continue. 

## 2020-12-09 NOTE — ED Notes (Addendum)
Pt given phone att, reminded to NOT dial 911 or phone would be taken away. Pt also agrees understanding to 10 minute policy.

## 2020-12-09 NOTE — ED Notes (Signed)
Pt. Was given his lunch tray and a drink.

## 2020-12-09 NOTE — BH Assessment (Signed)
Contacted [facility] who reports referral was not received, this Clinical research associate re-sent information and compelted verbal screening. Information below  Patient referred to [facility], pending review.    State Referral Form and supporting documentation completed and faxed to [employer].   Verbal screening completed with [facility]([phone]), information faxed and confirmed it was received. Vincenza Hews reports referral will be sent to nurse for review. TTS to follow up

## 2020-12-09 NOTE — ED Notes (Signed)
Pt in a good mood today, talks about his animals at home and how he needs to go feed them. Pt very pleasant throughout interaction. Pts speech clear and understanding.

## 2020-12-09 NOTE — ED Notes (Signed)
Pt. Is asleep and his lunch tray is in the fridge.

## 2020-12-09 NOTE — ED Notes (Signed)
Pt given breakfast tray and juice. Obtained VS; no other needs voiced att.

## 2020-12-09 NOTE — ED Notes (Signed)
Pt very focused on leaving here today to go look after his animals. He agreed to take his meds after much persuasion from staff.

## 2020-12-09 NOTE — ED Notes (Signed)
Report to include Situation, Background, Assessment, and Recommendations received from Dorothy RN. Patient alert and oriented, warm and dry, in no acute distress. Patient denies SI, HI, AVH and pain. Patient made aware of Q15 minute rounds and security cameras for their safety. Patient instructed to come to me with needs or concerns.  

## 2020-12-09 NOTE — ED Notes (Signed)
Obtained phone from pt. 
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
## 2020-12-09 NOTE — ED Provider Notes (Signed)
Emergency Medicine Observation Re-evaluation Note  Harlin MACARTHUR LORUSSO is a 30 y.o. male, seen on rounds today.  Pt initially presented to the ED for complaints of IVC  Currently, the patient is calm, no acute complaints.  Physical Exam  Blood pressure (!) 118/94, pulse 83, temperature 98.5 F (36.9 C), temperature source Oral, resp. rate 18, height 5\' 11"  (1.803 m), weight 90.7 kg, SpO2 100 %. Physical Exam General: NAD Lungs: CTAB Psych: not agitated  ED Course / MDM  EKG:    I have reviewed the labs performed to date as well as medications administered while in observation.  Recent changes in the last 24 hours include no acute events overnight.    Plan  Current plan is for psych disposition. Patient is under full IVC at this time.   , MD 12/09/20 [phone]

## 2020-12-09 NOTE — ED Notes (Signed)
Pt requesting shower supplies and given. All trash cleaned from pt rm as well.

## 2020-12-10 NOTE — ED Notes (Signed)
VS not taken. Patient asleep 

## 2020-12-10 NOTE — ED Notes (Signed)
IVC pending placement 

## 2020-12-10 NOTE — ED Notes (Signed)
Hourly rounding reveals patient in room. No complaints, stable, in no acute distress. Q15 minute rounds and monitoring via Security Cameras to continue. 

## 2020-12-10 NOTE — ED Notes (Signed)
Offered patient to take a shower.  Patient refused stating "I'm supposed to go home today and I want to take a shower at home".

## 2020-12-10 NOTE — ED Notes (Signed)
Breakfast tray given.  Patient AAOx3.  Calm and cooperative.

## 2020-12-10 NOTE — ED Notes (Signed)
Dinner tray given to patient

## 2020-12-10 NOTE — ED Notes (Signed)
Remains cooperative, but is more agitated.  Raising voice, asking to see the doctor because "I need to go home before 3 o'clock".  Stating "You all are drugging me here, I used to get the [place] shot once a month, now you all are just drugging me". Reassurance given without effect.  Patient did take Depakote when asked.

## 2020-12-10 NOTE — ED Notes (Signed)
Pt. Alert and oriented, warm and dry, in no distress. Pt. Denies SI, HI, and AVH. Pt states he just wants to go home. Pt. Encouraged to let nursing staff know of any concerns or needs.  ENVIRONMENTAL ASSESSMENT Potentially harmful objects out of patient reach: Yes.   Personal belongings secured: Yes.   Patient dressed in hospital provided attire only: Yes.   Plastic bags out of patient reach: Yes.   Patient care equipment (cords, cables, call bells, lines, and drains) shortened, removed, or accounted for: Yes.   Equipment and supplies removed from bottom of stretcher: Yes.   Potentially toxic materials out of patient reach: Yes.   Sharps container removed or out of patient reach: Yes.

## 2020-12-10 NOTE — ED Notes (Signed)
Phone given to patient.  Patient voicing "I am ready to go home today.  I don't have time to waste here waiting for the doctor to come around"  Reassurance given  No obvious effect.  Patient remains cooperative.

## 2020-12-10 NOTE — ED Provider Notes (Signed)
Emergency Medicine Observation Re-evaluation Note  Ernest Cook is a 30 y.o. male, seen on rounds today.  Pt initially presented to the ED for complaints of IVC Currently, the patient is resting comfortably.  Physical Exam  BP 105/70 (BP Location: Right Arm)   Pulse 87   Temp 98.1 F (36.7 C) (Oral)   Resp 18   Ht 5\' 11"  (1.803 m)   Wt 90.7 kg   SpO2 99%   BMI 27.89 kg/m  Physical Exam General: No acute distress Cardiac: Well-perfused extremities Lungs: No respiratory distress Psych: Appropriate mood and affect  ED Course / MDM  EKG:   I have reviewed the labs performed to date as well as medications administered while in observation.  Recent changes in the last 24 hours include none.  Plan  Current plan is for psychiatric placement. Patient is under full IVC at this time.   , MD 12/10/20 [phone]

## 2020-12-11 NOTE — ED Notes (Signed)
Pt given the phone.   

## 2020-12-11 NOTE — ED Provider Notes (Signed)
Emergency Medicine Observation Re-evaluation Note  Kris HAREL REPETTO is a 30 y.o. male, seen on rounds today.  Pt initially presented to the ED for complaints of IVC Currently, the patient is resting comfortably.  Physical Exam  BP 111/76 (BP Location: Left Arm)   Pulse 89   Temp 98.1 F (36.7 C) (Oral)   Resp 16   Ht 5\' 11"  (1.803 m)   Wt 90.7 kg   SpO2 100%   BMI 27.89 kg/m  Physical Exam General: Laying in bed, alerts to voice, conversant, speaks of not wanting to get agitated in any further and trying to work to be a person Cardiac: Well-perfused extremities Lungs: No respiratory distress.  Speaks in full clear sentences Psych: Mood is calm, converses, talks about not wanting to get in fights any longer, wishes to be a [place] person.  ED Course / MDM  EKG:   I have reviewed the labs performed to date as well as medications administered while in observation.  Recent changes in the last 24 hours include none.  Plan  Current plan is for psychiatric placement. Patient is under full IVC at this time.     Regulatory affairs officer, MD 12/11/20 [phone]

## 2020-12-11 NOTE — ED Notes (Signed)
Meal tray given 

## 2020-12-11 NOTE — ED Notes (Signed)
Patient cursing at Clinical research associate stating, "I can stand you bitch. Get out of her." Writer had to convince patient to take night medications. Patient refused to do mouth check.

## 2020-12-11 NOTE — ED Notes (Signed)
Pt has been calm and cooperative throughout shift.  Pt is fixated on going home.  Pt states he has behaved and taken all his meds so he should be allowed to leave.  Rn explained to patent only the psychiatrist can make that decision.
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
## 2020-12-11 NOTE — ED Notes (Signed)
Unable to obtain vitals due to patient sleeping. Will continue to monitor.   

## 2020-12-12 DIAGNOSIS — F250 Schizoaffective disorder, bipolar type: Secondary | ICD-10-CM | POA: Diagnosis not present

## 2020-12-12 LAB — VALPROIC ACID LEVEL: Valproic Acid Lvl: 116 ug/mL — ABNORMAL HIGH (ref 50.0–100.0)

## 2020-12-12 LAB — LITHIUM LEVEL: Lithium Lvl: 0.58 mmol/L — ABNORMAL LOW (ref 0.60–1.20)

## 2020-12-12 MED ORDER — ASPIRIN 81 MG PO TBEC: 81.0000 mg | DELAYED_RELEASE_TABLET | Freq: Every day | ORAL | 1 refills | Status: AC

## 2020-12-12 MED ORDER — RISPERIDONE MICROSPHERES ER 50 MG IM SRER: 50.0000 mg | INTRAMUSCULAR | 1 refills | Status: AC

## 2020-12-12 MED ORDER — HALOPERIDOL 10 MG PO TABS: 10.0000 mg | ORAL_TABLET | Freq: Every day | ORAL | 1 refills | Status: AC

## 2020-12-12 MED ORDER — LITHIUM CARBONATE ER 300 MG PO TBCR: 600.0000 mg | EXTENDED_RELEASE_TABLET | Freq: Two times a day (BID) | ORAL | 1 refills | Status: AC

## 2020-12-12 MED ORDER — OLANZAPINE 20 MG PO TBDP: 20.0000 mg | ORAL_TABLET | Freq: Every day | ORAL | 1 refills | Status: AC

## 2020-12-12 MED ORDER — HALOPERIDOL DECANOATE 100 MG/ML IM SOLN: 100.0000 mg | INTRAMUSCULAR | 1 refills | Status: AC

## 2020-12-12 MED ORDER — DIVALPROEX SODIUM 500 MG PO DR TAB: 500.0000 mg | DELAYED_RELEASE_TABLET | Freq: Three times a day (TID) | ORAL | 1 refills | Status: AC

## 2020-12-12 NOTE — ED Notes (Signed)
Pt vented to this Clinical research associate about staff treating him poorly and wanting to go home.  Pt was agitated, but it was not directed at this RN.

## 2020-12-12 NOTE — ED Notes (Signed)
Pt given crackers, PB and milk.

## 2020-12-12 NOTE — BH Assessment (Signed)
Confirmed with [facility] ([phone]), patient is on their Waitlist.

## 2020-12-12 NOTE — ED Notes (Signed)
Pt aware he is being discharged and waiting for a ride from [facility].

## 2020-12-12 NOTE — ED Notes (Signed)
Pt given ginger ale. Pt has no other needs at this time. Will continue to monitor.

## 2020-12-12 NOTE — ED Notes (Signed)
IVC/Pending Placement 
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
## 2020-12-12 NOTE — ED Notes (Signed)
Pt given the phone.   

## 2020-12-12 NOTE — ED Notes (Signed)
Pt asking if he was still going to be leaving today.  RN examined the quad officer called [facility] ( a second time) to remind them he needs transport home.

## 2020-12-12 NOTE — ED Notes (Signed)
Patient is stable in NAD. He is calm and cooperative. He is discharged to home via office C. Toni Arthurs. Patient belongings and Discharge instruction given. Patient verbalized understanding. No issues.

## 2020-12-23 NOTE — ED Provider Notes (Signed)
[facility] Emergency Department Provider Note   ____________________________________________   Event Date/Time   First MD Initiated Contact with Patient 12/02/20 1830     (approximate)  I have reviewed the triage vital signs and the nursing notes.   HISTORY  Chief Complaint IVC    HPI Ernest Cook is a 30 y.o. male with the below stated past medical history the presents under IVC for aggressive and psychotic behavior.  Per IVC, patient was charged with trespassing in a business after being caught trying to steal items from a store and he was speaking in nonconfidential speech and was brought to our emergency department.  Further history and review of systems are unable to be obtained at this time given patient's mental status         Past Medical History:  Diagnosis Date  . Schizo affective schizophrenia [facility])     Patient Active Problem List   Diagnosis Date Noted  . Closed displaced comminuted fracture of shaft of right humerus 06/26/2020  . Broken arm, right, closed, initial encounter 06/25/2020  . Noncompliance 03/31/2016  . Cannabis use disorder, moderate, dependence (HCC) 03/26/2016  . Tobacco use disorder 03/26/2016  . Schizoaffective disorder, bipolar type (HCC) 03/25/2016  . Involuntary commitment 03/24/2016    Past Surgical History:  Procedure Laterality Date  . ORIF HUMERUS FRACTURE Right 06/27/2020   Procedure: OPEN REDUCTION INTERNAL FIXATION (ORIF) HUMERAL SHAFT FRACTURE;  Surgeon: Roby Lofts, MD;  Location: [facility] OR;  Service: Orthopedics;  Laterality: Right;  . SKIN GRAFT Left unknown   Pt reports left foot and leg skin graft    Prior to Admission medications   Medication Sig Start Date End Date Taking? Authorizing Provider  haloperidol (HALDOL) 10 MG tablet Take 1 tablet (10 mg total) by mouth at bedtime. 12/12/20  Yes Clapacs, Jackquline Denmark, MD  OLANZapine zydis (ZYPREXA) 20 MG disintegrating tablet Take 1 tablet (20 mg  total) by mouth at bedtime. 12/12/20  Yes Clapacs, Jackquline Denmark, MD  risperiDONE (RISPERDAL) 2 MG tablet Take 2 mg by mouth 2 (two) times daily. 11/22/20  Yes [provider]  aspirin EC 81 MG EC tablet Take 1 tablet (81 mg total) by mouth daily. Swallow whole. 12/13/20   Clapacs, Jackquline Denmark, MD  D3 SUPER STRENGTH 50 MCG (2000 UT) CAPS Take 1 capsule by mouth daily. 09/26/20   [provider]  divalproex (DEPAKOTE) 500 MG DR tablet Take 1 tablet (500 mg total) by mouth every 8 (eight) hours. 12/12/20   Clapacs, Jackquline Denmark, MD  haloperidol decanoate (HALDOL DECANOATE) 100 MG/ML injection Inject 1 mL (100 mg total) into the muscle every 28 (twenty-eight) days. 12/12/20   Clapacs, Jackquline Denmark, MD  lithium carbonate (LITHOBID) 300 MG CR tablet Take 2 tablets (600 mg total) by mouth every 12 (twelve) hours. 12/12/20   Clapacs, Jackquline Denmark, MD  risperiDONE microspheres (RISPERDAL CONSTA) 50 MG injection Inject 2 mLs (50 mg total) into the muscle every 14 (fourteen) days. 12/20/20   Clapacs, Jackquline Denmark, MD    Allergies Penicillins  Family History  Problem Relation Age of Onset  . Diabetes Mother   . Hypertension Mother     Social History Social History   Tobacco Use  . Smoking status: Current Every Day Smoker    Packs/day: 0.25    Types: Cigarettes  . Smokeless tobacco: Never Used  Substance Use Topics  . Alcohol use: No  . Drug use: Yes    Types: Marijuana  Comment: "every now and again"    Review of Systems Unable to assess ____________________________________________   PHYSICAL EXAM:  VITAL SIGNS: ED Triage Vitals  Enc Vitals Group     BP 12/02/20 1823 125/85     Pulse Rate 12/02/20 1823 79     Resp 12/02/20 1823 20     Temp 12/02/20 1823 98.2 F (36.8 C)     Temp Source 12/02/20 1823 Oral     SpO2 12/02/20 1823 100 %     Weight 12/02/20 1824 200 lb (90.7 kg)     Height 12/02/20 1824 5\' 11"  (1.803 m)     Head Circumference --      Peak Flow --      Pain Score 12/02/20 1824 0     Pain  Loc --      Pain Edu? --      Excl. in GC? --    Constitutional: Alert and disn oriented. Well appearing and in no acute distress. Eyes: Conjunctivae are normal. PERRL. Head: Atraumatic. Nose: No congestion/rhinnorhea. Mouth/Throat: Mucous membranes are moist. Neck: No stridor Cardiovascular: Grossly normal heart sounds.  Good peripheral circulation. Respiratory: Normal respiratory effort.  No retractions. Gastrointestinal: Soft and nontender. No distention. Musculoskeletal: No obvious deformities Neurologic:  Normal speech and language. No gross focal neurologic deficits are appreciated. Skin:  Skin is warm and dry. No rash noted. Psychiatric: Patient is agitated and uncooperative  ____________________________________________   LABS (all labs ordered are listed, but only abnormal results are displayed)  Labs Reviewed  COMPREHENSIVE METABOLIC PANEL - Abnormal; Notable for the following components:      Result Value   Glucose, Bld 118 (*)    Total Protein 6.4 (*)    All other components within normal limits  CBC WITH DIFFERENTIAL/PLATELET - Abnormal; Notable for the following components:   WBC 12.7 (*)    Hemoglobin 12.6 (*)    RDW 16.1 (*)    Neutro Abs 8.4 (*)    Monocytes Absolute 1.2 (*)    All other components within normal limits  ACETAMINOPHEN LEVEL - Abnormal; Notable for the following components:   Acetaminophen (Tylenol), Serum <10 (*)    All other components within normal limits  SALICYLATE LEVEL - Abnormal; Notable for the following components:   Salicylate Lvl <7.0 (*)    All other components within normal limits  LITHIUM LEVEL - Abnormal; Notable for the following components:   Lithium Lvl 0.50 (*)    All other components within normal limits  LITHIUM LEVEL - Abnormal; Notable for the following components:   Lithium Lvl 0.58 (*)    All other components within normal limits  VALPROIC ACID LEVEL - Abnormal; Notable for the following components:   Valproic  Acid Lvl 116 (*)    All other components within normal limits  RESP PANEL BY RT-PCR (FLU A&B, COVID) ARPGX2  URINE DRUG SCREEN, QUALITATIVE ([facility] ONLY)  ETHANOL  VALPROIC ACID LEVEL    PROCEDURES  Procedure(s) performed (including Critical Care):  Procedures   ____________________________________________   INITIAL IMPRESSION / ASSESSMENT AND PLAN / ED COURSE  As part of my medical decision making, I reviewed the following data within the electronic MEDICAL RECORD NUMBER Nursing notes reviewed and incorporated, Labs reviewed, EKG interpreted, Old chart reviewed, Radiograph reviewed and Notes from prior ED visits reviewed and incorporated        Patient presents under IVC for hallucinations/delusions. Thoughts are disorganized. No history of prior suicide attempt, and no SI or HI at  this time. Clinically w/ no overt toxidrome, low suspicion for ingestion given hx and exam Thoughts unlikely 2/2 anemia, hypothyroidism, infection, or ICH. Patients decision making capacity is compromised and they are unable to perform all ADLs (additionally they are without appropriate caretakers to assist through this deficit).  Consult: Psychiatry to evaluate patient for grave disability Disposition: Pending psychiatric evaluation  Care of this patient will be signed out the oncoming physician.  All pertinent patient formation is conveyed and all questions answered.  All further care and disposition decisions will be made by the oncoming physician.      ____________________________________________   FINAL CLINICAL IMPRESSION(S) / ED DIAGNOSES  Final diagnoses:  Acute psychosis [facility])     ED Discharge Orders         Ordered    risperiDONE microspheres (RISPERDAL CONSTA) 50 MG injection  Every 14 days        12/12/20 1200    aspirin EC 81 MG EC tablet  Daily        12/12/20 1200    haloperidol (HALDOL) 10 MG tablet  Daily at bedtime        12/12/20 1200    haloperidol decanoate (HALDOL  DECANOATE) 100 MG/ML injection  Every 28 days        12/12/20 1200    lithium carbonate (LITHOBID) 300 MG CR tablet  Every 12 hours        12/12/20 1200    OLANZapine zydis (ZYPREXA) 20 MG disintegrating tablet  Daily at bedtime        12/12/20 1200    divalproex (DEPAKOTE) 500 MG DR tablet  Every 8 hours        12/12/20 1200           Note:  This document was prepared using Dragon voice recognition software and may include unintentional dictation errors.   Merwyn Katos, MD 12/23/20 [phone]

## 2022-09-14 IMAGING — RF DG C-ARM 1-60 MIN
1 series · 7 of 7 positions shown · non-contrast
Comparison: Right humerus radiographs-06/24/2020

CLINICAL DATA: ORIF right humerus.

EXAM:
RIGHT HUMERUS - 2+ VIEW; DG C-ARM 1-60 MIN

[Series 1: run · 7 of 7 slices shown]
[im 1/7]
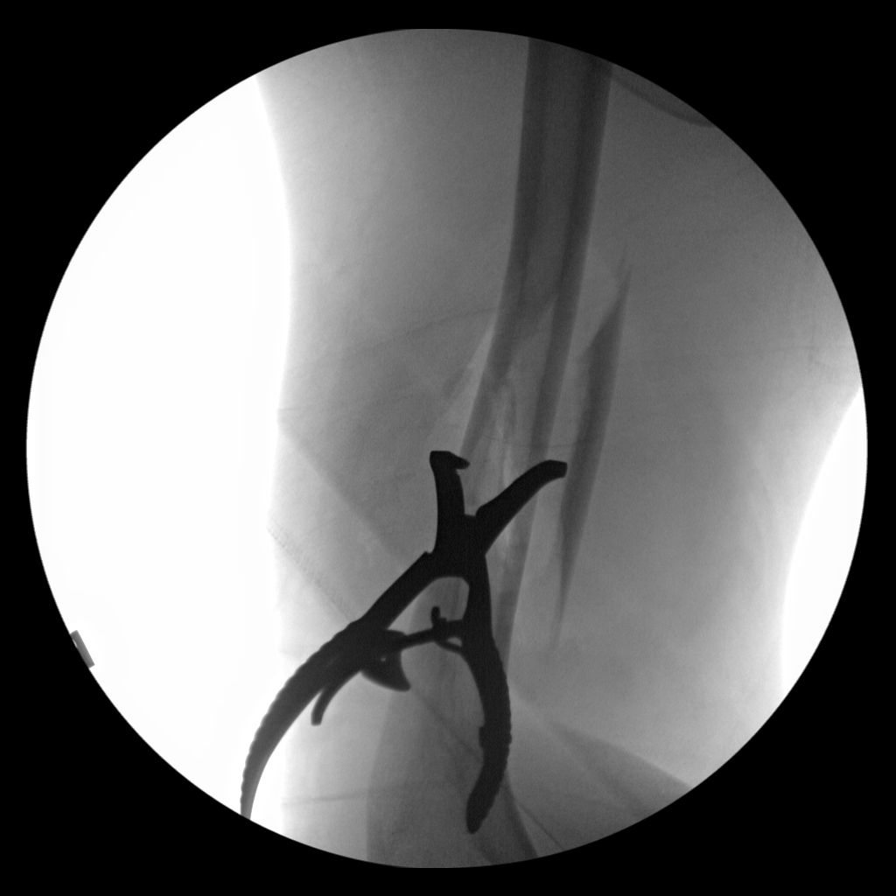
[im 2/7]
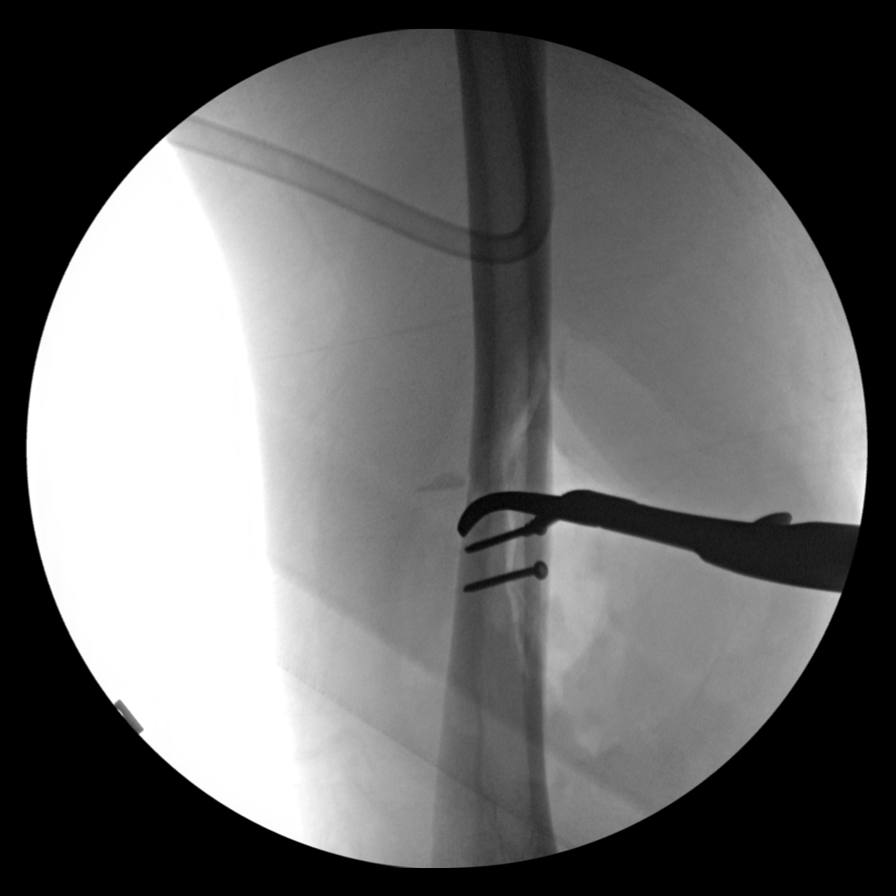
[im 3/7]
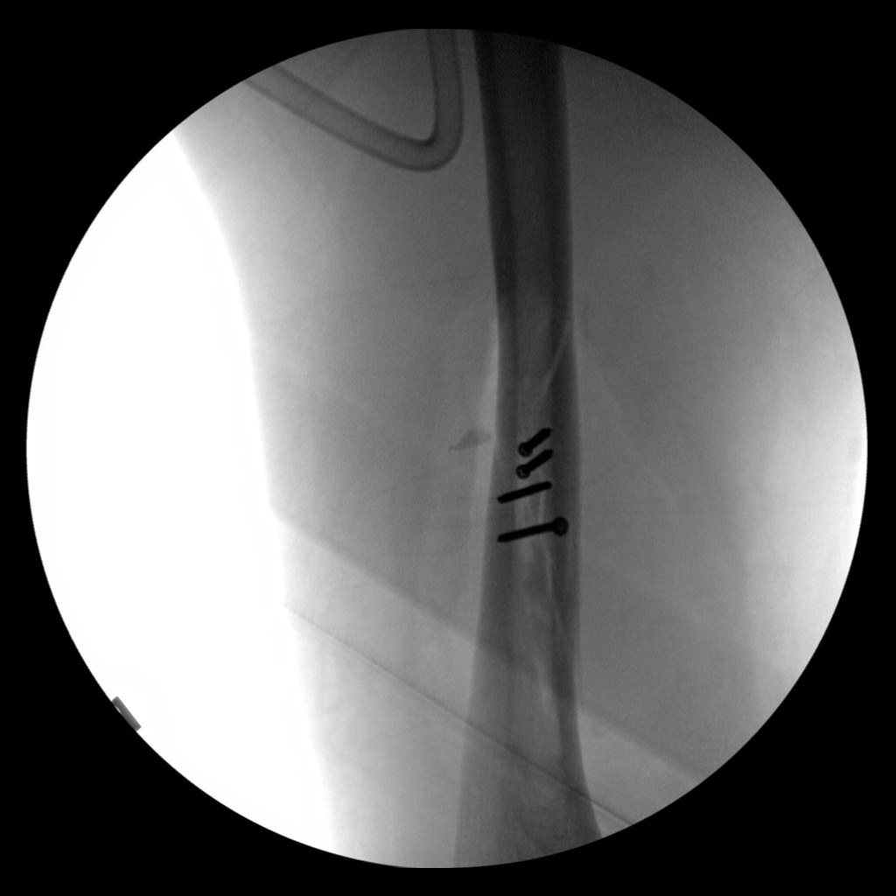
[im 4/7]
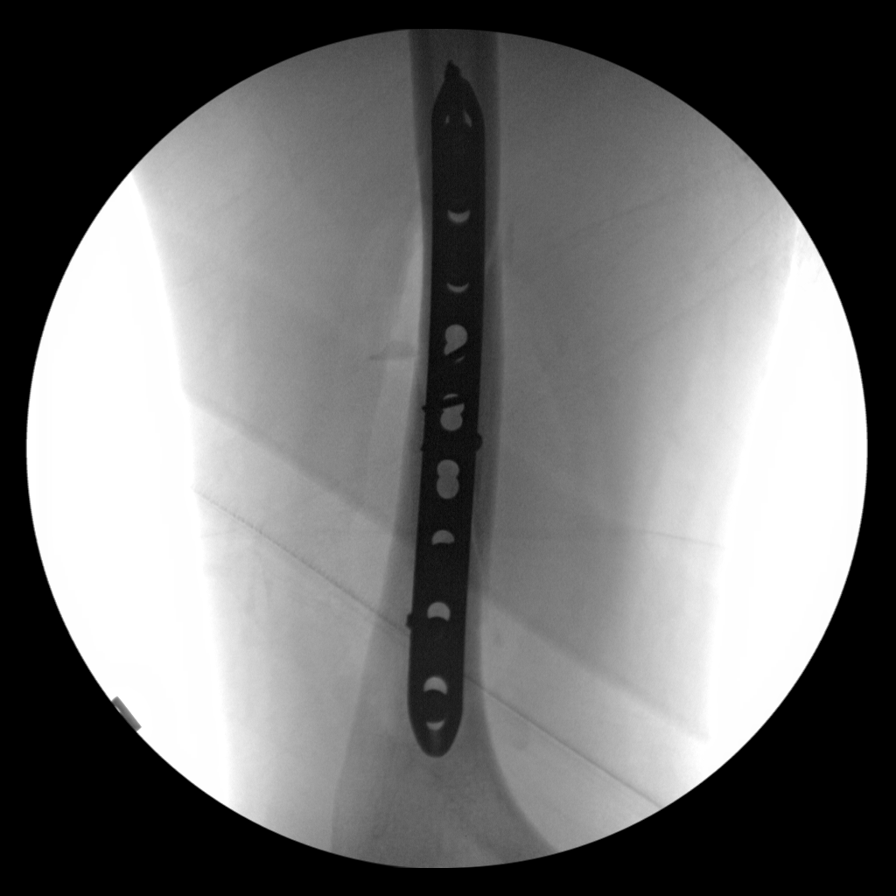
[im 5/7]
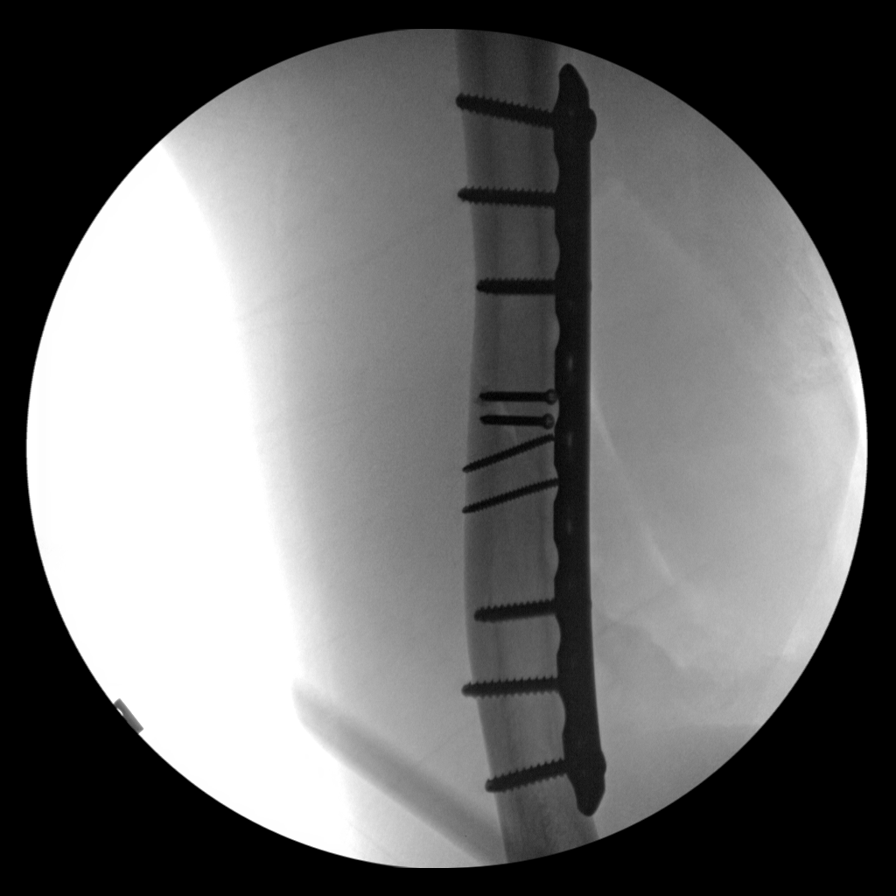
[im 6/7]
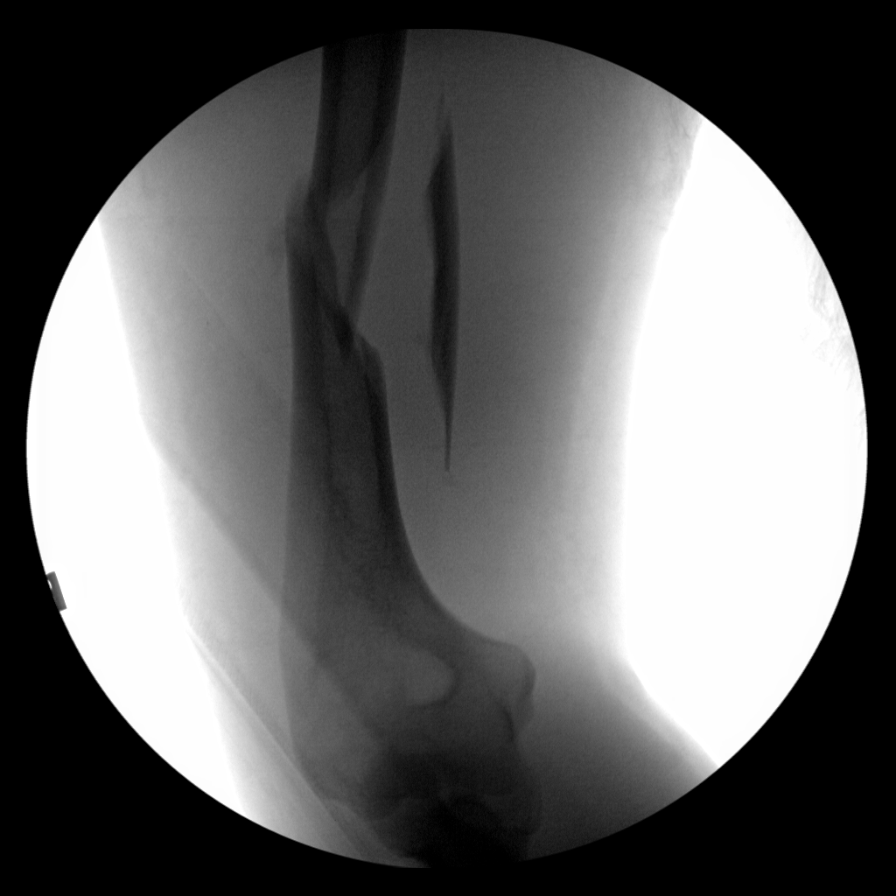
[im 7/7]
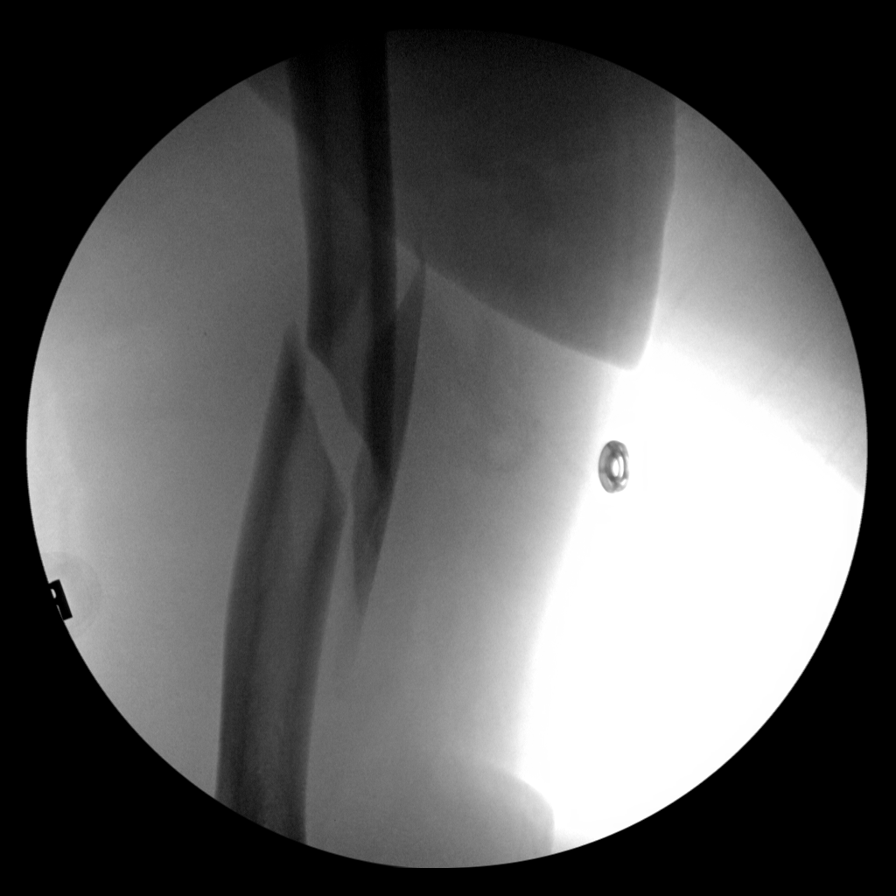

[7 of 7 positions shown; findings below may reference images not displayed]

FINDINGS: 7 spot intraoperative fluoroscopic images of the right humerus
during ORIF with side plate fixation and placement of 4 additional
cancellous screws at the main fracture site.

Completion imaging demonstrates anatomic positioning with tiny
residual displaced fracture fragment.

Expected adjacent soft tissue swelling.  No radiopaque foreign body.
IMPRESSION: Post ORIF of the right humerus without evidence of complication.

## 2022-09-14 IMAGING — DX DG HUMERUS 2V *R*
2 series · 2 of 2 positions shown · non-contrast
Comparison: 06/24/2020

CLINICAL DATA: Right humerus ORIF

EXAM:
RIGHT HUMERUS - 2+ VIEW

[humerus lat (1 of 2)]
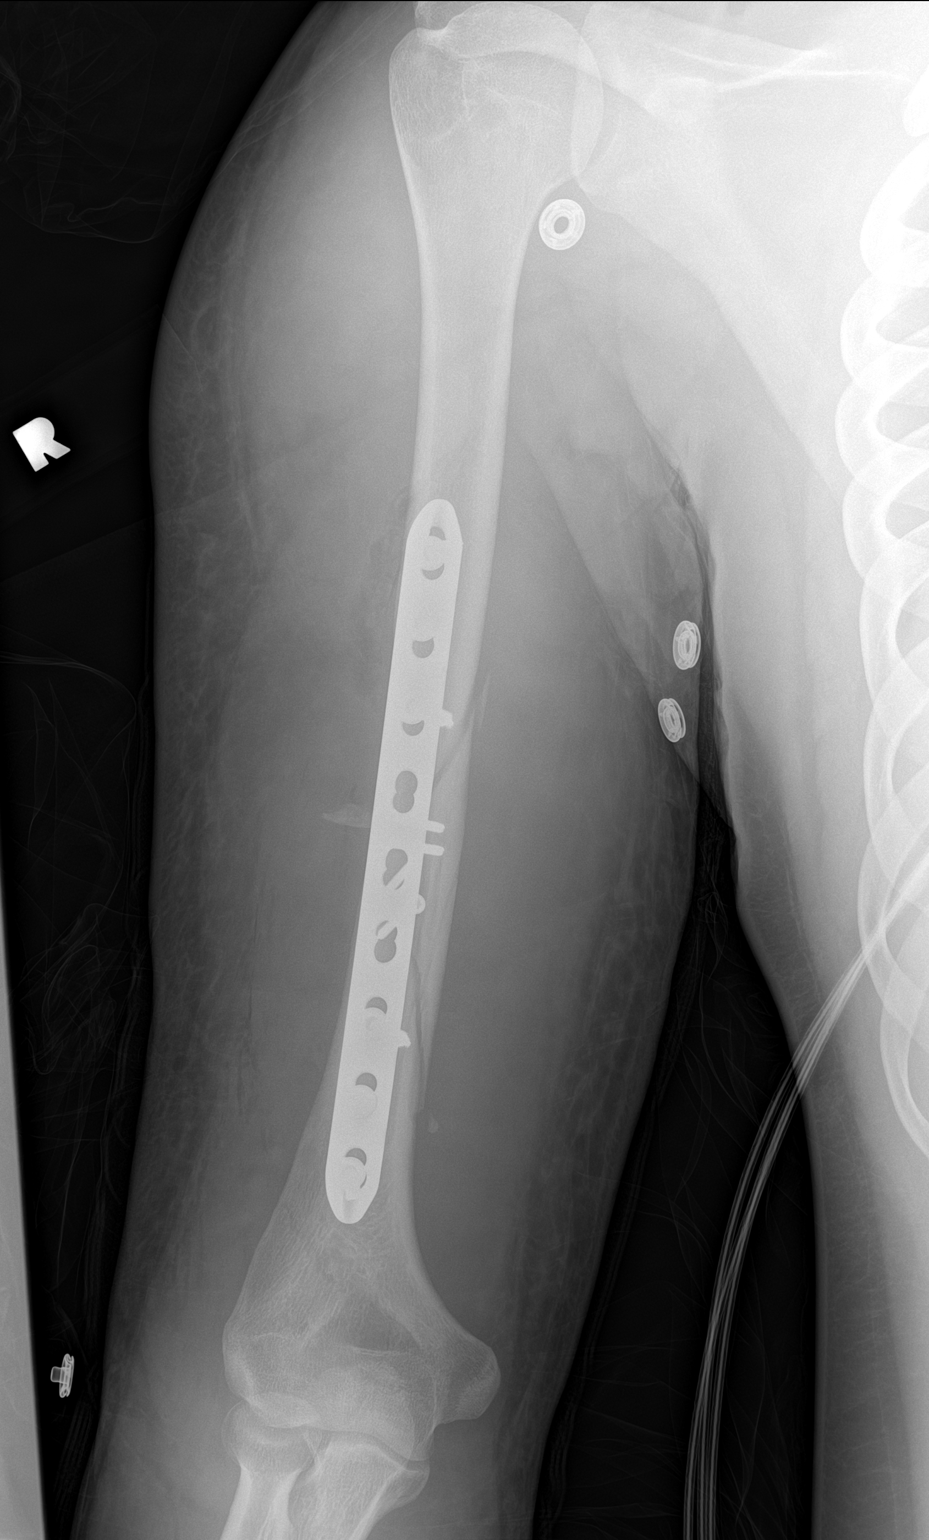

[humerus lat (2 of 2)]
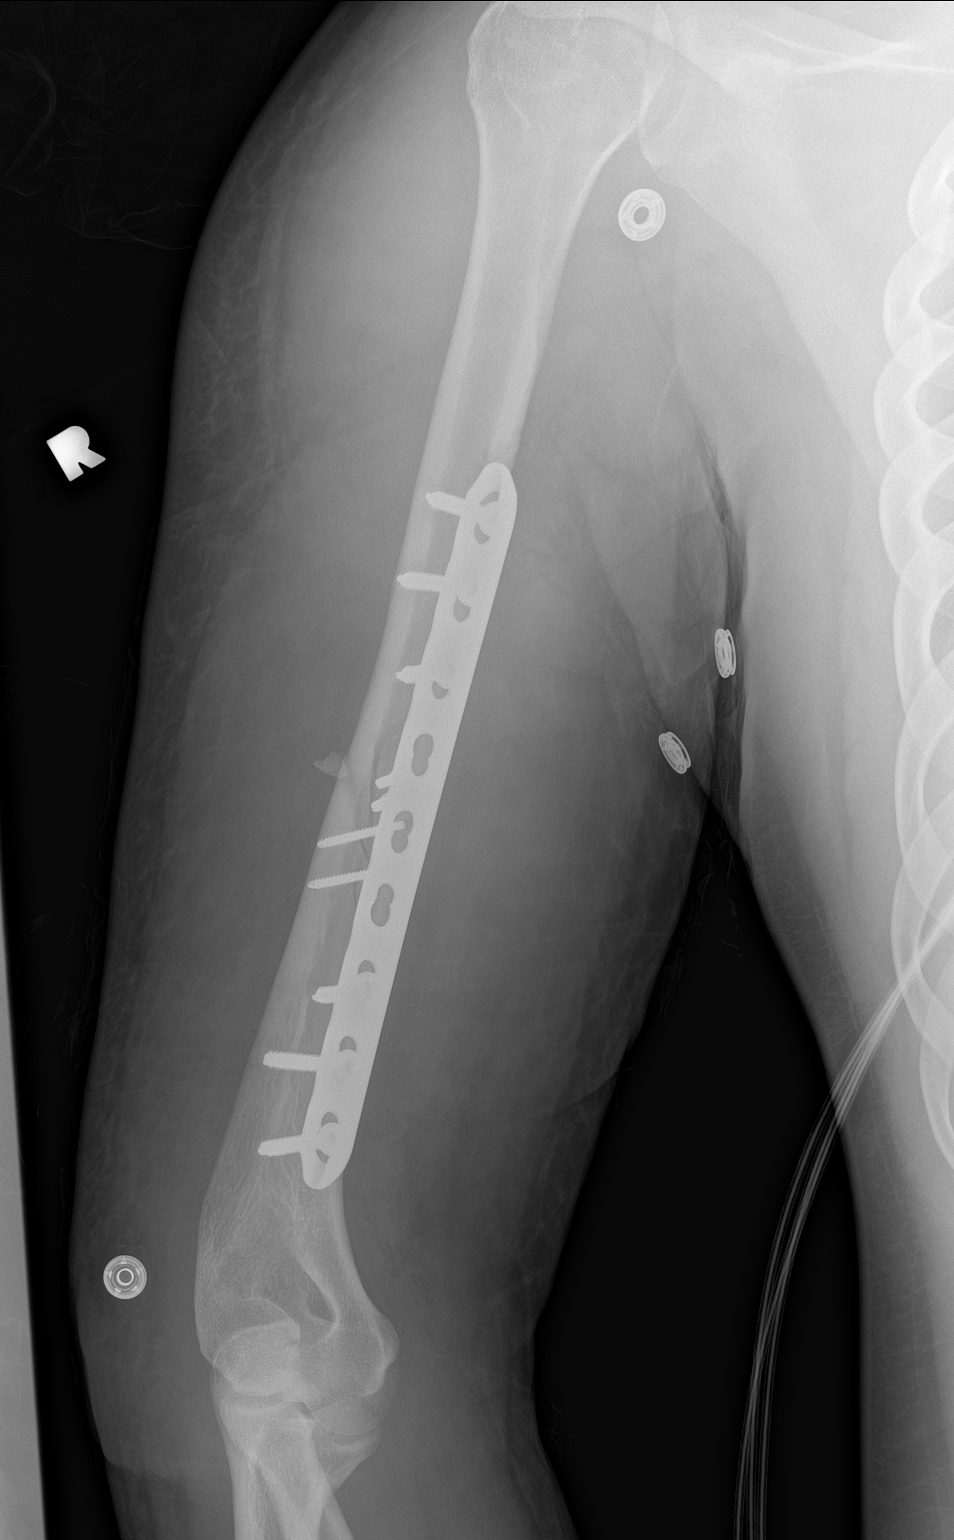

[2 of 2 positions shown; findings below may reference images not displayed]

FINDINGS: Status post ORIF of right humeral diaphyseal fracture via sideplate
and screw fixation construct. Improved fracture alignment, now near
anatomic. Approximately 10 cm butterfly fragment is also now in near
anatomic alignment. There is a smaller approximately 1.2 cm fracture
fragment along the lateral margin of the fracture line which is
slightly laterally displaced. Alignment at the shoulder and elbow
appear anatomic. Diffuse soft tissue swelling at the
fracture/surgical site.
IMPRESSION: Status post ORIF of right humeral diaphyseal fracture, now in near
anatomic alignment.

## 2022-10-17 DEATH — deceased
